# Patient Record
Sex: Male | Born: 1975 | Race: White | Hispanic: No | Marital: Single | Smoking: Former smoker
Health system: Southern US, Community
[De-identification: ages and names within clinical notes are randomized; demographics above are authoritative.]

## PROBLEM LIST (undated history)

## (undated) ENCOUNTER — Emergency Department (HOSPITAL_COMMUNITY): Admission: EM | Payer: No Typology Code available for payment source | Source: Home / Self Care

## (undated) DIAGNOSIS — M549 Dorsalgia, unspecified: Secondary | ICD-10-CM

## (undated) HISTORY — PX: OTHER SURGICAL HISTORY: SHX169

## (undated) HISTORY — PX: HAND SURGERY: SHX662

## (undated) HISTORY — PX: FRACTURE SURGERY: SHX138

## (undated) HISTORY — PX: ORTHOPEDIC SURGERY: SHX850

---

## 2006-04-30 ENCOUNTER — Emergency Department (HOSPITAL_COMMUNITY): Admission: EM | Admit: 2006-04-30 | Discharge: 2006-04-30 | Payer: Self-pay | Admitting: Emergency Medicine

## 2008-06-21 ENCOUNTER — Emergency Department (HOSPITAL_COMMUNITY): Admission: EM | Admit: 2008-06-21 | Discharge: 2008-06-21 | Payer: Self-pay | Admitting: Emergency Medicine

## 2009-07-22 ENCOUNTER — Emergency Department (HOSPITAL_COMMUNITY): Admission: EM | Admit: 2009-07-22 | Discharge: 2009-07-22 | Payer: Self-pay | Admitting: Emergency Medicine

## 2009-07-25 ENCOUNTER — Emergency Department (HOSPITAL_COMMUNITY): Admission: EM | Admit: 2009-07-25 | Discharge: 2009-07-26 | Payer: Self-pay | Admitting: Emergency Medicine

## 2009-12-11 ENCOUNTER — Emergency Department (HOSPITAL_COMMUNITY): Admission: EM | Admit: 2009-12-11 | Discharge: 2009-12-11 | Payer: Self-pay | Admitting: Emergency Medicine

## 2009-12-27 ENCOUNTER — Emergency Department (HOSPITAL_COMMUNITY): Admission: EM | Admit: 2009-12-27 | Discharge: 2009-12-27 | Payer: Self-pay | Admitting: Emergency Medicine

## 2010-01-27 ENCOUNTER — Emergency Department (HOSPITAL_COMMUNITY): Admission: EM | Admit: 2010-01-27 | Discharge: 2010-01-27 | Payer: Self-pay | Admitting: Emergency Medicine

## 2010-02-25 ENCOUNTER — Emergency Department (HOSPITAL_COMMUNITY): Admission: EM | Admit: 2010-02-25 | Discharge: 2010-02-25 | Payer: Self-pay | Admitting: Emergency Medicine

## 2010-03-25 ENCOUNTER — Emergency Department (HOSPITAL_COMMUNITY): Admission: EM | Admit: 2010-03-25 | Discharge: 2010-03-25 | Payer: Self-pay | Admitting: Emergency Medicine

## 2010-03-27 ENCOUNTER — Emergency Department (HOSPITAL_COMMUNITY): Admission: EM | Admit: 2010-03-27 | Discharge: 2010-03-27 | Payer: Self-pay | Admitting: Emergency Medicine

## 2010-04-03 ENCOUNTER — Emergency Department (HOSPITAL_COMMUNITY): Admission: EM | Admit: 2010-04-03 | Discharge: 2010-04-03 | Payer: Self-pay | Admitting: Emergency Medicine

## 2010-06-19 ENCOUNTER — Emergency Department (HOSPITAL_COMMUNITY): Admission: EM | Admit: 2010-06-19 | Discharge: 2010-06-19 | Payer: Self-pay | Admitting: Emergency Medicine

## 2010-07-10 ENCOUNTER — Emergency Department (HOSPITAL_COMMUNITY): Admission: EM | Admit: 2010-07-10 | Discharge: 2010-07-10 | Payer: Self-pay | Admitting: Emergency Medicine

## 2010-07-16 ENCOUNTER — Emergency Department (HOSPITAL_COMMUNITY): Admission: EM | Admit: 2010-07-16 | Discharge: 2010-07-16 | Payer: Self-pay | Admitting: Emergency Medicine

## 2010-08-12 ENCOUNTER — Emergency Department (HOSPITAL_COMMUNITY): Admission: EM | Admit: 2010-08-12 | Discharge: 2010-08-12 | Payer: Self-pay | Admitting: Emergency Medicine

## 2010-08-24 ENCOUNTER — Emergency Department (HOSPITAL_COMMUNITY): Admission: EM | Admit: 2010-08-24 | Discharge: 2010-08-24 | Payer: Self-pay | Admitting: Emergency Medicine

## 2010-09-21 ENCOUNTER — Emergency Department (HOSPITAL_COMMUNITY): Admission: EM | Admit: 2010-09-21 | Discharge: 2010-09-21 | Payer: Self-pay | Admitting: Emergency Medicine

## 2010-10-09 ENCOUNTER — Emergency Department (HOSPITAL_COMMUNITY)
Admission: EM | Admit: 2010-10-09 | Discharge: 2010-10-09 | Payer: Self-pay | Source: Home / Self Care | Admitting: Emergency Medicine

## 2010-10-14 ENCOUNTER — Emergency Department (HOSPITAL_COMMUNITY)
Admission: EM | Admit: 2010-10-14 | Discharge: 2010-10-14 | Payer: Self-pay | Source: Home / Self Care | Admitting: Emergency Medicine

## 2010-10-31 ENCOUNTER — Emergency Department (HOSPITAL_COMMUNITY)
Admission: EM | Admit: 2010-10-31 | Discharge: 2010-10-31 | Payer: Self-pay | Source: Home / Self Care | Admitting: Emergency Medicine

## 2010-11-30 ENCOUNTER — Emergency Department (HOSPITAL_COMMUNITY)
Admission: EM | Admit: 2010-11-30 | Discharge: 2010-11-30 | Payer: Self-pay | Source: Home / Self Care | Admitting: Emergency Medicine

## 2010-12-01 ENCOUNTER — Emergency Department (HOSPITAL_COMMUNITY)
Admission: EM | Admit: 2010-12-01 | Discharge: 2010-12-01 | Payer: Self-pay | Source: Home / Self Care | Admitting: Emergency Medicine

## 2010-12-08 ENCOUNTER — Emergency Department (HOSPITAL_COMMUNITY)
Admission: EM | Admit: 2010-12-08 | Discharge: 2010-12-08 | Payer: Self-pay | Source: Home / Self Care | Admitting: Emergency Medicine

## 2010-12-21 ENCOUNTER — Emergency Department (HOSPITAL_COMMUNITY)
Admission: EM | Admit: 2010-12-21 | Discharge: 2010-12-21 | Disposition: A | Discharge status: Home / Self Care | Payer: Self-pay | Attending: Emergency Medicine | Admitting: Emergency Medicine

## 2010-12-21 DIAGNOSIS — K029 Dental caries, unspecified: Secondary | ICD-10-CM | POA: Insufficient documentation

## 2010-12-21 DIAGNOSIS — K089 Disorder of teeth and supporting structures, unspecified: Secondary | ICD-10-CM | POA: Insufficient documentation

## 2011-02-09 LAB — POCT I-STAT, CHEM 8
BUN: 12 mg/dL (ref 6–23)
Calcium, Ion: 1.04 mmol/L — ABNORMAL LOW (ref 1.12–1.32)
Creatinine, Ser: 1.6 mg/dL — ABNORMAL HIGH (ref 0.4–1.5)
TCO2: 28 mmol/L (ref 0–100)

## 2011-02-09 LAB — URINALYSIS, ROUTINE W REFLEX MICROSCOPIC
Glucose, UA: NEGATIVE mg/dL
Hgb urine dipstick: NEGATIVE
Ketones, ur: NEGATIVE mg/dL
Protein, ur: NEGATIVE mg/dL

## 2011-03-01 ENCOUNTER — Emergency Department (HOSPITAL_COMMUNITY)
Admission: EM | Admit: 2011-03-01 | Discharge: 2011-03-01 | Disposition: A | Discharge status: Home / Self Care | Payer: Self-pay | Attending: Emergency Medicine | Admitting: Emergency Medicine

## 2011-03-01 DIAGNOSIS — K029 Dental caries, unspecified: Secondary | ICD-10-CM | POA: Insufficient documentation

## 2011-03-01 DIAGNOSIS — K089 Disorder of teeth and supporting structures, unspecified: Secondary | ICD-10-CM | POA: Insufficient documentation

## 2011-04-19 ENCOUNTER — Emergency Department (HOSPITAL_COMMUNITY)
Admission: EM | Admit: 2011-04-19 | Discharge: 2011-04-19 | Disposition: A | Discharge status: Home / Self Care | Payer: Self-pay | Attending: Emergency Medicine | Admitting: Emergency Medicine

## 2011-04-19 DIAGNOSIS — L0201 Cutaneous abscess of face: Secondary | ICD-10-CM | POA: Insufficient documentation

## 2011-04-19 DIAGNOSIS — L03211 Cellulitis of face: Secondary | ICD-10-CM | POA: Insufficient documentation

## 2011-04-21 ENCOUNTER — Emergency Department (HOSPITAL_COMMUNITY)
Admission: EM | Admit: 2011-04-21 | Discharge: 2011-04-21 | Disposition: A | Discharge status: Home / Self Care | Payer: Self-pay | Attending: Emergency Medicine | Admitting: Emergency Medicine

## 2011-04-21 DIAGNOSIS — L0201 Cutaneous abscess of face: Secondary | ICD-10-CM | POA: Insufficient documentation

## 2011-04-21 DIAGNOSIS — L03211 Cellulitis of face: Secondary | ICD-10-CM | POA: Insufficient documentation

## 2011-04-21 DIAGNOSIS — F17200 Nicotine dependence, unspecified, uncomplicated: Secondary | ICD-10-CM | POA: Insufficient documentation

## 2011-04-23 ENCOUNTER — Emergency Department (HOSPITAL_COMMUNITY)
Admission: EM | Admit: 2011-04-23 | Discharge: 2011-04-23 | Disposition: A | Discharge status: Home / Self Care | Payer: Self-pay | Attending: Emergency Medicine | Admitting: Emergency Medicine

## 2011-04-23 DIAGNOSIS — Z4800 Encounter for change or removal of nonsurgical wound dressing: Secondary | ICD-10-CM | POA: Insufficient documentation

## 2011-06-26 ENCOUNTER — Encounter: Payer: Self-pay | Admitting: *Deleted

## 2011-06-26 ENCOUNTER — Emergency Department (HOSPITAL_COMMUNITY)
Admission: EM | Admit: 2011-06-26 | Discharge: 2011-06-26 | Disposition: A | Discharge status: Home / Self Care | Payer: Worker's Compensation | Attending: Emergency Medicine | Admitting: Emergency Medicine

## 2011-06-26 ENCOUNTER — Emergency Department (HOSPITAL_COMMUNITY): Payer: Worker's Compensation

## 2011-06-26 DIAGNOSIS — W108XXA Fall (on) (from) other stairs and steps, initial encounter: Secondary | ICD-10-CM | POA: Insufficient documentation

## 2011-06-26 DIAGNOSIS — F17200 Nicotine dependence, unspecified, uncomplicated: Secondary | ICD-10-CM | POA: Insufficient documentation

## 2011-06-26 DIAGNOSIS — S3992XA Unspecified injury of lower back, initial encounter: Secondary | ICD-10-CM

## 2011-06-26 DIAGNOSIS — IMO0002 Reserved for concepts with insufficient information to code with codable children: Secondary | ICD-10-CM | POA: Insufficient documentation

## 2011-06-26 DIAGNOSIS — T148XXA Other injury of unspecified body region, initial encounter: Secondary | ICD-10-CM

## 2011-06-26 MED ORDER — OXYCODONE-ACETAMINOPHEN 5-325 MG PO TABS
1.0000 | ORAL_TABLET | Freq: Once | ORAL | Status: AC
  Administered 2011-06-26: 1 via ORAL
  Filled 2011-06-26: qty 1

## 2011-06-26 MED ORDER — KETOROLAC TROMETHAMINE 60 MG/2ML IM SOLN
60.0000 mg | Freq: Once | INTRAMUSCULAR | Status: AC
  Administered 2011-06-26: 60 mg via INTRAMUSCULAR
  Filled 2011-06-26: qty 2

## 2011-06-26 MED ORDER — IBUPROFEN 800 MG PO TABS: 800.0000 mg | ORAL_TABLET | Freq: Three times a day (TID) | ORAL | Status: AC

## 2011-06-26 MED ORDER — OXYCODONE-ACETAMINOPHEN 5-325 MG PO TABS: 1.0000 | ORAL_TABLET | ORAL | Status: AC | PRN

## 2011-06-26 NOTE — ED Notes (Signed)
Pt states slipped on steps falling backwards hitting lower back on edge of steps.  C/o pain to lower back.  C/o numbness to lower back, states is going away now.  Also c/o pain to right side of collarbone.

## 2011-06-26 NOTE — ED Provider Notes (Signed)
History     CSN: [id number] Arrival date & time: 06/26/2011  3:04 PM  Chief Complaint  Patient presents with  . Back Pain   Patient is a 35 y.o. male presenting with back pain. The history is provided by the patient.  Back Pain  This is a new problem. The current episode started 3 to 5 hours ago. The problem occurs constantly. The problem has not changed since onset.The pain is associated with falling (He slipped going down a step,  landing with his lower back against the step edge.  He also has pain in his right collarbone,  as right arm was hyperextended as he caught his fall.  He denies arm and hand pain.  He did not hit his head). The pain is present in the lumbar spine. The quality of the pain is described as aching. The pain does not radiate. The pain is at a severity of 10/10. The pain is severe. Exacerbated by: Lying flat back pain worse. Pertinent negatives include no chest pain, no fever, no numbness, no headaches, no abdominal pain, no dysuria, no leg pain, no paresthesias, no paresis, no tingling and no weakness. He has tried nothing for the symptoms.    History reviewed. No pertinent past medical history.  Past Surgical History  Procedure Date  . Collarbone     No family history on file.  History  Substance Use Topics  . Smoking status: Current Everyday Smoker  . Smokeless tobacco: Not on file  . Alcohol Use: No      Review of Systems  Constitutional: Negative for fever.  HENT: Negative for congestion, sore throat and neck pain.   Eyes: Negative.   Respiratory: Negative for chest tightness and shortness of breath.   Cardiovascular: Negative for chest pain.  Gastrointestinal: Negative for nausea and abdominal pain.  Genitourinary: Negative.  Negative for dysuria.  Musculoskeletal: Positive for back pain. Negative for joint swelling and arthralgias.  Skin: Negative.  Negative for rash and wound.  Neurological: Negative for dizziness, tingling, weakness,  light-headedness, numbness, headaches and paresthesias.  Hematological: Negative.   Psychiatric/Behavioral: Negative.     Physical Exam  BP 126/70  Pulse 72  Temp(Src) 98 F (36.7 C) (Oral)  Resp 20  Ht 6' (1.829 m)  Wt 245 lb (111.131 kg)  BMI 33.23 kg/m2  SpO2 100%  Physical Exam  Vitals reviewed. Constitutional: He is oriented to person, place, and time. He appears well-developed and well-nourished.  HENT:  Head: Normocephalic and atraumatic.  Eyes: Conjunctivae are normal.  Neck: Normal range of motion.  Cardiovascular: Normal rate, regular rhythm, normal heart sounds and intact distal pulses.   Pulmonary/Chest: Effort normal and breath sounds normal.  Abdominal: Soft. Bowel sounds are normal.  Musculoskeletal: Normal range of motion. He exhibits tenderness. He exhibits no edema.       Lumbar back: He exhibits tenderness, bony tenderness and pain. He exhibits no swelling, no edema, no deformity and no spasm.  Neurological: He is alert and oriented to person, place, and time. He has normal strength and normal reflexes. No sensory deficit. Gait normal.  Skin: Skin is warm and dry.  Psychiatric: He has a normal mood and affect.    ED Course  Procedures  MDM Negative acute injury  Upon review of films.  No visible trauma,  Suspect contusion injury.      Candis Musa, PA 06/26/11 1758

## 2011-06-27 NOTE — ED Provider Notes (Signed)
Evaluation and management procedures were performed by the mid-level provider (PA/NP/CNM) under my supervision/collaboration. I was present and available during the ED course. Marvin Lizaola Y.    [name]. [name], MD 06/27/11 [phone]

## 2011-07-11 NOTE — ED Provider Notes (Signed)
Evaluation and management procedures were performed by the mid-level provider (PA/NP/CNM) under my supervision/collaboration. I was present and available during the ED course. Sandon Yoho Y.   Malachy Coleman Y. Chancellor Vanderloop, MD 07/11/11 2148 

## 2011-07-15 ENCOUNTER — Emergency Department (HOSPITAL_COMMUNITY)
Admission: EM | Admit: 2011-07-15 | Discharge: 2011-07-15 | Disposition: A | Discharge status: Home / Self Care | Payer: Worker's Compensation | Attending: Emergency Medicine | Admitting: Emergency Medicine

## 2011-07-15 ENCOUNTER — Encounter (HOSPITAL_COMMUNITY): Payer: Self-pay

## 2011-07-15 DIAGNOSIS — S39012A Strain of muscle, fascia and tendon of lower back, initial encounter: Secondary | ICD-10-CM

## 2011-07-15 DIAGNOSIS — X500XXA Overexertion from strenuous movement or load, initial encounter: Secondary | ICD-10-CM | POA: Insufficient documentation

## 2011-07-15 DIAGNOSIS — M545 Low back pain, unspecified: Secondary | ICD-10-CM | POA: Insufficient documentation

## 2011-07-15 DIAGNOSIS — F17200 Nicotine dependence, unspecified, uncomplicated: Secondary | ICD-10-CM | POA: Insufficient documentation

## 2011-07-15 DIAGNOSIS — Y9269 Other specified industrial and construction area as the place of occurrence of the external cause: Secondary | ICD-10-CM | POA: Insufficient documentation

## 2011-07-15 MED ORDER — METHOCARBAMOL 500 MG PO TABS: ORAL_TABLET | ORAL | Status: DC

## 2011-07-15 MED ORDER — HYDROCODONE-ACETAMINOPHEN 5-325 MG PO TABS
2.0000 | ORAL_TABLET | Freq: Once | ORAL | Status: AC
  Administered 2011-07-15: 2 via ORAL
  Filled 2011-07-15: qty 2

## 2011-07-15 MED ORDER — PREDNISONE 10 MG PO TABS: ORAL_TABLET | ORAL | Status: DC

## 2011-07-15 MED ORDER — HYDROCODONE-ACETAMINOPHEN 5-325 MG PO TABS
ORAL_TABLET | ORAL | Status: DC
Start: 2011-07-15 — End: 2011-09-15

## 2011-07-15 MED ORDER — DIAZEPAM 5 MG PO TABS
5.0000 mg | ORAL_TABLET | Freq: Once | ORAL | Status: AC
  Administered 2011-07-15: 5 mg via ORAL
  Filled 2011-07-15: qty 1

## 2011-07-15 MED ORDER — ONDANSETRON HCL 4 MG PO TABS
4.0000 mg | ORAL_TABLET | Freq: Once | ORAL | Status: AC
  Administered 2011-07-15: 4 mg via ORAL
  Filled 2011-07-15: qty 1

## 2011-07-15 NOTE — ED Provider Notes (Signed)
History     CSN: [id number] Arrival date & time: 07/15/2011  3:32 PM  Chief Complaint  Patient presents with  . Back Pain   HPI Comments: Pt states he sustained an injury to the lower back. He was treated. Today he was carrying a heavy bag at work and when bending felt a pulling in  The lower back. He presents to ED for evalution and assistance with pain. He took 2 ibuprofen 600mg  tabs without relief.  Patient is a 35 y.o. male presenting with back pain. The history is provided by the patient.  Back Pain  This is a recurrent problem. The current episode started 1 to 2 hours ago. The problem occurs constantly. The problem has not changed since onset.The pain is associated with lifting heavy objects. The pain is present in the lumbar spine. The quality of the pain is described as aching. The pain is severe. The symptoms are aggravated by bending. Pertinent negatives include no chest pain, no abdominal pain, no bowel incontinence, no bladder incontinence and no dysuria. He has tried NSAIDs for the symptoms.    History reviewed. No pertinent past medical history.  Past Surgical History  Procedure Date  . Collarbone     No family history on file.  History  Substance Use Topics  . Smoking status: Current Everyday Smoker  . Smokeless tobacco: Not on file  . Alcohol Use: No      Review of Systems  Constitutional: Negative for activity change.       All ROS Neg except as noted in HPI  HENT: Negative for nosebleeds and neck pain.   Eyes: Negative for photophobia and discharge.  Respiratory: Negative for cough, shortness of breath and wheezing.   Cardiovascular: Negative for chest pain and palpitations.  Gastrointestinal: Negative for abdominal pain, blood in stool and bowel incontinence.  Genitourinary: Negative for bladder incontinence, dysuria, frequency and hematuria.  Musculoskeletal: Positive for back pain. Negative for arthralgias.  Skin: Negative.   Neurological: Negative for  dizziness, seizures and speech difficulty.  Psychiatric/Behavioral: Negative for hallucinations and confusion.    Physical Exam  BP 129/87  Pulse 74  Temp(Src) 98 F (36.7 C) (Oral)  Resp 16  Ht 6' (1.829 m)  Wt 245 lb (111.131 kg)  BMI 33.23 kg/m2  SpO2 100%  Physical Exam  Nursing note and vitals reviewed. Constitutional: He is oriented to person, place, and time. He appears well-developed and well-nourished.  Non-toxic appearance.  HENT:  Head: Normocephalic.  Right Ear: Tympanic membrane and external ear normal.  Left Ear: Tympanic membrane and external ear normal.  Eyes: EOM and lids are normal. Pupils are equal, round, and reactive to light.  Neck: Normal range of motion. Neck supple. Carotid bruit is not present.  Cardiovascular: Normal rate, regular rhythm, normal heart sounds, intact distal pulses and normal pulses.   Pulmonary/Chest: Breath sounds normal. No respiratory distress.  Abdominal: Soft. Bowel sounds are normal. There is no tenderness. There is no guarding.  Musculoskeletal: Normal range of motion.       Lumbar area pain with palpation or attempted ROM.  Lymphadenopathy:       Head (right side): No submandibular adenopathy present.       Head (left side): No submandibular adenopathy present.    He has no cervical adenopathy.  Neurological: He is alert and oriented to person, place, and time. He has normal strength. No cranial nerve deficit or sensory deficit. He exhibits normal muscle tone. Coordination normal.  Skin: Skin  is warm and dry.  Psychiatric: He has a normal mood and affect. His speech is normal.    ED Course:   Procedures  MDM Re-injury of the lumbar area. Suspect muscle strain. No gross neuro deficit.  Plan: ice pack. Muscle relaxant, anti-inflammatory, pain med. Orthopedic evaluation  (2nd back injury)    Kathie Dike, [place] 07/15/11 1656

## 2011-07-15 NOTE — ED Provider Notes (Signed)
Medical screening examination/treatment/procedure(s) were performed by non-physician practitioner and as supervising physician I was immediately available for consultation/collaboration.  Shelda Jakes, MD 07/15/11 [phone]

## 2011-07-15 NOTE — ED Notes (Signed)
Pt recently seen 3 weeks ago for workers comp for back injury. Pt states today he was bending over with concrete bag and went too far down and "felt something pull in my back" ambulates independently with no difficulty. Denies any gi/gu difficulty.

## 2011-07-24 ENCOUNTER — Encounter (HOSPITAL_COMMUNITY): Payer: Self-pay | Admitting: *Deleted

## 2011-07-24 ENCOUNTER — Emergency Department (HOSPITAL_COMMUNITY)
Admission: EM | Admit: 2011-07-24 | Discharge: 2011-07-24 | Disposition: A | Discharge status: Home / Self Care | Payer: Worker's Compensation | Attending: Emergency Medicine | Admitting: Emergency Medicine

## 2011-07-24 DIAGNOSIS — M545 Low back pain, unspecified: Secondary | ICD-10-CM | POA: Insufficient documentation

## 2011-07-24 DIAGNOSIS — F17200 Nicotine dependence, unspecified, uncomplicated: Secondary | ICD-10-CM | POA: Insufficient documentation

## 2011-07-24 DIAGNOSIS — M549 Dorsalgia, unspecified: Secondary | ICD-10-CM

## 2011-07-24 MED ORDER — HYDROCODONE-ACETAMINOPHEN 5-500 MG PO TABS: 1.0000 | ORAL_TABLET | Freq: Four times a day (QID) | ORAL | Status: AC | PRN

## 2011-07-24 MED ORDER — DEXAMETHASONE SODIUM PHOSPHATE 4 MG/ML IJ SOLN
8.0000 mg | Freq: Once | INTRAMUSCULAR | Status: AC
  Administered 2011-07-24: 8 mg via INTRAMUSCULAR
  Filled 2011-07-24: qty 2

## 2011-07-24 MED ORDER — DIAZEPAM 5 MG PO TABS
5.0000 mg | ORAL_TABLET | Freq: Once | ORAL | Status: DC
  Filled 2011-07-24: qty 1

## 2011-07-24 MED ORDER — DIAZEPAM 5 MG PO TABS: 5.0000 mg | ORAL_TABLET | Freq: Two times a day (BID) | ORAL | Status: AC

## 2011-07-24 MED ORDER — DEXAMETHASONE 6 MG PO TABS: 6.0000 mg | ORAL_TABLET | Freq: Two times a day (BID) | ORAL | Status: AC

## 2011-07-24 MED ORDER — TETANUS-DIPHTH-ACELL PERTUSSIS 5-2.5-18.5 LF-MCG/0.5 IM SUSP: 0.5000 mL | Freq: Once | INTRAMUSCULAR | Status: DC

## 2011-07-24 MED ORDER — DEXAMETHASONE 6 MG PO TABS: 6.0000 mg | ORAL_TABLET | Freq: Two times a day (BID) | ORAL | Status: DC

## 2011-07-24 NOTE — ED Provider Notes (Addendum)
Medical screening examination/treatment/procedure(s) were conducted as a shared visit with non-physician practitioner(s) and myself.  I personally evaluated the patient during the encounter   Andrew King, MD 07/31/11 1218 

## 2011-07-24 NOTE — ED Provider Notes (Signed)
History     CSN: [id number] Arrival date & time: 07/24/2011  5:36 PM  Chief Complaint  Patient presents with  . Back Pain   HPI Comments: This is one of several ED visits for increasing problem  With low back pain and spasm. Pt states it is hard for him to work. He has mood swings with his family. States the pain med is working less and less. He presents to ED for pain assistance.  Patient is a 35 y.o. male presenting with back pain. The history is provided by the patient.  Back Pain  This is a recurrent problem. The current episode started more than 1 week ago. The problem occurs daily. The problem has been gradually worsening. The pain is associated with lifting heavy objects. The pain is present in the lumbar spine. The quality of the pain is described as aching. The pain is at a severity of 9/10. The pain is severe. The symptoms are aggravated by certain positions. The pain is the same all the time. Pertinent negatives include no chest pain, no abdominal pain, no bowel incontinence, no bladder incontinence and no dysuria. He has tried NSAIDs, analgesics and muscle relaxants for the symptoms. The treatment provided mild relief.    History reviewed. No pertinent past medical history.  Past Surgical History  Procedure Date  . Collarbone     No family history on file.  History  Substance Use Topics  . Smoking status: Current Everyday Smoker -- 0.5 packs/day    Types: Cigarettes  . Smokeless tobacco: Not on file  . Alcohol Use: No      Review of Systems  Constitutional: Negative for activity change.       All ROS Neg except as noted in HPI  HENT: Negative for nosebleeds and neck pain.   Eyes: Negative for photophobia and discharge.  Respiratory: Negative for cough, shortness of breath and wheezing.   Cardiovascular: Negative for chest pain and palpitations.  Gastrointestinal: Negative for abdominal pain, blood in stool and bowel incontinence.  Genitourinary: Negative for  bladder incontinence, dysuria, frequency and hematuria.  Musculoskeletal: Positive for back pain. Negative for arthralgias.  Skin: Negative.   Neurological: Negative for dizziness, seizures and speech difficulty.  Psychiatric/Behavioral: Negative for hallucinations and confusion.    Physical Exam  BP 129/70  Pulse 75  Temp(Src) 98 F (36.7 C) (Oral)  Resp 20  Ht 6' (1.829 m)  Wt 245 lb (111.131 kg)  BMI 33.23 kg/m2  SpO2 100%  Physical Exam  Nursing note and vitals reviewed. Constitutional: He is oriented to person, place, and time. He appears well-developed and well-nourished.  Non-toxic appearance.  HENT:  Head: Normocephalic.  Right Ear: Tympanic membrane and external ear normal.  Left Ear: Tympanic membrane and external ear normal.  Eyes: EOM and lids are normal. Pupils are equal, round, and reactive to light.  Neck: Normal range of motion. Neck supple. Carotid bruit is not present.  Cardiovascular: Normal rate, regular rhythm, normal heart sounds, intact distal pulses and normal pulses.   Pulmonary/Chest: Breath sounds normal. No respiratory distress.  Abdominal: Soft. Bowel sounds are normal. There is no tenderness. There is no guarding.  Musculoskeletal: Normal range of motion.       Pain to palpation of the lumbar area. Pain with movement. No neuro changes. No muscle atrophy. No rash.  Lymphadenopathy:       Head (right side): No submandibular adenopathy present.       Head (left side): No submandibular adenopathy  present.    He has no cervical adenopathy.  Neurological: He is alert and oriented to person, place, and time. He has normal strength. No cranial nerve deficit or sensory deficit. He exhibits normal muscle tone. Coordination normal.       No motor or sensory deficit.  Skin: Skin is warm and dry.  Psychiatric: He has a normal mood and affect. His speech is normal.    ED Course:Discussed need for speciality evaluation. Discussed function of the ED. Pt states  that since he does not have a PCP he should be able to get treated for his pain at the ED. [facility]  Procedures  MDM I have reviewed nursing notes, vital signs, and all appropriate lab and imaging results for this patient. Suspect deep muscle strain vs sciatica (Left).      Kathie Dike, [place] 07/24/11 [phone]

## 2011-07-31 ENCOUNTER — Emergency Department (HOSPITAL_COMMUNITY)
Admission: EM | Admit: 2011-07-31 | Discharge: 2011-07-31 | Discharge status: Against Medical Advice | Payer: Worker's Compensation | Attending: Emergency Medicine | Admitting: Emergency Medicine

## 2011-07-31 ENCOUNTER — Encounter (HOSPITAL_COMMUNITY): Payer: Self-pay | Admitting: *Deleted

## 2011-07-31 DIAGNOSIS — M549 Dorsalgia, unspecified: Secondary | ICD-10-CM

## 2011-07-31 DIAGNOSIS — Z5320 Procedure and treatment not carried out because of patient's decision for unspecified reasons: Secondary | ICD-10-CM | POA: Insufficient documentation

## 2011-07-31 NOTE — ED Notes (Signed)
Back pain, states he is unable to work

## 2011-07-31 NOTE — ED Notes (Signed)
Unable to locate pt in all waiting areas x 3 

## 2011-08-11 ENCOUNTER — Emergency Department (HOSPITAL_COMMUNITY)
Admission: EM | Admit: 2011-08-11 | Discharge: 2011-08-11 | Disposition: A | Discharge status: Home / Self Care | Payer: Self-pay | Attending: Emergency Medicine | Admitting: Emergency Medicine

## 2011-08-11 ENCOUNTER — Encounter (HOSPITAL_COMMUNITY): Payer: Self-pay | Admitting: Emergency Medicine

## 2011-08-11 DIAGNOSIS — F17200 Nicotine dependence, unspecified, uncomplicated: Secondary | ICD-10-CM | POA: Insufficient documentation

## 2011-08-11 DIAGNOSIS — R51 Headache: Secondary | ICD-10-CM | POA: Insufficient documentation

## 2011-08-11 DIAGNOSIS — Z91038 Other insect allergy status: Secondary | ICD-10-CM | POA: Insufficient documentation

## 2011-08-11 DIAGNOSIS — S025XXA Fracture of tooth (traumatic), initial encounter for closed fracture: Secondary | ICD-10-CM

## 2011-08-11 DIAGNOSIS — K089 Disorder of teeth and supporting structures, unspecified: Secondary | ICD-10-CM | POA: Insufficient documentation

## 2011-08-11 DIAGNOSIS — Z880 Allergy status to penicillin: Secondary | ICD-10-CM | POA: Insufficient documentation

## 2011-08-11 DIAGNOSIS — K0381 Cracked tooth: Secondary | ICD-10-CM | POA: Insufficient documentation

## 2011-08-11 DIAGNOSIS — K029 Dental caries, unspecified: Secondary | ICD-10-CM | POA: Insufficient documentation

## 2011-08-11 HISTORY — DX: Dorsalgia, unspecified: M54.9

## 2011-08-11 MED ORDER — OXYCODONE-ACETAMINOPHEN 5-325 MG PO TABS: 2.0000 | ORAL_TABLET | ORAL | Status: AC | PRN

## 2011-08-11 MED ORDER — ERYTHROMYCIN BASE 250 MG PO TABS: ORAL_TABLET | ORAL | Status: DC

## 2011-08-11 MED ORDER — OXYCODONE-ACETAMINOPHEN 5-325 MG PO TABS
1.0000 | ORAL_TABLET | Freq: Once | ORAL | Status: AC
  Administered 2011-08-11: 1 via ORAL
  Filled 2011-08-11: qty 1

## 2011-08-11 MED ORDER — ERYTHROMYCIN BASE 250 MG PO TABS
500.0000 mg | ORAL_TABLET | Freq: Once | ORAL | Status: AC
  Administered 2011-08-11: 500 mg via ORAL
  Filled 2011-08-11: qty 2

## 2011-08-11 NOTE — ED Notes (Signed)
Asleep when I walked in room

## 2011-08-11 NOTE — ED Notes (Signed)
Patient c/o front upper tooth pain and wisdom tooth pain.

## 2011-08-11 NOTE — ED Provider Notes (Signed)
History     CSN: [id number] Arrival date & time: 08/11/2011  4:01 AM  Chief Complaint  Patient presents with  . Dental Pain    HPI  (Consider location/radiation/quality/duration/timing/severity/associated sxs/prior treatment)  Patient is a 35 y.o. male presenting with tooth pain. The history is provided by the patient and medical records.  Dental PainThe primary symptoms include headaches. Primary symptoms do not include fever or shortness of breath.   The patient is a 35 year old male, who smokes cigarettes.  He complains of a broken tooth causing pain.  He denies fevers, chills, nausea or vomiting, or difficulty breathing.  He has no other symptoms. Past Medical History  Diagnosis Date  . Back pain     Past Surgical History  Procedure Date  . Collarbone     No family history on file.  History  Substance Use Topics  . Smoking status: Current Everyday Smoker -- 0.5 packs/day    Types: Cigarettes  . Smokeless tobacco: Not on file  . Alcohol Use: No      Review of Systems  Review of Systems  Constitutional: Negative for fever and chills.  HENT: Negative for neck pain.        Toothache  Eyes: Positive for redness.  Respiratory: Negative for shortness of breath.   Gastrointestinal: Negative for nausea and vomiting.  Skin: Negative for rash.  Neurological: Positive for headaches.       Headache    Allergies  Penicillins and Bee venom  Home Medications   Current Outpatient Rx  Name Route Sig Dispense Refill  . BC HEADACHE POWDER PO Oral Take 1 packet by mouth daily as needed. For pain     . HYDROCODONE-ACETAMINOPHEN 5-325 MG PO TABS  1 or 2 po q4h prn pain 20 tablet 0  . IBUPROFEN 600 MG PO TABS Oral Take 600 mg by mouth every 6 (six) hours as needed. Back Pain     . METHOCARBAMOL 500 MG PO TABS  2 po tid for spasm 30 tablet 0  . OVER THE COUNTER MEDICATION Oral Take 1 tablet by mouth daily. Hydroxycut     . PREDNISONE 10 MG PO TABS  6,5,4,3,2,1 - take with  food 21 tablet 0    Physical Exam    BP 143/77  Pulse 62  Temp(Src) 98.2 F (36.8 C) (Oral)  Resp 18  Ht 6' (1.829 m)  Wt 247 lb (112.038 kg)  BMI 33.50 kg/m2  SpO2 97%  Physical Exam  Constitutional: He is oriented to person, place, and time. He appears well-developed and well-nourished.  HENT:  Head: Normocephalic and atraumatic.       Broken #14 tooth with tenderness to palpation  Musculoskeletal: Normal range of motion.  Neurological: He is alert and oriented to person, place, and time.  Skin: Skin is warm and dry.  Psychiatric: He has a normal mood and affect.    ED Course  Procedures (including critical care time)  Labs Reviewed - No data to display No results found.   No diagnosis found.   MDM Broken, #14 tooth with dental caries.  No signs of abscesses or respiratory compromise        Nicholes Stairs, MD 08/11/11 [phone]

## 2011-08-20 ENCOUNTER — Emergency Department (HOSPITAL_COMMUNITY)
Admission: EM | Admit: 2011-08-20 | Discharge: 2011-08-20 | Disposition: A | Discharge status: Home / Self Care | Payer: Self-pay | Attending: Emergency Medicine | Admitting: Emergency Medicine

## 2011-08-20 ENCOUNTER — Encounter (HOSPITAL_COMMUNITY): Payer: Self-pay | Admitting: Emergency Medicine

## 2011-08-20 DIAGNOSIS — Z79899 Other long term (current) drug therapy: Secondary | ICD-10-CM | POA: Insufficient documentation

## 2011-08-20 DIAGNOSIS — K089 Disorder of teeth and supporting structures, unspecified: Secondary | ICD-10-CM | POA: Insufficient documentation

## 2011-08-20 DIAGNOSIS — F17200 Nicotine dependence, unspecified, uncomplicated: Secondary | ICD-10-CM | POA: Insufficient documentation

## 2011-08-20 DIAGNOSIS — K047 Periapical abscess without sinus: Secondary | ICD-10-CM | POA: Insufficient documentation

## 2011-08-20 MED ORDER — ERYTHROMYCIN BASE 250 MG PO TABS: 250.0000 mg | ORAL_TABLET | Freq: Four times a day (QID) | ORAL | Status: AC

## 2011-08-20 MED ORDER — HYDROCODONE-ACETAMINOPHEN 5-325 MG PO TABS: 1.0000 | ORAL_TABLET | Freq: Once | ORAL | Status: DC

## 2011-08-20 MED ORDER — KETOROLAC TROMETHAMINE 60 MG/2ML IM SOLN
60.0000 mg | Freq: Once | INTRAMUSCULAR | Status: AC
  Administered 2011-08-20: 60 mg via INTRAMUSCULAR
  Filled 2011-08-20: qty 2

## 2011-08-20 MED ORDER — HYDROCODONE-ACETAMINOPHEN 5-325 MG PO TABS
1.0000 | ORAL_TABLET | Freq: Once | ORAL | Status: AC
  Administered 2011-08-20: 1 via ORAL
  Filled 2011-08-20: qty 1

## 2011-08-20 MED ORDER — HYDROCODONE-ACETAMINOPHEN 5-325 MG PO TABS: 1.0000 | ORAL_TABLET | ORAL | Status: AC | PRN

## 2011-08-20 MED ORDER — IBUPROFEN 800 MG PO TABS: 800.0000 mg | ORAL_TABLET | Freq: Three times a day (TID) | ORAL | Status: AC

## 2011-08-20 NOTE — ED Provider Notes (Signed)
History     CSN: [id number] Arrival date & time: 08/20/2011 11:11 AM  Chief Complaint  Patient presents with  . Dental Pain    (Consider location/radiation/quality/duration/timing/severity/associated sxs/prior treatment) Patient is a 35 y.o. male presenting with tooth pain. The history is provided by the patient.  Dental PainThe primary symptoms include mouth pain. Primary symptoms do not include headaches, fever, shortness of breath or sore throat. Primary symptoms comment: He had a wisdom tooth that was decayed and causing pain,  and was partially fractured.  He pulled the tooth  3 days ago,  but root is still present.  He has increased pain and swelling.  States it drained blood and pus early this am.   The symptoms began 3 to 5 days ago. The symptoms are worsening. The symptoms occur constantly.  Additional symptoms include: gum swelling and gum tenderness. Additional symptoms do not include: facial swelling, trouble swallowing and drooling. Associated symptoms comments: He was seen for similar symptoms on 08/11/11,  But did not get his antibiotic filled..    Past Medical History  Diagnosis Date  . Back pain     Past Surgical History  Procedure Date  . Collarbone     History reviewed. No pertinent family history.  History  Substance Use Topics  . Smoking status: Current Everyday Smoker -- 0.5 packs/day    Types: Cigarettes  . Smokeless tobacco: Not on file  . Alcohol Use: No      Review of Systems  Constitutional: Negative for fever.  HENT: Positive for dental problem. Negative for congestion, sore throat, facial swelling, drooling, mouth sores, trouble swallowing, neck pain, voice change and sinus pressure.   Eyes: Negative.   Respiratory: Negative for chest tightness and shortness of breath.   Cardiovascular: Negative for chest pain.  Gastrointestinal: Negative for nausea and abdominal pain.  Genitourinary: Negative.   Musculoskeletal: Negative for joint swelling  and arthralgias.  Skin: Negative.  Negative for rash and wound.  Neurological: Negative for dizziness, weakness, light-headedness, numbness and headaches.  Hematological: Negative.   Psychiatric/Behavioral: Negative.     Allergies  Penicillins and Bee venom  Home Medications   Current Outpatient Rx  Name Route Sig Dispense Refill  . ACETAMINOPHEN-CODEINE #3 300-30 MG PO TABS Oral Take 1 tablet by mouth every 4 (four) hours as needed. Tooth ache     . BC HEADACHE POWDER PO Oral Take 1 packet by mouth daily as needed. For pain     . ERYTHROMYCIN BASE 250 MG PO TABS  Take 1 by mouth 4 times a day 20 tablet 0  . ERYTHROMYCIN BASE 250 MG PO TABS Oral Take 1 tablet (250 mg total) by mouth every 6 (six) hours. 28 tablet 0  . HYDROCODONE-ACETAMINOPHEN 5-325 MG PO TABS  1 or 2 po q4h prn pain 20 tablet 0  . HYDROCODONE-ACETAMINOPHEN 5-325 MG PO TABS Oral Take 1 tablet by mouth once. 15 tablet 0  . IBUPROFEN 600 MG PO TABS Oral Take 600 mg by mouth every 6 (six) hours as needed. Back Pain     . IBUPROFEN 800 MG PO TABS Oral Take 1 tablet (800 mg total) by mouth 3 (three) times daily. 21 tablet 0  . METHOCARBAMOL 500 MG PO TABS  2 po tid for spasm 30 tablet 0  . OVER THE COUNTER MEDICATION Oral Take 1 tablet by mouth daily. Hydroxycut     . OXYCODONE-ACETAMINOPHEN 5-325 MG PO TABS Oral Take 2 tablets by mouth every 4 (four) hours as  needed for pain. 15 tablet 0  . PREDNISONE 10 MG PO TABS  6,5,4,3,2,1 - take with food 21 tablet 0    BP 139/83  Pulse 78  Temp(Src) 98.5 F (36.9 C) (Oral)  Resp 18  SpO2 100%  Physical Exam  Constitutional: He is oriented to person, place, and time. He appears well-developed and well-nourished. No distress.  HENT:  Head: Normocephalic and atraumatic.  Right Ear: Tympanic membrane and external ear normal.  Left Ear: Tympanic membrane and external ear normal.  Mouth/Throat: Oropharynx is clear and moist and mucous membranes are normal. No oral lesions.  Dental abscesses present.    Eyes: Conjunctivae are normal.  Neck: Normal range of motion. Neck supple.  Cardiovascular: Normal rate and normal heart sounds.   Pulmonary/Chest: Effort normal.  Abdominal: He exhibits no distension.  Musculoskeletal: Normal range of motion.  Lymphadenopathy:    He has no cervical adenopathy.  Neurological: He is alert and oriented to person, place, and time.  Skin: Skin is warm and dry. No erythema.  Psychiatric: He has a normal mood and affect.    ED Course  Procedures (including critical care time)  Labs Reviewed - No data to display No results found.   1. Dental abscess       MDM  Dental abscess.  Noncompliance with previous tx plan.  Discussed need to dentist and referrals given.        Candis Musa, PA 08/20/11 1149

## 2011-08-20 NOTE — ED Provider Notes (Signed)
Medical screening examination/treatment/procedure(s) were performed by non-physician practitioner and as supervising physician I was immediately available for consultation/collaboration.   Joya Gaskins, MD 08/20/11 [phone]

## 2011-08-20 NOTE — ED Notes (Signed)
Pain to r lower dental pain. Nad. No swelling noted.

## 2011-08-29 NOTE — ED Provider Notes (Signed)
Pt left AMA prior to eval.  Worthy Rancher, PA 08/29/11 1402

## 2011-09-04 NOTE — ED Provider Notes (Signed)
Reviewed and agree  Dayton Bailiff, MD 09/04/11 [phone]

## 2011-09-15 ENCOUNTER — Encounter (HOSPITAL_COMMUNITY): Payer: Self-pay

## 2011-09-15 ENCOUNTER — Emergency Department (HOSPITAL_COMMUNITY)
Admission: EM | Admit: 2011-09-15 | Discharge: 2011-09-15 | Disposition: A | Discharge status: Home / Self Care | Payer: Self-pay | Attending: Emergency Medicine | Admitting: Emergency Medicine

## 2011-09-15 DIAGNOSIS — M79609 Pain in unspecified limb: Secondary | ICD-10-CM | POA: Insufficient documentation

## 2011-09-15 DIAGNOSIS — M7742 Metatarsalgia, left foot: Secondary | ICD-10-CM

## 2011-09-15 DIAGNOSIS — S39012A Strain of muscle, fascia and tendon of lower back, initial encounter: Secondary | ICD-10-CM

## 2011-09-15 DIAGNOSIS — M7750 Other enthesopathy of unspecified foot: Secondary | ICD-10-CM | POA: Insufficient documentation

## 2011-09-15 DIAGNOSIS — M545 Low back pain, unspecified: Secondary | ICD-10-CM | POA: Insufficient documentation

## 2011-09-15 MED ORDER — CYCLOBENZAPRINE HCL 10 MG PO TABS: 10.0000 mg | ORAL_TABLET | Freq: Two times a day (BID) | ORAL | Status: AC | PRN

## 2011-09-15 MED ORDER — OXYCODONE-ACETAMINOPHEN 5-325 MG PO TABS
1.0000 | ORAL_TABLET | Freq: Once | ORAL | Status: AC
  Administered 2011-09-15: 1 via ORAL
  Filled 2011-09-15: qty 1

## 2011-09-15 MED ORDER — ETODOLAC 500 MG PO TABS: 500.0000 mg | ORAL_TABLET | Freq: Two times a day (BID) | ORAL | Status: DC

## 2011-09-15 NOTE — ED Notes (Signed)
Pt reports severe pain to his lower back and left foot.  Pt reports having his foot xray'd here last year and we were unable to determine if it was broke or not.  Pt reports inability to work d/t all of his pain.

## 2011-09-15 NOTE — ED Notes (Signed)
Pt requesting to see Dr. Lynelle Doctor for percocet pain medication to go home with. Dr. Lynelle Doctor in room with pt at this time to discuss with pt.
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
## 2011-10-13 ENCOUNTER — Emergency Department (HOSPITAL_COMMUNITY)
Admission: EM | Admit: 2011-10-13 | Discharge: 2011-10-13 | Disposition: A | Discharge status: Home / Self Care | Payer: Self-pay | Attending: Emergency Medicine | Admitting: Emergency Medicine

## 2011-10-13 ENCOUNTER — Encounter (HOSPITAL_COMMUNITY): Payer: Self-pay | Admitting: Emergency Medicine

## 2011-10-13 DIAGNOSIS — K089 Disorder of teeth and supporting structures, unspecified: Secondary | ICD-10-CM | POA: Insufficient documentation

## 2011-10-13 DIAGNOSIS — F17200 Nicotine dependence, unspecified, uncomplicated: Secondary | ICD-10-CM | POA: Insufficient documentation

## 2011-10-13 DIAGNOSIS — K0889 Other specified disorders of teeth and supporting structures: Secondary | ICD-10-CM

## 2011-10-13 DIAGNOSIS — K029 Dental caries, unspecified: Secondary | ICD-10-CM | POA: Insufficient documentation

## 2011-10-13 DIAGNOSIS — K0381 Cracked tooth: Secondary | ICD-10-CM | POA: Insufficient documentation

## 2011-10-13 MED ORDER — CLINDAMYCIN HCL 150 MG PO CAPS: ORAL_CAPSULE | ORAL | Status: DC

## 2011-10-13 MED ORDER — HYDROCODONE-ACETAMINOPHEN 5-325 MG PO TABS
1.0000 | ORAL_TABLET | Freq: Once | ORAL | Status: AC
  Administered 2011-10-13: 1 via ORAL
  Filled 2011-10-13: qty 1

## 2011-10-13 MED ORDER — CLINDAMYCIN HCL 150 MG PO CAPS
300.0000 mg | ORAL_CAPSULE | Freq: Once | ORAL | Status: AC
  Administered 2011-10-13: 300 mg via ORAL
  Filled 2011-10-13: qty 2

## 2011-10-13 MED ORDER — HYDROCODONE-ACETAMINOPHEN 5-325 MG PO TABS: ORAL_TABLET | ORAL | Status: DC

## 2011-10-13 MED ORDER — IBUPROFEN 800 MG PO TABS
800.0000 mg | ORAL_TABLET | Freq: Once | ORAL | Status: AC
  Administered 2011-10-13: 800 mg via ORAL
  Filled 2011-10-13: qty 1

## 2011-10-13 NOTE — ED Notes (Signed)
Pt complaining of broke wisdom tooth and front tooth pain since this am.

## 2011-10-13 NOTE — ED Provider Notes (Signed)
History     CSN: [id number] Arrival date & time: 10/13/2011  8:12 AM   First MD Initiated Contact with Patient 10/13/11 [phone]      Chief Complaint  Patient presents with  . Dental Pain    (Consider location/radiation/quality/duration/timing/severity/associated sxs/prior treatment) HPI Comments: Multiple caries.  Broke L upper second molar several days ago eating a pork chop sandwich.  Has appt with a dentist in 8 days.  Patient is a 35 y.o. male presenting with tooth pain. The history is provided by the patient. No language interpreter was used.  Dental PainThe primary symptoms include mouth pain and dental injury. Primary symptoms do not include oral bleeding or fever. The symptoms began 5 to 7 days ago. The symptoms are worsening. The symptoms occur constantly.  Additional symptoms include: dental sensitivity to temperature and gum swelling. Additional symptoms do not include: facial swelling. Medical issues include: periodontal disease. Medical issues do not include: immunosuppression.    Past Medical History  Diagnosis Date  . Back pain     Past Surgical History  Procedure Date  . Collarbone     History reviewed. No pertinent family history.  History  Substance Use Topics  . Smoking status: Current Everyday Smoker -- 0.5 packs/day    Types: Cigarettes  . Smokeless tobacco: Not on file  . Alcohol Use: No      Review of Systems  Constitutional: Negative for fever.  HENT: Positive for dental problem. Negative for facial swelling.   All other systems reviewed and are negative.    Allergies  Penicillins and Bee venom  Home Medications   Current Outpatient Rx  Name Route Sig Dispense Refill  . ASPIRIN-ACETAMINOPHEN 500-325 MG PO PACK Oral Take 1 packet by mouth as needed. For pain     . DIPHENHYDRAMINE-APAP (SLEEP) 38-500 MG PO PACK Oral Take 1 packet by mouth as needed.      . ETODOLAC 500 MG PO TABS Oral Take 1 tablet (500 mg total) by mouth 2 (two) times  daily. 20 tablet 0    BP 130/107  Resp 18  Ht 6' (1.829 m)  Wt 258 lb (117.028 kg)  BMI 34.99 kg/m2  SpO2 98%  Physical Exam  Nursing note and vitals reviewed. Constitutional: He is oriented to person, place, and time. He appears well-developed and well-nourished. No distress.  HENT:  Head: Normocephalic and atraumatic.  Mouth/Throat: Oropharynx is clear and moist and mucous membranes are normal. Dental caries present. No uvula swelling.    Eyes: EOM are normal.  Neck: Normal range of motion.  Cardiovascular: Normal rate, regular rhythm, normal heart sounds and intact distal pulses.   Pulmonary/Chest: Effort normal and breath sounds normal. No respiratory distress.  Abdominal: Soft. He exhibits no distension. There is no tenderness.  Musculoskeletal: Normal range of motion.  Lymphadenopathy:    He has no cervical adenopathy.  Neurological: He is alert and oriented to person, place, and time.  Skin: Skin is warm and dry. He is not diaphoretic.  Psychiatric: He has a normal mood and affect. Judgment normal.    ED Course  Procedures (including critical care time)  Labs Reviewed - No data to display No results found.   No diagnosis found.    MDM          Worthy Rancher, PA 10/13/11 [phone]

## 2011-10-13 NOTE — ED Provider Notes (Signed)
Medical screening examination/treatment/procedure(s) were performed by non-physician practitioner and as supervising physician I was immediately available for consultation/collaboration.  [name], MD 10/13/11 1030

## 2011-10-13 NOTE — ED Notes (Signed)
Pt requesting something to drink. Water given.

## 2011-10-19 ENCOUNTER — Emergency Department (HOSPITAL_COMMUNITY)
Admission: EM | Admit: 2011-10-19 | Discharge: 2011-10-19 | Disposition: A | Discharge status: Home / Self Care | Payer: Self-pay | Attending: Emergency Medicine | Admitting: Emergency Medicine

## 2011-10-19 ENCOUNTER — Encounter (HOSPITAL_COMMUNITY): Payer: Self-pay | Admitting: *Deleted

## 2011-10-19 DIAGNOSIS — F17200 Nicotine dependence, unspecified, uncomplicated: Secondary | ICD-10-CM | POA: Insufficient documentation

## 2011-10-19 DIAGNOSIS — K029 Dental caries, unspecified: Secondary | ICD-10-CM | POA: Insufficient documentation

## 2011-10-19 MED ORDER — KETOROLAC TROMETHAMINE 10 MG PO TABS
10.0000 mg | ORAL_TABLET | Freq: Once | ORAL | Status: AC
  Administered 2011-10-19: 10 mg via ORAL
  Filled 2011-10-19: qty 1

## 2011-10-19 MED ORDER — PROMETHAZINE HCL 12.5 MG PO TABS
12.5000 mg | ORAL_TABLET | Freq: Once | ORAL | Status: AC
  Administered 2011-10-19: 12.5 mg via ORAL
  Filled 2011-10-19: qty 1

## 2011-10-19 MED ORDER — CIPROFLOXACIN HCL 500 MG PO TABS: ORAL_TABLET | ORAL | Status: DC

## 2011-10-19 MED ORDER — BUTALBITAL-APAP-CAFF-COD 50-325-40-30 MG PO CAPS: 1.0000 | ORAL_CAPSULE | ORAL | Status: AC | PRN

## 2011-10-19 MED ORDER — HYDROCODONE-ACETAMINOPHEN 5-325 MG PO TABS
2.0000 | ORAL_TABLET | Freq: Once | ORAL | Status: AC
  Administered 2011-10-19: 2 via ORAL
  Filled 2011-10-19: qty 2

## 2011-10-19 MED ORDER — KETOROLAC TROMETHAMINE 10 MG PO TABS: 10.0000 mg | ORAL_TABLET | Freq: Four times a day (QID) | ORAL | Status: AC | PRN

## 2011-10-19 MED ORDER — CIPROFLOXACIN HCL 250 MG PO TABS
500.0000 mg | ORAL_TABLET | Freq: Once | ORAL | Status: AC
  Administered 2011-10-19: 500 mg via ORAL
  Filled 2011-10-19: qty 2

## 2011-10-19 NOTE — ED Notes (Signed)
Patient upset that he did not get a prescription for Vicodin, explained to pt that he got two prescriptions for pain, still not happy and wanted to speak with supervisor, J. Verner Mould, RN went in pt's room and spoke with pt, pt then left

## 2011-10-19 NOTE — ED Notes (Signed)
Pt speaking with wife on phone at nurses desk

## 2011-10-19 NOTE — ED Provider Notes (Signed)
History     CSN: [id number] Arrival date & time: 10/19/2011 12:21 PM   First MD Initiated Contact with Patient 10/19/11 1403      Chief Complaint  Patient presents with  . Dental Pain    (Consider location/radiation/quality/duration/timing/severity/associated sxs/prior treatment) HPI Comments: Pt states he has had several visits to ED concerning his teeth. He returns today because he has swelling of the left jaw and pain that is not controlled by ibuprofen or tylenol or oragel.  He feels that the medications given him on 11/27 are not working because he is getting worse.  Patient is a 35 y.o. male presenting with tooth pain. The history is provided by the patient.  Dental PainThe primary symptoms include mouth pain and headaches. Primary symptoms do not include fever, shortness of breath or cough. The symptoms began more than 1 week ago. The symptoms are worsening. The symptoms are recurrent. The symptoms occur constantly.  The headache is not associated with photophobia.  Additional symptoms include: dental sensitivity to temperature, gum swelling, jaw pain and facial swelling. Additional symptoms do not include: trouble swallowing and nosebleeds. Medical issues include: smoking.    Past Medical History  Diagnosis Date  . Back pain     Past Surgical History  Procedure Date  . Collarbone     No family history on file.  History  Substance Use Topics  . Smoking status: Current Everyday Smoker -- 0.5 packs/day    Types: Cigarettes  . Smokeless tobacco: Not on file  . Alcohol Use: No      Review of Systems  Constitutional: Negative for fever and activity change.       All ROS Neg except as noted in HPI  HENT: Positive for facial swelling. Negative for nosebleeds, trouble swallowing and neck pain.   Eyes: Negative for photophobia and discharge.  Respiratory: Negative for cough, shortness of breath and wheezing.   Cardiovascular: Negative for chest pain and palpitations.    Gastrointestinal: Negative for abdominal pain and blood in stool.  Genitourinary: Negative for dysuria, frequency and hematuria.  Musculoskeletal: Negative for back pain and arthralgias.  Skin: Negative.   Neurological: Positive for headaches. Negative for dizziness, seizures and speech difficulty.  Psychiatric/Behavioral: Negative for hallucinations and confusion.    Allergies  Penicillins and Bee venom  Home Medications   Current Outpatient Rx  Name Route Sig Dispense Refill  . CLINDAMYCIN HCL 150 MG PO CAPS Oral Take 300 mg by mouth 3 (three) times daily. Stopped taking yesterday due to allergic reaction (he thinks)     . DIPHENHYDRAMINE-APAP (SLEEP) 38-500 MG PO PACK Oral Take 1 packet by mouth as needed.        BP 118/60  Pulse 76  Temp(Src) 98.1 F (36.7 C) (Oral)  Resp 20  Ht 6' (1.829 m)  Wt 248 lb (112.492 kg)  BMI 33.63 kg/m2  SpO2 100%  Physical Exam  Nursing note and vitals reviewed. Constitutional: He is oriented to person, place, and time. He appears well-developed and well-nourished.  Non-toxic appearance.  HENT:  Head: Normocephalic.  Right Ear: Tympanic membrane and external ear normal.  Left Ear: Tympanic membrane and external ear normal.       Multiple dental caries. Deep cavity of the left upper and lower jaw. Mild to mod swelling. No visible abscess.   Eyes: EOM and lids are normal. Pupils are equal, round, and reactive to light.  Neck: Normal range of motion. Neck supple. Carotid bruit is not present.  Cardiovascular:  Normal rate, regular rhythm, normal heart sounds, intact distal pulses and normal pulses.   No murmur heard. Pulmonary/Chest: Breath sounds normal. No respiratory distress.  Abdominal: Soft. Bowel sounds are normal. There is no tenderness. There is no guarding.  Musculoskeletal: Normal range of motion.  Lymphadenopathy:       Head (right side): No submandibular adenopathy present.       Head (left side): No submandibular adenopathy  present.    He has no cervical adenopathy.  Neurological: He is alert and oriented to person, place, and time. He has normal strength. No cranial nerve deficit or sensory deficit.  Skin: Skin is warm and dry.  Psychiatric: He has a normal mood and affect. His speech is normal.    ED Course: Discussed with pt the need to see a dentist as soon as possible as we do not have dental services in the ED.   Procedures (including critical care time)  Labs Reviewed - No data to display No results found.   No diagnosis found.    MDM  I have reviewed nursing notes, vital signs, and all appropriate lab and imaging results for this patient.        Kathie Dike, [place] 10/19/11 [phone]

## 2011-10-19 NOTE — ED Notes (Signed)
Pt states he was here 11/27 and given antibiotics and pain med for swelling and tooth pain. Pt states he stopped taking ax last night due to severe diarrhea and that the pain med is not strong enough. (given Norco for pain)

## 2011-10-19 NOTE — ED Notes (Signed)
Patient with left lower dental pain, tooth broken and discolored, pt seen for same recently and was given prescription for pain meds and antibiotic, pt stopped taking antibiotic due to GI upset and has ran out of pain meds, pt denies making an appt for dentist

## 2011-10-20 NOTE — ED Provider Notes (Signed)
Medical screening examination/treatment/procedure(s) were performed by non-physician practitioner and as supervising physician I was immediately available for consultation/collaboration.  Nicoletta Dress. Colon Branch, MD 10/20/11 [phone]

## 2011-11-23 ENCOUNTER — Emergency Department (HOSPITAL_COMMUNITY): Payer: Self-pay

## 2011-11-23 ENCOUNTER — Emergency Department (HOSPITAL_COMMUNITY)
Admission: EM | Admit: 2011-11-23 | Discharge: 2011-11-23 | Disposition: A | Discharge status: Home / Self Care | Payer: Self-pay | Attending: Emergency Medicine | Admitting: Emergency Medicine

## 2011-11-23 ENCOUNTER — Encounter (HOSPITAL_COMMUNITY): Payer: Self-pay | Admitting: *Deleted

## 2011-11-23 DIAGNOSIS — S46909A Unspecified injury of unspecified muscle, fascia and tendon at shoulder and upper arm level, unspecified arm, initial encounter: Secondary | ICD-10-CM | POA: Insufficient documentation

## 2011-11-23 DIAGNOSIS — M25519 Pain in unspecified shoulder: Secondary | ICD-10-CM | POA: Insufficient documentation

## 2011-11-23 DIAGNOSIS — IMO0002 Reserved for concepts with insufficient information to code with codable children: Secondary | ICD-10-CM | POA: Insufficient documentation

## 2011-11-23 DIAGNOSIS — R209 Unspecified disturbances of skin sensation: Secondary | ICD-10-CM | POA: Insufficient documentation

## 2011-11-23 DIAGNOSIS — S4980XA Other specified injuries of shoulder and upper arm, unspecified arm, initial encounter: Secondary | ICD-10-CM | POA: Insufficient documentation

## 2011-11-23 DIAGNOSIS — M549 Dorsalgia, unspecified: Secondary | ICD-10-CM | POA: Insufficient documentation

## 2011-11-23 DIAGNOSIS — F17200 Nicotine dependence, unspecified, uncomplicated: Secondary | ICD-10-CM | POA: Insufficient documentation

## 2011-11-23 DIAGNOSIS — M25419 Effusion, unspecified shoulder: Secondary | ICD-10-CM | POA: Insufficient documentation

## 2011-11-23 MED ORDER — NAPROXEN 500 MG PO TABS: 500.0000 mg | ORAL_TABLET | Freq: Two times a day (BID) | ORAL | Status: DC

## 2011-11-23 MED ORDER — OXYCODONE-ACETAMINOPHEN 5-325 MG PO TABS: 1.0000 | ORAL_TABLET | ORAL | Status: AC | PRN

## 2011-11-23 MED ORDER — OXYCODONE-ACETAMINOPHEN 5-325 MG PO TABS
1.0000 | ORAL_TABLET | Freq: Once | ORAL | Status: AC
  Administered 2011-11-23: 1 via ORAL
  Filled 2011-11-23: qty 1

## 2011-11-23 NOTE — ED Provider Notes (Signed)
History     CSN: [id number]  Arrival date & time 11/23/11  1520   First MD Initiated Contact with Patient 11/23/11 1707      Chief Complaint  Patient presents with  . Shoulder Injury    (Consider location/radiation/quality/duration/timing/severity/associated sxs/prior treatment) HPI Comments: patinet co pain , numbness and tingling sensation to his right shoulder and upper arm.  Sx's began after he was using a chainsaw and the chainsaw "kicked back" and struck him in the shoulder.  He states he cannot abduction the right arm due to pain.  He denies other injuries.  Has hx of previous surgical repair the the right clavicle   Patient is a 36 y.o. male presenting with shoulder injury. The history is provided by the patient.  Shoulder Injury This is a new problem. The current episode started today. The problem occurs constantly. The problem has been unchanged. Associated symptoms include arthralgias, joint swelling and numbness. Pertinent negatives include no chest pain, headaches, myalgias, neck pain or weakness. Associated symptoms comments: Tingling to the upper right arm. Exacerbated by: movement and palpation. He has tried nothing for the symptoms. The treatment provided no relief.    Past Medical History  Diagnosis Date  . Back pain     Past Surgical History  Procedure Date  . Collarbone   . Orthopedic surgery     No family history on file.  History  Substance Use Topics  . Smoking status: Current Everyday Smoker -- 0.5 packs/day    Types: Cigarettes  . Smokeless tobacco: Not on file  . Alcohol Use: No      Review of Systems  HENT: Negative for neck pain.   Respiratory: Negative for shortness of breath.   Cardiovascular: Negative for chest pain.  Musculoskeletal: Positive for back pain, joint swelling and arthralgias. Negative for myalgias.  Skin: Negative.  Negative for wound.  Neurological: Positive for numbness. Negative for dizziness, weakness and headaches.    All other systems reviewed and are negative.    Allergies  Penicillins and Bee venom  Home Medications   Current Outpatient Rx  Name Route Sig Dispense Refill  . CIPROFLOXACIN HCL 500 MG PO TABS  1 po bid with food 14 tablet 0  . CLINDAMYCIN HCL 150 MG PO CAPS Oral Take 300 mg by mouth 3 (three) times daily. Stopped taking yesterday due to allergic reaction (he thinks)     . DIPHENHYDRAMINE-APAP (SLEEP) 38-500 MG PO PACK Oral Take 1 packet by mouth as needed.        BP 122/89  Pulse 93  Temp(Src) 98 F (36.7 C) (Oral)  Resp 22  Ht 6' (1.829 m)  Wt 260 lb (117.935 kg)  BMI 35.26 kg/m2  SpO2 99%  Physical Exam  Nursing note and vitals reviewed. Constitutional: He is oriented to person, place, and time. He appears well-developed and well-nourished. No distress.  HENT:  Head: Normocephalic and atraumatic.  Mouth/Throat: Oropharynx is clear and moist.  Eyes: EOM are normal. Pupils are equal, round, and reactive to light.  Neck: Normal range of motion. Neck supple.  Cardiovascular: Normal rate, regular rhythm and normal heart sounds.   Pulmonary/Chest: Effort normal and breath sounds normal. No respiratory distress. He exhibits no tenderness.  Musculoskeletal: He exhibits edema and tenderness.       Right shoulder: He exhibits decreased range of motion, tenderness, swelling and pain. He exhibits no effusion, no crepitus, no deformity, no laceration, normal pulse and normal strength.  Arms:      ttp of the anterior right shoulder.  No obvious deformity .  Pain reproduced with < 20 degrees of abduction and with rotation of the shoulder  Lymphadenopathy:    He has no cervical adenopathy.  Neurological: He is alert and oriented to person, place, and time. No cranial nerve deficit. He exhibits normal muscle tone. Coordination normal.  Skin: Skin is warm and dry.    ED Course  SPLINT APPLICATION Performed by: Lizbet Cirrincione L. Authorized by: Maxwell Caul Consent:  Verbal consent obtained. Written consent not obtained. Consent given by: patient Patient understanding: patient states understanding of the procedure being performed Patient consent: the patient's understanding of the procedure matches consent given Imaging studies: imaging studies available Patient identity confirmed: verbally with patient Time out: Immediately prior to procedure a "time out" was called to verify the correct patient, procedure, equipment, support staff and site/side marked as required. Location: right shoulder. Supplies used: sling. Post-procedure: The splinted body part was neurovascularly unchanged following the procedure. Patient tolerance: Patient tolerated the procedure well with no immediate complications.   (including critical care time)  Dg Shoulder Right  11/23/2011  *RADIOLOGY REPORT*  Clinical Data: Shoulder injury  RIGHT SHOULDER - 2+ VIEW  Comparison: 06/26/2011  Findings: Plate and screws are noted within the clavicle.  No acute fracture or dislocation.  No breakage or loosening of the hardware.  IMPRESSION: No acute bony pathology.  Original Report Authenticated By: Donavan Burnet, M.D.        MDM     ttp of the anterior right shoulder.  Pain is reproduced with abduction of the arm and with rotation of the right shoulder.  Sensation intact, radial pulse is brisk.  CR<2 sec.  Will place him in a sling.  Pt prefers to f/u with Dr. Hilda Lias.       Abbiegail Landgren L. Jaxsen Bernhart, [place] 11/25/11 1310

## 2011-11-23 NOTE — ED Notes (Signed)
States he was using a chain saw and hit himself in the shoulder,now has numbness in right arm and cannot left his arm

## 2011-11-23 NOTE — ED Notes (Signed)
[name] PA in prior to RN, see PA assessment for further 

## 2011-11-26 NOTE — ED Provider Notes (Signed)
Medical screening examination/treatment/procedure(s) were performed by non-physician practitioner and as supervising physician I was immediately available for consultation/collaboration.  Ethelda Chick, MD 11/26/11 1318

## 2012-01-03 ENCOUNTER — Encounter (HOSPITAL_COMMUNITY): Payer: Self-pay

## 2012-01-03 ENCOUNTER — Emergency Department (HOSPITAL_COMMUNITY)
Admission: EM | Admit: 2012-01-03 | Discharge: 2012-01-03 | Disposition: A | Discharge status: Home / Self Care | Payer: Self-pay | Attending: Emergency Medicine | Admitting: Emergency Medicine

## 2012-01-03 DIAGNOSIS — R1110 Vomiting, unspecified: Secondary | ICD-10-CM | POA: Insufficient documentation

## 2012-01-03 DIAGNOSIS — K047 Periapical abscess without sinus: Secondary | ICD-10-CM | POA: Insufficient documentation

## 2012-01-03 DIAGNOSIS — R220 Localized swelling, mass and lump, head: Secondary | ICD-10-CM | POA: Insufficient documentation

## 2012-01-03 DIAGNOSIS — K029 Dental caries, unspecified: Secondary | ICD-10-CM | POA: Insufficient documentation

## 2012-01-03 DIAGNOSIS — F17200 Nicotine dependence, unspecified, uncomplicated: Secondary | ICD-10-CM | POA: Insufficient documentation

## 2012-01-03 DIAGNOSIS — K1370 Unspecified lesions of oral mucosa: Secondary | ICD-10-CM | POA: Insufficient documentation

## 2012-01-03 DIAGNOSIS — R221 Localized swelling, mass and lump, neck: Secondary | ICD-10-CM | POA: Insufficient documentation

## 2012-01-03 DIAGNOSIS — K089 Disorder of teeth and supporting structures, unspecified: Secondary | ICD-10-CM | POA: Insufficient documentation

## 2012-01-03 MED ORDER — OXYCODONE-ACETAMINOPHEN 5-325 MG PO TABS
1.0000 | ORAL_TABLET | Freq: Once | ORAL | Status: AC
  Administered 2012-01-03: 1 via ORAL
  Filled 2012-01-03: qty 1

## 2012-01-03 MED ORDER — CLINDAMYCIN HCL 300 MG PO CAPS: ORAL_CAPSULE | ORAL | Status: DC

## 2012-01-03 MED ORDER — CLINDAMYCIN HCL 150 MG PO CAPS
300.0000 mg | ORAL_CAPSULE | Freq: Once | ORAL | Status: AC
  Administered 2012-01-03: 300 mg via ORAL
  Filled 2012-01-03: qty 2

## 2012-01-03 MED ORDER — OXYCODONE-ACETAMINOPHEN 5-325 MG PO TABS: 1.0000 | ORAL_TABLET | ORAL | Status: AC | PRN

## 2012-01-03 NOTE — ED Notes (Signed)
Pt states that his wisdom tooth is broken all to the gums.

## 2012-01-03 NOTE — Discharge Instructions (Signed)
Dental Abscess °A dental abscess usually starts from an infected tooth. Antibiotic medicine and pain pills can be helpful, but dental infections require the attention of a dentist. Rinse around the infected area often with salt water (a pinch of salt in 8 oz of warm water). Do not apply heat to the outside of your face. See your dentist or oral surgeon as soon as possible.  °SEEK IMMEDIATE MEDICAL CARE IF: °· You have increasing, severe pain that is not relieved by medicine.  °· You or your child has an oral temperature above 102° F (38.9° C), not controlled by medicine.  °· Your baby is older than 3 months with a rectal temperature of 102° F (38.9° C) or higher.  °· Your baby is 3 months old or younger with a rectal temperature of 100.4° F (38° C) or higher.  °· You develop chills, severe headache, difficulty breathing, or trouble swallowing.  °· You have swelling in the neck or around the eye.  °Document Released: 11/02/2005 Document Revised: 07/15/2011 Document Reviewed: 04/13/2007 °[employer]® Patient Information ©2012 [employer]. °

## 2012-01-03 NOTE — ED Provider Notes (Signed)
History     CSN: [id number]  Arrival date & time 01/03/12  1300   First MD Initiated Contact with Patient 01/03/12 1315      Chief Complaint  Patient presents with  . Dental Pain    (Consider location/radiation/quality/duration/timing/severity/associated sxs/prior treatment) HPI Comments: Patient complains of pain to the left lower third molar. Pain is present for several days. He also noticed swelling of the left side of his face it began on the day prior to ED arrival.  Patient has history of dental decay.  He states he is waiting for an appointment with his dentist.  He denies difficulty swallowing, neck pain, fever, or bleeding of his gums.  Patient is a 36 y.o. male presenting with tooth pain. The history is provided by the patient. No language interpreter was used.  Dental PainThe primary symptoms include mouth pain. Primary symptoms do not include dental injury, oral bleeding, oral lesions, headaches, fever, shortness of breath, sore throat, angioedema or cough. The symptoms began 3 to 5 days ago. The symptoms are worsening. The symptoms occur constantly.  Mouth pain occurs constantly. Mouth pain is unchanged. Affected locations include: teeth and gum(s). The mouth pain is currently at 10/10.  Additional symptoms include: dental sensitivity to temperature, gum swelling, gum tenderness and facial swelling. Additional symptoms do not include: purulent gums, trismus, trouble swallowing, pain with swallowing, drooling and ear pain. Medical issues include: periodontal disease.    Past Medical History  Diagnosis Date  . Back pain     Past Surgical History  Procedure Date  . Collarbone   . Orthopedic surgery     No family history on file.  History  Substance Use Topics  . Smoking status: Current Everyday Smoker -- 0.5 packs/day    Types: Cigarettes  . Smokeless tobacco: Not on file  . Alcohol Use: No      Review of Systems  Constitutional: Negative for fever, activity  change and appetite change.  HENT: Positive for facial swelling and dental problem. Negative for ear pain, sore throat, drooling and trouble swallowing.   Eyes: Negative for pain.  Respiratory: Negative for cough and shortness of breath.   Gastrointestinal: Positive for vomiting. Negative for nausea and abdominal pain.  Musculoskeletal: Negative.   Skin: Negative.   Neurological: Negative for numbness and headaches.  Hematological: Negative for adenopathy.  All other systems reviewed and are negative.    Allergies  Penicillins and Bee venom  Home Medications   Current Outpatient Rx  Name Route Sig Dispense Refill  . DOXYCYCLINE MONOHYDRATE 100 MG PO TABS Oral Take 100 mg by mouth 2 (two) times daily.      BP 155/80  Pulse 89  Temp(Src) 97.9 F (36.6 C) (Oral)  Resp 20  Ht 6' (1.829 m)  Wt 250 lb (113.399 kg)  BMI 33.91 kg/m2  SpO2 100%  Physical Exam  Nursing note and vitals reviewed. Constitutional: He is oriented to person, place, and time. He appears well-developed and well-nourished. No distress.  HENT:  Head: Normocephalic and atraumatic. No trismus in the jaw.  Right Ear: Tympanic membrane and ear canal normal.  Left Ear: Tympanic membrane and ear canal normal.  Nose: Nose normal.  Mouth/Throat: Uvula is midline, oropharynx is clear and moist and mucous membranes are normal. He does not have dentures. No oral lesions. Abnormal dentition. Dental abscesses and dental caries present. No uvula swelling or lacerations.         ttp of the left lower third molar.  Tooth has decayed down to the gumline.  Moderate edema and erythema of the surrounding gums.    Neck: Trachea normal and normal range of motion. Neck supple.  Cardiovascular: Normal rate, regular rhythm and normal heart sounds.   No murmur heard. Pulmonary/Chest: Effort normal and breath sounds normal. No respiratory distress.  Musculoskeletal: Normal range of motion. He exhibits no tenderness.    Lymphadenopathy:    He has no cervical adenopathy.  Neurological: He is alert and oriented to person, place, and time. He exhibits normal muscle tone. Coordination normal.  Skin: Skin is warm and dry.  Psychiatric: He has a normal mood and affect.    ED Course  Procedures (including critical care time     MDM    Dental decay of the left lower third molar, moderate swelling and erythema of the surrounding gums. Mild facial swelling on the left.  Likely developing dental abscess.  No trismus,  he is nontoxic appearing.  He agrees to close followup with a dentist        Errin Whitelaw L. [place], [place] 01/05/12 [phone]

## 2012-01-03 NOTE — ED Notes (Signed)
Pt had left lower wisdom tooth break off and has been on antibiotics for it, states that it is not any better, denies seeing a dentist for it

## 2012-01-05 NOTE — ED Provider Notes (Signed)
Medical screening examination/treatment/procedure(s) were performed by non-physician practitioner and as supervising physician I was immediately available for consultation/collaboration.  Samari Bittinger S. Arfa Lamarca, MD 01/05/12 1501 

## 2012-02-09 ENCOUNTER — Emergency Department (HOSPITAL_COMMUNITY)
Admission: EM | Admit: 2012-02-09 | Discharge: 2012-02-09 | Disposition: A | Discharge status: Home Health | Payer: Self-pay | Attending: Emergency Medicine | Admitting: Emergency Medicine

## 2012-02-09 ENCOUNTER — Encounter (HOSPITAL_COMMUNITY): Payer: Self-pay | Admitting: *Deleted

## 2012-02-09 DIAGNOSIS — G8929 Other chronic pain: Secondary | ICD-10-CM | POA: Insufficient documentation

## 2012-02-09 DIAGNOSIS — M549 Dorsalgia, unspecified: Secondary | ICD-10-CM | POA: Insufficient documentation
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
## 2012-02-09 NOTE — ED Notes (Signed)
Pt lwbs after triage

## 2012-02-22 ENCOUNTER — Emergency Department (HOSPITAL_COMMUNITY)
Admission: EM | Admit: 2012-02-22 | Discharge: 2012-02-22 | Disposition: A | Discharge status: Home / Self Care | Payer: Self-pay | Attending: Emergency Medicine | Admitting: Emergency Medicine

## 2012-02-22 ENCOUNTER — Encounter (HOSPITAL_COMMUNITY): Payer: Self-pay | Admitting: Emergency Medicine

## 2012-02-22 DIAGNOSIS — M549 Dorsalgia, unspecified: Secondary | ICD-10-CM | POA: Insufficient documentation

## 2012-02-22 MED ORDER — CYCLOBENZAPRINE HCL 10 MG PO TABS: 10.0000 mg | ORAL_TABLET | Freq: Three times a day (TID) | ORAL | Status: AC | PRN

## 2012-02-22 MED ORDER — OXYCODONE-ACETAMINOPHEN 5-325 MG PO TABS
1.0000 | ORAL_TABLET | Freq: Once | ORAL | Status: AC
  Administered 2012-02-22: 1 via ORAL
  Filled 2012-02-22: qty 1

## 2012-02-22 MED ORDER — OXYCODONE-ACETAMINOPHEN 5-325 MG PO TABS: 1.0000 | ORAL_TABLET | ORAL | Status: AC | PRN

## 2012-02-22 MED ORDER — IBUPROFEN 400 MG PO TABS
400.0000 mg | ORAL_TABLET | Freq: Once | ORAL | Status: AC
  Administered 2012-02-22: 400 mg via ORAL
  Filled 2012-02-22: qty 1

## 2012-02-22 NOTE — ED Notes (Signed)
Pt c/o chronic back pain, pt seen in ed last week for same

## 2012-02-22 NOTE — Discharge Instructions (Signed)
Chronic Back Pain When back pain lasts longer than 3 months, it is called chronic back pain.This pain can be frustrating, but the cause of the pain is rarely dangerous.People with chronic back pain often go through certain periods that are more intense (flare-ups). CAUSES Chronic back pain can be caused by wear and tear (degeneration) on different structures in your back. These structures may include bones, ligaments, or discs. This degeneration may result in more pressure being placed on the nerves that travel to your legs and feet. This can lead to pain traveling from the low back down the back of the legs. When pain lasts longer than 3 months, it is not unusual for people to experience anxiety or depression. Anxiety and depression can also contribute to low back pain. TREATMENT  Establish a regular exercise plan. This is critical to improving your functional level.   Have a self-management plan for when you flare-up. Flare-ups rarely require a medical visit. Regular exercise will help reduce the intensity and frequency of your flare-ups.   Manage how you feel about your back pain and the rest of your life. Anxiety, depression, and feeling that you cannot alter your back pain have been shown to make back pain more intense and debilitating.   Medicines should never be your only treatment. They should be used along with other treatments to help you return to a more active lifestyle.   Procedures such as injections or surgery may be helpful but are rarely necessary. You may be able to get the same results with physical therapy or chiropractic care.  HOME CARE INSTRUCTIONS  Avoid bending, heavy lifting, prolonged sitting, and activities which make the problem worse.   Continue normal activity as much as possible.   Take brief periods of rest throughout the day to reduce your pain during flare-ups.   Follow your back exercise rehabilitation program. This can help reduce symptoms and prevent  more pain.   Only take over-the-counter or prescription medicines as directed by your caregiver. Muscle relaxants are sometimes prescribed. Narcotic pain medicine is discouraged for long-term pain, since addiction is a possible outcome.   If you smoke, quit.   Eat healthy foods and maintain a recommended body weight.  SEEK IMMEDIATE MEDICAL CARE IF:   You have weakness or numbness in one of your legs or feet.   You have trouble controlling your bladder or bowels.   You develop nausea, vomiting, abdominal pain, shortness of breath, or fainting.  Document Released: 12/10/2004 Document Revised: 10/22/2011 Document Reviewed: 10/17/2011 [facility] Patient Information 2012 [place], [place].

## 2012-02-22 NOTE — ED Provider Notes (Signed)
History     CSN: [id number]  Arrival date & time 02/22/12  1005   First MD Initiated Contact with Patient 02/22/12 1115      Chief Complaint  Patient presents with  . Back Pain    (Consider location/radiation/quality/duration/timing/severity/associated sxs/prior treatment) HPI Comments: Patient with a history of chronic low back pain complains of increasing pain for several days.  Patient is a 36 y.o. male presenting with back pain. The history is provided by the patient.  Back Pain  This is a chronic problem. The current episode started more than 2 days ago. The problem occurs constantly. The problem has been gradually worsening. The pain is associated with twisting (Plan basketball). The pain is present in the lumbar spine and sacro-iliac joint. The quality of the pain is described as shooting and aching. The pain radiates to the left thigh. The pain is moderate. The symptoms are aggravated by twisting and certain positions. The pain is the same all the time. Pertinent negatives include no chest pain, no fever, no numbness, no dysuria and no weakness. He has tried nothing for the symptoms. The treatment provided no relief.    Past Medical History  Diagnosis Date  . Back pain     Past Surgical History  Procedure Date  . Collarbone   . Orthopedic surgery     No family history on file.  History  Substance Use Topics  . Smoking status: Current Everyday Smoker -- 0.5 packs/day    Types: Cigarettes  . Smokeless tobacco: Not on file  . Alcohol Use: No      Review of Systems  Constitutional: Negative for fever.  Cardiovascular: Negative for chest pain.  Genitourinary: Negative for dysuria, decreased urine volume and testicular pain.  Musculoskeletal: Positive for back pain. Negative for gait problem.  Skin: Negative.   Neurological: Negative for weakness and numbness.  All other systems reviewed and are negative.    Allergies  Penicillins and Bee venom  Home  Medications   Current Outpatient Rx  Name Route Sig Dispense Refill  . CLINDAMYCIN HCL 300 MG PO CAPS  One capsule po QID x 10 days 40 capsule 0  . DOXYCYCLINE MONOHYDRATE 100 MG PO TABS Oral Take 100 mg by mouth 2 (two) times daily.      BP 128/80  Pulse 77  Temp 98.1 F (36.7 C)  Resp 18  Ht 6' (1.829 m)  Wt 243 lb (110.224 kg)  BMI 32.96 kg/m2  SpO2 98%  Physical Exam  Nursing note and vitals reviewed. Constitutional: He is oriented to person, place, and time. He appears well-developed and well-nourished. No distress.  HENT:  Head: Normocephalic and atraumatic.  Mouth/Throat: Oropharynx is clear and moist.  Neck: Neck supple.  Cardiovascular: Normal rate, regular rhythm, normal heart sounds and intact distal pulses.   No murmur heard. Pulmonary/Chest: Effort normal and breath sounds normal. No respiratory distress.  Musculoskeletal: Normal range of motion. He exhibits tenderness. He exhibits no edema.       Lumbar back: He exhibits tenderness and pain. He exhibits normal range of motion, no bony tenderness, no swelling, no laceration and normal pulse.       Back:       ttp of the lumbar paraspinal muscles  Lymphadenopathy:    He has cervical adenopathy.  Neurological: He is alert and oriented to person, place, and time. No sensory deficit. He exhibits normal muscle tone. Coordination and gait normal.  Reflex Scores:      Patellar  reflexes are 2+ on the right side and 2+ on the left side.      Achilles reflexes are 2+ on the right side and 2+ on the left side. Skin: Skin is warm and dry.    ED Course  Procedures (including critical care time)       MDM   previous ed charts and nursing notes were reviewed by me, po percocet and ibuprofen given   Vital signs are stable. Patient is alert. No focal neuro deficits. He is tablet tori with a steady gait. He has tenderness to palpation of the lumbar paraspinal muscles and left SI joint space. Patient was here last  week but left before being seen. He states he has a appointment in 3 days with Dr. Hilda Lias.  Patient has a history of chronic back pain. I've explained to the patient that the ER cannot manage his chronic pain  Patient / Family / Caregiver understand and agree with initial ED impression and plan with expectations set for ED visit. Pt stable in ED with no significant deterioration in condition. Pt feels improved after observation and/or treatment in ED.    Iwalani Templeton L. [place], [place] 02/24/12 2201

## 2012-02-26 ENCOUNTER — Emergency Department (HOSPITAL_COMMUNITY): Admission: EM | Admit: 2012-02-26 | Discharge: 2012-02-26 | Discharge status: Against Medical Advice | Payer: Self-pay

## 2012-02-26 ENCOUNTER — Encounter (HOSPITAL_COMMUNITY): Payer: Self-pay | Admitting: Emergency Medicine

## 2012-02-26 NOTE — ED Notes (Signed)
Pt left without signing the ama paperwork.

## 2012-02-26 NOTE — ED Provider Notes (Signed)
Medical screening examination/treatment/procedure(s) were performed by non-physician practitioner and as supervising physician I was immediately available for consultation/collaboration.   Benny Lennert, MD 02/26/12 [phone]

## 2012-02-26 NOTE — ED Notes (Signed)
Pt was seen for back pain last week and states he could not afford to follow up with the orthopedic.

## 2012-02-28 ENCOUNTER — Encounter (HOSPITAL_COMMUNITY): Payer: Self-pay

## 2012-02-28 ENCOUNTER — Emergency Department (HOSPITAL_COMMUNITY)
Admission: EM | Admit: 2012-02-28 | Discharge: 2012-02-28 | Disposition: A | Discharge status: Home / Self Care | Payer: Self-pay | Attending: Emergency Medicine | Admitting: Emergency Medicine

## 2012-02-28 DIAGNOSIS — F17200 Nicotine dependence, unspecified, uncomplicated: Secondary | ICD-10-CM | POA: Insufficient documentation

## 2012-02-28 DIAGNOSIS — M545 Low back pain, unspecified: Secondary | ICD-10-CM | POA: Insufficient documentation

## 2012-02-28 DIAGNOSIS — G8929 Other chronic pain: Secondary | ICD-10-CM

## 2012-02-28 MED ORDER — METHOCARBAMOL 500 MG PO TABS: ORAL_TABLET | ORAL | Status: DC

## 2012-02-28 MED ORDER — OXYCODONE-ACETAMINOPHEN 5-325 MG PO TABS
2.0000 | ORAL_TABLET | Freq: Once | ORAL | Status: AC
  Administered 2012-02-28: 2 via ORAL
  Filled 2012-02-28: qty 2

## 2012-02-28 NOTE — ED Provider Notes (Signed)
History     CSN: [id number]  Arrival date & time 02/28/12  1708   First MD Initiated Contact with Patient 02/28/12 1823      Chief Complaint  Patient presents with  . Back Pain    (Consider location/radiation/quality/duration/timing/severity/associated sxs/prior treatment) HPI Comments: Patient has hx of chronic low back pain and was recently treated here for same.  Returns c/o continued pain and states he does not have the finances to f/u with anyone.  He denies new or changing symptoms.    Patient is a 36 y.o. male presenting with back pain. The history is provided by the patient.  Back Pain  This is a chronic problem. The current episode started more than 1 week ago. The problem occurs constantly. The problem has not changed since onset.The pain is associated with no known injury. The pain is present in the lumbar spine. The quality of the pain is described as aching. The pain does not radiate. The pain is moderate. The symptoms are aggravated by bending, twisting and certain positions. The pain is the same all the time. Pertinent negatives include no chest pain, no fever, no numbness, no abdominal pain, no abdominal swelling, no bowel incontinence, no perianal numbness, no bladder incontinence, no dysuria, no pelvic pain, no leg pain, no paresthesias, no paresis, no tingling and no weakness. He has tried muscle relaxants and analgesics for the symptoms. The treatment provided mild relief.    Past Medical History  Diagnosis Date  . Back pain     Past Surgical History  Procedure Date  . Collarbone   . Orthopedic surgery     History reviewed. No pertinent family history.  History  Substance Use Topics  . Smoking status: Current Everyday Smoker -- 0.5 packs/day    Types: Cigarettes  . Smokeless tobacco: Not on file  . Alcohol Use: No      Review of Systems  Constitutional: Negative for fever.  HENT: Negative for neck pain and neck stiffness.   Cardiovascular: Negative  for chest pain.  Gastrointestinal: Negative for abdominal pain and bowel incontinence.  Genitourinary: Negative for bladder incontinence, dysuria, flank pain, decreased urine volume and pelvic pain.  Musculoskeletal: Positive for back pain.  Neurological: Negative for tingling, weakness, numbness and paresthesias.  All other systems reviewed and are negative.    Allergies  Penicillins and Bee venom  Home Medications   Current Outpatient Rx  Name Route Sig Dispense Refill  . CLINDAMYCIN HCL 300 MG PO CAPS  One capsule po QID x 10 days 40 capsule 0  . CYCLOBENZAPRINE HCL 10 MG PO TABS Oral Take 1 tablet (10 mg total) by mouth 3 (three) times daily as needed for muscle spasms. 21 tablet 0  . DOXYCYCLINE MONOHYDRATE 100 MG PO TABS Oral Take 100 mg by mouth 2 (two) times daily.    . OXYCODONE-ACETAMINOPHEN 5-325 MG PO TABS Oral Take 1 tablet by mouth every 4 (four) hours as needed for pain. 10 tablet 0    BP 111/64  Pulse 85  Temp(Src) 97.5 F (36.4 C) (Oral)  Resp 20  Ht 6' (1.829 m)  Wt 242 lb (109.77 kg)  BMI 32.82 kg/m2  SpO2 100%  Physical Exam  Nursing note and vitals reviewed. Constitutional: He is oriented to person, place, and time. He appears well-developed and well-nourished. No distress.  HENT:  Head: Normocephalic and atraumatic.  Cardiovascular: Normal rate, regular rhythm and normal heart sounds.   Pulmonary/Chest: Effort normal and breath sounds normal. No respiratory  distress.  Musculoskeletal: Normal range of motion. He exhibits no tenderness.       Lumbar back: He exhibits tenderness. He exhibits normal range of motion, no bony tenderness, no swelling and normal pulse.       Back:       ttp of the lumbar paraspinal muscles  Neurological: He is alert and oriented to person, place, and time. No cranial nerve deficit or sensory deficit. He exhibits normal muscle tone. Coordination and gait normal.  Reflex Scores:      Patellar reflexes are 2+ on the right side  and 2+ on the left side.      Achilles reflexes are 2+ on the right side and 2+ on the left side. Skin: Skin is warm and dry.    ED Course  Procedures (including critical care time)      MDM    Patient has tenderness to palpation of the lumbar paraspinal muscles. No focal neuro deficits on exam. He ambulates with a slow but steady gait. Patient has frequent ER visits for multiple complaints including chronic back pain. I saw this patient on 02/22/2012 for similar symptoms. He was advised to to followup with Dr. Hilda Lias patient states that he did follow up but was referred to pain management and he does not have the finances for the co-pay.  I will give the patient chronic pain instructions and advised him that he will need further management through the pain clinic for his chronic back pain he agrees to the care plan and verbalized understanding of the instructions  Pailynn Vahey L. [place], [place] 03/04/12 0002

## 2012-02-28 NOTE — ED Notes (Signed)
Pt presents with low back pain x 6 months. Pt states he is unable to follow up with doctor keeling due to 175.00 co pay.

## 2012-02-28 NOTE — Discharge Instructions (Signed)
Chronic Back Pain When back pain lasts longer than 3 months, it is called chronic back pain.This pain can be frustrating, but the cause of the pain is rarely dangerous.People with chronic back pain often go through certain periods that are more intense (flare-ups). CAUSES Chronic back pain can be caused by wear and tear (degeneration) on different structures in your back. These structures may include bones, ligaments, or discs. This degeneration may result in more pressure being placed on the nerves that travel to your legs and feet. This can lead to pain traveling from the low back down the back of the legs. When pain lasts longer than 3 months, it is not unusual for people to experience anxiety or depression. Anxiety and depression can also contribute to low back pain. TREATMENT  Establish a regular exercise plan. This is critical to improving your functional level.   Have a self-management plan for when you flare-up. Flare-ups rarely require a medical visit. Regular exercise will help reduce the intensity and frequency of your flare-ups.   Manage how you feel about your back pain and the rest of your life. Anxiety, depression, and feeling that you cannot alter your back pain have been shown to make back pain more intense and debilitating.   Medicines should never be your only treatment. They should be used along with other treatments to help you return to a more active lifestyle.   Procedures such as injections or surgery may be helpful but are rarely necessary. You may be able to get the same results with physical therapy or chiropractic care.  HOME CARE INSTRUCTIONS  Avoid bending, heavy lifting, prolonged sitting, and activities which make the problem worse.   Continue normal activity as much as possible.   Take brief periods of rest throughout the day to reduce your pain during flare-ups.   Follow your back exercise rehabilitation program. This can help reduce symptoms and prevent  more pain.   Only take over-the-counter or prescription medicines as directed by your caregiver. Muscle relaxants are sometimes prescribed. Narcotic pain medicine is discouraged for long-term pain, since addiction is a possible outcome.   If you smoke, quit.   Eat healthy foods and maintain a recommended body weight.  SEEK IMMEDIATE MEDICAL CARE IF:   You have weakness or numbness in one of your legs or feet.   You have trouble controlling your bladder or bowels.   You develop nausea, vomiting, abdominal pain, shortness of breath, or fainting.  Document Released: 12/10/2004 Document Revised: 10/22/2011 Document Reviewed: 10/17/2011 [employer] Patient Information 2012 [employer].Chronic Pain Management Managing chronic pain is not easy. The goal is to provide as much pain relief as possible. There are emotional as well as physical problems. Chronic pain may lead to symptoms of depression which magnify those of the pain. Problems may include:  Anxiety.   Sleep disturbances.   Confused thinking.   Feeling cranky.   Fatigue.   Weight gain or loss.  Identify the source of the pain first, if possible. The pain may be masking another problem. Try to find a pain management specialist or clinic. Work with a team to create a treatment plan for you. MEDICATIONS  May include narcotics or opioids. Larger than normal doses may be needed to control your pain.   Drugs for depression may help.   Over-the-counter medicines may help for some conditions. These drugs may be used along with others for better pain relief.   May be injected into sites such as the spine   and joints. Injections may have to be repeated if they wear off.  THERAPY MAY INCLUDE:  Working with a physical therapist to keep from getting stiff.   Regular, gentle exercise.   Cognitive or behavioral therapy.   Using complementary or integrative medicine such as:   Acupuncture.   Massage, Reiki, or Rolfing.    Aroma, color, light, or sound therapy.   Group support.  FOR MORE INFORMATION http://www.painfoundation.org. American Chronic Pain Association http://www.thealpa.org. Document Released: 12/10/2004 Document Revised: 10/22/2011 Document Reviewed: 01/19/2008 [employer] Patient Information 2012 [employer]. 

## 2012-03-07 NOTE — ED Provider Notes (Signed)
Medical screening examination/treatment/procedure(s) were performed by non-physician practitioner and as supervising physician I was immediately available for consultation/collaboration.   Arel Tippen W. Peja Allender, MD 03/07/12 1030 

## 2012-11-17 ENCOUNTER — Emergency Department (HOSPITAL_COMMUNITY)
Admission: EM | Admit: 2012-11-17 | Discharge: 2012-11-17 | Disposition: A | Discharge status: Home / Self Care | Payer: Self-pay | Attending: Emergency Medicine | Admitting: Emergency Medicine

## 2012-11-17 ENCOUNTER — Encounter (HOSPITAL_COMMUNITY): Payer: Self-pay | Admitting: *Deleted

## 2012-11-17 DIAGNOSIS — M25511 Pain in right shoulder: Secondary | ICD-10-CM

## 2012-11-17 DIAGNOSIS — G8928 Other chronic postprocedural pain: Secondary | ICD-10-CM | POA: Insufficient documentation

## 2012-11-17 DIAGNOSIS — S4980XA Other specified injuries of shoulder and upper arm, unspecified arm, initial encounter: Secondary | ICD-10-CM | POA: Insufficient documentation

## 2012-11-17 DIAGNOSIS — F17200 Nicotine dependence, unspecified, uncomplicated: Secondary | ICD-10-CM | POA: Insufficient documentation

## 2012-11-17 DIAGNOSIS — Z79899 Other long term (current) drug therapy: Secondary | ICD-10-CM | POA: Insufficient documentation

## 2012-11-17 DIAGNOSIS — W278XXA Contact with other nonpowered hand tool, initial encounter: Secondary | ICD-10-CM | POA: Insufficient documentation

## 2012-11-17 DIAGNOSIS — Y929 Unspecified place or not applicable: Secondary | ICD-10-CM | POA: Insufficient documentation

## 2012-11-17 DIAGNOSIS — M25519 Pain in unspecified shoulder: Secondary | ICD-10-CM | POA: Insufficient documentation

## 2012-11-17 DIAGNOSIS — Y9389 Activity, other specified: Secondary | ICD-10-CM | POA: Insufficient documentation

## 2012-11-17 DIAGNOSIS — S46909A Unspecified injury of unspecified muscle, fascia and tendon at shoulder and upper arm level, unspecified arm, initial encounter: Secondary | ICD-10-CM | POA: Insufficient documentation

## 2012-11-17 DIAGNOSIS — R209 Unspecified disturbances of skin sensation: Secondary | ICD-10-CM | POA: Insufficient documentation

## 2012-11-17 MED ORDER — OXYCODONE-ACETAMINOPHEN 5-325 MG PO TABS
2.0000 | ORAL_TABLET | Freq: Once | ORAL | Status: AC
  Administered 2012-11-17: 2 via ORAL
  Filled 2012-11-17: qty 2

## 2012-11-17 MED ORDER — OXYCODONE-ACETAMINOPHEN 5-325 MG PO TABS: 1.0000 | ORAL_TABLET | ORAL | Status: AC | PRN

## 2012-11-17 MED ORDER — CYCLOBENZAPRINE HCL 10 MG PO TABS: 10.0000 mg | ORAL_TABLET | Freq: Three times a day (TID) | ORAL | Status: DC | PRN

## 2012-11-17 NOTE — ED Provider Notes (Signed)
History     CSN: [id number]  Arrival date & time 11/17/12  1234   First MD Initiated Contact with Patient 11/17/12 1336      Chief Complaint  Patient presents with  . Shoulder Pain    (Consider location/radiation/quality/duration/timing/severity/associated sxs/prior treatment) HPI Comments: Patient with hx of chronic right shoulder pain and shoulder surgery several years ago, states his shoulder began hurting worse after he was chopping wood earlier today and felt a sharp pain to his shoulder.  Pain is worse with movement of the right arm.  States the pain is sharp and burning in quality and radiates to his right lower back.  He denies neck pain, swelling, headache, swelling or distal weakness.  States his right finger tips are numb but have been since his shoulder surgery.  Patient states he would like to have a referral to pain management, states that he has seen orthopedics before and been told he needed another surgery, but does not want additional surgery   Patient is a 37 y.o. male presenting with shoulder injury. The history is provided by the patient.  Shoulder Injury This is a recurrent problem. The current episode started today. The problem occurs constantly. The problem has been unchanged. Associated symptoms include arthralgias. Pertinent negatives include no abdominal pain, chest pain, chills, congestion, coughing, diaphoresis, fever, headaches, joint swelling, neck pain, numbness, urinary symptoms, vomiting or weakness. The symptoms are aggravated by exertion (movement of the right arm). He has tried nothing for the symptoms. The treatment provided no relief.    Past Medical History  Diagnosis Date  . Back pain     Past Surgical History  Procedure Date  . Collarbone   . Orthopedic surgery     History reviewed. No pertinent family history.  History  Substance Use Topics  . Smoking status: Current Every Day Smoker -- 0.5 packs/day    Types: Cigarettes  . Smokeless  tobacco: Not on file  . Alcohol Use: No      Review of Systems  Constitutional: Negative for fever, chills and diaphoresis.  HENT: Negative for congestion, neck pain and neck stiffness.   Respiratory: Negative for cough.   Cardiovascular: Negative for chest pain.  Gastrointestinal: Negative for vomiting and abdominal pain.  Genitourinary: Negative for dysuria and difficulty urinating.  Musculoskeletal: Positive for arthralgias. Negative for joint swelling.  Skin: Negative for color change and wound.  Neurological: Negative for weakness, numbness and headaches.  All other systems reviewed and are negative.    Allergies  Penicillins and Bee venom  Home Medications   Current Outpatient Rx  Name  Route  Sig  Dispense  Refill  . CLINDAMYCIN HCL 300 MG PO CAPS      One capsule po QID x 10 days   40 capsule   0   . DOXYCYCLINE MONOHYDRATE 100 MG PO TABS   Oral   Take 100 mg by mouth 2 (two) times daily.         Marland Kitchen METHOCARBAMOL 500 MG PO TABS      Take two tabs po TID prn muscle spasms   42 tablet   0     BP 134/65  Pulse 80  Temp 97.9 F (36.6 C) (Oral)  Resp 18  Ht 6' (1.829 m)  Wt 258 lb (117.028 kg)  BMI 34.99 kg/m2  SpO2 99%  Physical Exam  Nursing note and vitals reviewed. Constitutional: He is oriented to person, place, and time. He appears well-developed and well-nourished. No distress.  HENT:  Head: Normocephalic and atraumatic.  Mouth/Throat: Oropharynx is clear and moist.  Neck: Normal range of motion. Neck supple. No thyromegaly present.  Cardiovascular: Normal rate, regular rhythm, normal heart sounds and intact distal pulses.   No murmur heard. Pulmonary/Chest: Effort normal and breath sounds normal. No respiratory distress. He exhibits no tenderness.  Musculoskeletal: He exhibits tenderness. He exhibits no edema.       Right shoulder: He exhibits decreased range of motion, tenderness and pain. He exhibits no bony tenderness, no swelling, no  effusion, no crepitus, no deformity, no laceration, normal pulse and normal strength.       Arms:      ttp of the right shoulder.  Pain with abduction of the right arm and rotation of the shoulder.  Radial pulse is brisk, sensation intact, CR< 2 sec.  No abrasions, edema or deformity of the joint. Well healed surgical scar at the right shoulder.  ttp of the right lumbar paraspinal muscles.  No edema or skin discoloration  Lymphadenopathy:    He has no cervical adenopathy.  Neurological: He is alert and oriented to person, place, and time. No cranial nerve deficit. He exhibits normal muscle tone. Coordination and gait normal.  Reflex Scores:      Tricep reflexes are 2+ on the right side and 2+ on the left side.      Bicep reflexes are 2+ on the right side and 2+ on the left side. Skin: Skin is warm and dry.    ED Course  Procedures (including critical care time)  Labs Reviewed - No data to display No results found.     MDM   Previous Ed charts reviewed.  Pt seen here previously for same.  Has recently moved back to this area.      Right shoulder pain that's chronic, excerbated today after "chopping wood".  No focal neuro deficits, pain reproduced with abduction of the arm. Likely musculoskeletal injury.   Pt requesting referral to pain management.  Given referall info for Dr. Laurian Brim and Dr. Gerilyn Pilgrim        Enijah Furr L. [place], [place] 11/17/12 2044

## 2012-11-17 NOTE — ED Notes (Signed)
Pain rt shoulder and back after "busting wood" 30 min pta.

## 2012-11-17 NOTE — ED Notes (Addendum)
Pt c/o right shoulder pain, states that the pain started after he was cutting wood, pain is worse with movement of right arm, pt also c/o lower back pain that radiates down right leg that started while he was sitting in triage waiting room,

## 2012-11-18 NOTE — ED Provider Notes (Signed)
Medical screening examination/treatment/procedure(s) were performed by non-physician practitioner and as supervising physician I was immediately available for consultation/collaboration.  Raeford Razor, MD 11/18/12 [phone]

## 2012-12-04 ENCOUNTER — Emergency Department (HOSPITAL_COMMUNITY)
Admission: EM | Admit: 2012-12-04 | Discharge: 2012-12-04 | Disposition: A | Discharge status: Home / Self Care | Payer: Self-pay | Attending: Emergency Medicine | Admitting: Emergency Medicine

## 2012-12-04 ENCOUNTER — Encounter (HOSPITAL_COMMUNITY): Payer: Self-pay | Admitting: *Deleted

## 2012-12-04 DIAGNOSIS — G8929 Other chronic pain: Secondary | ICD-10-CM | POA: Insufficient documentation

## 2012-12-04 DIAGNOSIS — M545 Low back pain, unspecified: Secondary | ICD-10-CM | POA: Insufficient documentation

## 2012-12-04 DIAGNOSIS — M549 Dorsalgia, unspecified: Secondary | ICD-10-CM

## 2012-12-04 DIAGNOSIS — F17200 Nicotine dependence, unspecified, uncomplicated: Secondary | ICD-10-CM | POA: Insufficient documentation

## 2012-12-04 MED ORDER — OXYCODONE-ACETAMINOPHEN 5-325 MG PO TABS
2.0000 | ORAL_TABLET | Freq: Once | ORAL | Status: AC
  Administered 2012-12-04: 2 via ORAL

## 2012-12-04 MED ORDER — CYCLOBENZAPRINE HCL 5 MG PO TABS: 5.0000 mg | ORAL_TABLET | Freq: Three times a day (TID) | ORAL | Status: DC | PRN

## 2012-12-04 MED ORDER — OXYCODONE-ACETAMINOPHEN 5-325 MG PO TABS: ORAL_TABLET | ORAL | Status: DC

## 2012-12-04 MED ORDER — CYCLOBENZAPRINE HCL 10 MG PO TABS: 5.0000 mg | ORAL_TABLET | Freq: Three times a day (TID) | ORAL | Status: DC | PRN

## 2012-12-04 MED ORDER — NAPROXEN 250 MG PO TABS: 250.0000 mg | ORAL_TABLET | Freq: Two times a day (BID) | ORAL | Status: DC

## 2012-12-04 MED ORDER — OXYCODONE-ACETAMINOPHEN 5-325 MG PO TABS
ORAL_TABLET | ORAL | Status: AC
  Administered 2012-12-04: 2 via ORAL
  Filled 2012-12-04: qty 2

## 2012-12-04 NOTE — ED Provider Notes (Signed)
History     CSN: [id number]  Arrival date & time 12/04/12  1652   First MD Initiated Contact with Patient 12/04/12 1758      Chief Complaint  Patient presents with  . Back Pain    HPI Pt was seen at 1810.  Per pt, c/o gradual onset and persistence of constant acute flair of his chronic low back "pain" for the past several months, worse over the past several weeks. States he has an appt in Feb with Dr. Gerilyn Pilgrim for pain management. Requesting flexeril "but at a lower dose because it makes me tired" and oxycodone for pain "but more than 15 of them."  Denies any change in his usual chronic pain pattern.  Pain worsens with palpation of the area and body position changes. Denies incont/retention of bowel or bladder, no saddle anesthesia, no focal motor weakness, no tingling/numbness in extremities, no fevers, no injury, no abd pain.  The symptoms have been associated with no other complaints. The patient has a significant history of similar symptoms previously, recently being evaluated for this complaint and multiple prior evals for same.     Past Medical History  Diagnosis Date  . Back pain     Past Surgical History  Procedure Date  . Collarbone   . Orthopedic surgery     History  Substance Use Topics  . Smoking status: Current Every Day Smoker -- 0.5 packs/day    Types: Cigarettes  . Smokeless tobacco: Not on file  . Alcohol Use: No      Review of Systems ROS: Statement: All systems negative except as marked or noted in the HPI; Constitutional: Negative for fever and chills. ; ; Eyes: Negative for eye pain, redness and discharge. ; ; ENMT: Negative for ear pain, hoarseness, nasal congestion, sinus pressure and sore throat. ; ; Cardiovascular: Negative for chest pain, palpitations, diaphoresis, dyspnea and peripheral edema. ; ; Respiratory: Negative for cough, wheezing and stridor. ; ; Gastrointestinal: Negative for nausea, vomiting, diarrhea, abdominal pain, blood in stool,  hematemesis, jaundice and rectal bleeding. . ; ; Genitourinary: Negative for dysuria, flank pain and hematuria. ; ; Musculoskeletal: +LBP. Negative for neck pain. Negative for swelling and trauma.; ; Skin: Negative for pruritus, rash, abrasions, blisters, bruising and skin lesion.; ; Neuro: Negative for headache, lightheadedness and neck stiffness. Negative for weakness, altered level of consciousness , altered mental status, extremity weakness, paresthesias, involuntary movement, seizure and syncope.       Allergies  Penicillins and Bee venom  Home Medications   Current Outpatient Rx  Name  Route  Sig  Dispense  Refill  . CYCLOBENZAPRINE HCL 10 MG PO TABS   Oral   Take 1 tablet (10 mg total) by mouth 3 (three) times daily as needed for muscle spasms.   21 tablet   0     BP 120/64  Pulse 93  Temp 97.6 F (36.4 C) (Oral)  Resp 20  Ht 6' (1.829 m)  Wt 248 lb (112.492 kg)  BMI 33.63 kg/m2  SpO2 99%  Physical Exam 1815: Physical examination:  Nursing notes reviewed; Vital signs and O2 SAT reviewed;  Constitutional: Well developed, Well nourished, Well hydrated, In no acute distress; Head:  Normocephalic, atraumatic; Eyes: EOMI, PERRL, No scleral icterus; ENMT: Mouth and pharynx normal, Mucous membranes moist; Neck: Supple, Full range of motion, No lymphadenopathy; Cardiovascular: Regular rate and rhythm, No murmur, rub, or gallop; Respiratory: Breath sounds clear & equal bilaterally, No rales, rhonchi, wheezes.  Speaking full sentences  with ease, Normal respiratory effort/excursion; Chest: Nontender, Movement normal; Abdomen: Soft, Nontender, Nondistended, Normal bowel sounds; Genitourinary: No CVA tenderness; Spine:  No midline CS, TS, LS tenderness.  +TTP right lumbar paraspinal muscles; Extremities: Pulses normal, No tenderness, No edema, No calf edema or asymmetry.; Neuro: AA&Ox3, Major CN grossly intact.  Speech clear. Strength 5/5 equal bilat UE's and LE's, including great toe  dorsiflexion.  DTR 2/4 equal bilat UE's and LE's.  No gross sensory deficits.  Neg straight leg raises bilat. Gait steady.;;; Skin: Color normal, Warm, Dry.   ED Course  Procedures   MDM  MDM Reviewed: previous chart, nursing note and vitals     1820:  Long hx of chronic pain with multiple ED visits for same.  Pt endorses acute flair of his usual long standing chronic pain today, no change from his usual chronic pain pattern.  Pt encouraged to f/u with his PMD and Pain Management doctor for good continuity of care and control of his chronic pain. Pt also aware that I will not be rx him oxycodone. Verb understanding.   1830:  Pt requested ED RN to ask me to "void" his naprosyn rx because he "still had a bunch at home" and only wants to fill the narcotic and flexeril rx.  EPIC chart reviewed:  Pt has not been rx naprosyn in our system since 11/2011 (one year ago).  Very concerned regarding this statement.  [place] Controlled Substance Database accessed: one narcotic rx by one provider in the last 3 months.   [place] Cont[place] Substance [place] accessed: pt with 6 narcotic rx (vicodin and percocet) written by 6 different providers through 3 different pharmacies under 2 different names and 3 different addresses over a 4 month period.  Very concerned regarding this information, as well as request for "more than 15 oxycodone" today.  Will not void rx, pt made aware.       Laray Anger, DO 12/06/12 1404

## 2012-12-04 NOTE — ED Notes (Signed)
Patient with no complaints at this time. Respirations even and unlabored. Skin warm/dry. Discharge instructions reviewed with patient at this time. Patient given opportunity to voice concerns/ask questions. Patient discharged at this time and left Emergency Department with steady gait.   

## 2012-12-04 NOTE — ED Notes (Signed)
Chronic back pain, worsening over last 2 wks.

## 2012-12-04 NOTE — ED Notes (Addendum)
Was seen here on 11/17/2012 for increase of chronic back pain.  Stated he was given Flexeril which made him sleep all the time.  Pharmacy tech stated patient told her the flexeril made him sick, hydrocodone didn't work for him.  Wants to get oxycodone - more than 15.

## 2012-12-18 ENCOUNTER — Encounter (HOSPITAL_COMMUNITY): Payer: Self-pay | Admitting: Emergency Medicine

## 2012-12-18 ENCOUNTER — Emergency Department (HOSPITAL_COMMUNITY)
Admission: EM | Admit: 2012-12-18 | Discharge: 2012-12-18 | Disposition: A | Discharge status: Home / Self Care | Payer: Self-pay | Attending: Emergency Medicine | Admitting: Emergency Medicine

## 2012-12-18 DIAGNOSIS — M25519 Pain in unspecified shoulder: Secondary | ICD-10-CM | POA: Insufficient documentation

## 2012-12-18 DIAGNOSIS — Z87828 Personal history of other (healed) physical injury and trauma: Secondary | ICD-10-CM | POA: Insufficient documentation

## 2012-12-18 DIAGNOSIS — Z79899 Other long term (current) drug therapy: Secondary | ICD-10-CM | POA: Insufficient documentation

## 2012-12-18 DIAGNOSIS — M545 Low back pain, unspecified: Secondary | ICD-10-CM | POA: Insufficient documentation

## 2012-12-18 DIAGNOSIS — Z7982 Long term (current) use of aspirin: Secondary | ICD-10-CM | POA: Insufficient documentation

## 2012-12-18 DIAGNOSIS — G8929 Other chronic pain: Secondary | ICD-10-CM

## 2012-12-18 DIAGNOSIS — F17200 Nicotine dependence, unspecified, uncomplicated: Secondary | ICD-10-CM | POA: Insufficient documentation

## 2012-12-18 DIAGNOSIS — Z8739 Personal history of other diseases of the musculoskeletal system and connective tissue: Secondary | ICD-10-CM | POA: Insufficient documentation

## 2012-12-18 MED ORDER — KETOROLAC TROMETHAMINE 60 MG/2ML IM SOLN
60.0000 mg | Freq: Once | INTRAMUSCULAR | Status: AC
Start: 2012-12-18 — End: 2012-12-18
  Administered 2012-12-18: 60 mg via INTRAMUSCULAR
  Filled 2012-12-18: qty 2

## 2012-12-18 MED ORDER — OXYCODONE-ACETAMINOPHEN 5-325 MG PO TABS
1.0000 | ORAL_TABLET | Freq: Once | ORAL | Status: AC
  Administered 2012-12-18: 1 via ORAL
  Filled 2012-12-18: qty 1

## 2012-12-18 MED ORDER — OXYCODONE-ACETAMINOPHEN 5-325 MG PO TABS: 1.0000 | ORAL_TABLET | ORAL | Status: DC | PRN

## 2012-12-18 NOTE — ED Notes (Signed)
Pt ambulated to BR and back to room without difficulty 
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
## 2012-12-18 NOTE — ED Provider Notes (Signed)
Medical screening examination/treatment/procedure(s) were performed by non-physician practitioner and as supervising physician I was immediately available for consultation/collaboration.   Lyanne Co, MD 12/18/12 [phone]

## 2013-01-01 ENCOUNTER — Emergency Department (HOSPITAL_COMMUNITY)
Admission: EM | Admit: 2013-01-01 | Discharge: 2013-01-01 | Disposition: A | Discharge status: Home / Self Care | Payer: Self-pay | Attending: Emergency Medicine | Admitting: Emergency Medicine

## 2013-01-01 ENCOUNTER — Encounter (HOSPITAL_COMMUNITY): Payer: Self-pay | Admitting: *Deleted

## 2013-01-01 DIAGNOSIS — F17200 Nicotine dependence, unspecified, uncomplicated: Secondary | ICD-10-CM | POA: Insufficient documentation

## 2013-01-01 DIAGNOSIS — Z8739 Personal history of other diseases of the musculoskeletal system and connective tissue: Secondary | ICD-10-CM | POA: Insufficient documentation

## 2013-01-01 DIAGNOSIS — G8929 Other chronic pain: Secondary | ICD-10-CM | POA: Insufficient documentation

## 2013-01-01 DIAGNOSIS — Z760 Encounter for issue of repeat prescription: Secondary | ICD-10-CM | POA: Insufficient documentation

## 2013-01-01 DIAGNOSIS — M25519 Pain in unspecified shoulder: Secondary | ICD-10-CM | POA: Insufficient documentation

## 2013-01-01 MED ORDER — NAPROXEN 500 MG PO TABS: 500.0000 mg | ORAL_TABLET | Freq: Two times a day (BID) | ORAL | Status: DC

## 2013-01-01 MED ORDER — OXYCODONE-ACETAMINOPHEN 5-325 MG PO TABS
1.0000 | ORAL_TABLET | Freq: Once | ORAL | Status: AC
  Administered 2013-01-01: 1 via ORAL
  Filled 2013-01-01: qty 1
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
## 2013-01-01 NOTE — ED Notes (Signed)
Waiting to get in clinic. Pain to right shoulder, chronic. Lower back pain also, pt states from arthritis.
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
## 2013-01-01 NOTE — ED Notes (Signed)
Pt stated that he would not get naproxen filled because "they don't work"

## 2013-01-01 NOTE — ED Provider Notes (Signed)
Medical screening examination/treatment/procedure(s) were performed by non-physician practitioner and as supervising physician I was immediately available for consultation/collaboration. Devoria Albe, MD, [facility]   Ward Givens, MD 01/01/13 [phone]

## 2013-01-04 ENCOUNTER — Emergency Department (HOSPITAL_COMMUNITY)
Admission: EM | Admit: 2013-01-04 | Discharge: 2013-01-04 | Disposition: A | Discharge status: Home / Self Care | Payer: Self-pay | Attending: Emergency Medicine | Admitting: Emergency Medicine

## 2013-01-04 ENCOUNTER — Encounter (HOSPITAL_COMMUNITY): Payer: Self-pay | Admitting: *Deleted

## 2013-01-04 DIAGNOSIS — M549 Dorsalgia, unspecified: Secondary | ICD-10-CM | POA: Insufficient documentation

## 2013-01-04 DIAGNOSIS — Z9889 Other specified postprocedural states: Secondary | ICD-10-CM | POA: Insufficient documentation

## 2013-01-04 DIAGNOSIS — M25519 Pain in unspecified shoulder: Secondary | ICD-10-CM | POA: Insufficient documentation

## 2013-01-04 DIAGNOSIS — Z8781 Personal history of (healed) traumatic fracture: Secondary | ICD-10-CM | POA: Insufficient documentation

## 2013-01-04 DIAGNOSIS — G8921 Chronic pain due to trauma: Secondary | ICD-10-CM | POA: Insufficient documentation

## 2013-01-04 DIAGNOSIS — F17200 Nicotine dependence, unspecified, uncomplicated: Secondary | ICD-10-CM | POA: Insufficient documentation

## 2013-01-04 MED ORDER — TRAMADOL HCL 50 MG PO TABS: 50.0000 mg | ORAL_TABLET | Freq: Four times a day (QID) | ORAL | Status: DC | PRN

## 2013-01-04 MED ORDER — TRAMADOL HCL 50 MG PO TABS
50.0000 mg | ORAL_TABLET | Freq: Once | ORAL | Status: AC
  Administered 2013-01-04: 50 mg via ORAL
  Filled 2013-01-04: qty 1

## 2013-01-04 NOTE — ED Notes (Signed)
Ongoing pain to right shoulder since last visit.

## 2013-01-04 NOTE — ED Notes (Signed)
Patient called out for pain medicine.

## 2013-01-04 NOTE — ED Provider Notes (Signed)
Medical screening examination/treatment/procedure(s) were performed by non-physician practitioner and as supervising physician I was immediately available for consultation/collaboration. Devoria Albe, MD, FACEP   Ward Givens, MD 01/04/13 [phone]
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
## 2013-01-04 NOTE — ED Notes (Signed)
Presents with chronic shoulder pain that is worsening over past week. Pt has an appoint at pain management clinic 01/27/13. NAD noted at this time.

## 2013-02-21 ENCOUNTER — Emergency Department (HOSPITAL_COMMUNITY)
Admission: EM | Admit: 2013-02-21 | Discharge: 2013-02-21 | Disposition: A | Discharge status: Home / Self Care | Payer: Self-pay | Attending: Emergency Medicine | Admitting: Emergency Medicine

## 2013-02-21 ENCOUNTER — Encounter (HOSPITAL_COMMUNITY): Payer: Self-pay | Admitting: *Deleted

## 2013-02-21 DIAGNOSIS — K029 Dental caries, unspecified: Secondary | ICD-10-CM | POA: Insufficient documentation

## 2013-02-21 DIAGNOSIS — K047 Periapical abscess without sinus: Secondary | ICD-10-CM | POA: Insufficient documentation

## 2013-02-21 DIAGNOSIS — Z8739 Personal history of other diseases of the musculoskeletal system and connective tissue: Secondary | ICD-10-CM | POA: Insufficient documentation

## 2013-02-21 DIAGNOSIS — Z87891 Personal history of nicotine dependence: Secondary | ICD-10-CM | POA: Insufficient documentation

## 2013-02-21 MED ORDER — CLINDAMYCIN HCL 150 MG PO CAPS
150.0000 mg | ORAL_CAPSULE | Freq: Once | ORAL | Status: AC
  Administered 2013-02-21: 150 mg via ORAL
  Filled 2013-02-21: qty 1

## 2013-02-21 MED ORDER — HYDROCODONE-ACETAMINOPHEN 5-325 MG PO TABS
1.0000 | ORAL_TABLET | Freq: Once | ORAL | Status: AC
  Administered 2013-02-21: 1 via ORAL
  Filled 2013-02-21: qty 1

## 2013-02-21 MED ORDER — HYDROCODONE-ACETAMINOPHEN 5-325 MG PO TABS: 1.0000 | ORAL_TABLET | ORAL | Status: DC | PRN

## 2013-02-21 MED ORDER — CLINDAMYCIN HCL 150 MG PO CAPS: 150.0000 mg | ORAL_CAPSULE | Freq: Four times a day (QID) | ORAL | Status: DC

## 2013-02-21 NOTE — ED Provider Notes (Signed)
History     CSN: [id number]  Arrival date & time 02/21/13  2026   First MD Initiated Contact with Patient 02/21/13 2058      Chief Complaint  Patient presents with  . Dental Pain    (Consider location/radiation/quality/duration/timing/severity/associated sxs/prior treatment) HPI Comments: Marvin Schroeder presents with a 1 day history of dental pain and gingival swelling.   The patient has a history of decay in the tooth involved which has recently started to cause increased pain,  Swelling and now drainage of pus this afternoon.  There has been no fevers,  Chills, nausea or vomiting, also no complaint of difficulty swallowing,  Although chewing makes pain worse.  The patient has tried tramadol and naproxen without relief of symptoms.      The history is provided by the patient.    Past Medical History  Diagnosis Date  . Back pain     Past Surgical History  Procedure Laterality Date  . Collarbone    . Orthopedic surgery      History reviewed. No pertinent family history.  History  Substance Use Topics  . Smoking status: Former Smoker -- 0.50 packs/day    Types: Cigarettes    Quit date: 01/21/2013  . Smokeless tobacco: Not on file  . Alcohol Use: No      Review of Systems  Constitutional: Negative for fever.  HENT: Positive for dental problem. Negative for sore throat, facial swelling, neck pain and neck stiffness.   Respiratory: Negative for shortness of breath.     Allergies  Penicillins and Bee venom  Home Medications   Current Outpatient Rx  Name  Route  Sig  Dispense  Refill  . naproxen (NAPROSYN) 500 MG tablet   Oral   Take 500 mg by mouth 2 (two) times daily as needed (for pain).         . traMADol (ULTRAM) 50 MG tablet   Oral   Take 1 tablet (50 mg total) by mouth every 6 (six) hours as needed for pain.   30 tablet   0   . acetaminophen (TYLENOL) 500 MG tablet   Oral   Take 1,000 mg by mouth every 6 (six) hours as needed for pain.         . clindamycin (CLEOCIN) 150 MG capsule   Oral   Take 1 capsule (150 mg total) by mouth 4 (four) times daily.   30 capsule   0   . HYDROcodone-acetaminophen (NORCO/VICODIN) 5-325 MG per tablet   Oral   Take 1 tablet by mouth every 4 (four) hours as needed for pain.   15 tablet   0     BP 158/69  Pulse 98  Temp(Src) 98.1 F (36.7 C) (Oral)  Resp 20  Ht 6' (1.829 m)  Wt 256 lb (116.121 kg)  BMI 34.71 kg/m2  SpO2 100%  Physical Exam  Constitutional: He is oriented to person, place, and time. He appears well-developed and well-nourished. No distress.  HENT:  Head: Normocephalic and atraumatic. No trismus in the jaw.  Right Ear: Tympanic membrane and external ear normal.  Left Ear: Tympanic membrane and external ear normal.  Mouth/Throat: Oropharynx is clear and moist and mucous membranes are normal. No oral lesions. Dental abscesses present.  Severe decay with deep cavity in the occlusive surface of his left lower third molar.  There is surrounding gingival edema with erythema and drainage of small amount of pus with palpation.  There is no induration of the buccal  mucosa, no fluctuant pus pockets appreciated.  Left sub-mandibular node is tender and swollen.  Eyes: Conjunctivae are normal.  Neck: Normal range of motion. Neck supple.  Cardiovascular: Normal rate and normal heart sounds.   Pulmonary/Chest: Effort normal.  Abdominal: He exhibits no distension.  Musculoskeletal: Normal range of motion.  Lymphadenopathy:    He has no cervical adenopathy.  Neurological: He is alert and oriented to person, place, and time.  Skin: Skin is warm and dry. No erythema.  Psychiatric: He has a normal mood and affect.    ED Course  Procedures (including critical care time)  Labs Reviewed - No data to display No results found.   1. Dental abscess       MDM  Patient with obvious dental infection and abscess with active draining, no fluctuant pocket or need for I&D today.  He  was prescribed clindamycin and hydrocodone.  Dental referrals were given.  The patient appears reasonably screened and/or stabilized for discharge and I doubt any other medical condition or other [facility] requiring further screening, evaluation, or treatment in the ED at this time prior to discharge.         Burgess Amor, PA-C 02/22/13 0001

## 2013-02-21 NOTE — ED Notes (Signed)
Pain rt mandibular molar, onset today 

## 2013-02-25 NOTE — ED Provider Notes (Signed)
Medical screening examination/treatment/procedure(s) were performed by non-physician practitioner and as supervising physician I was immediately available for consultation/collaboration.   Joya Gaskins, MD 02/25/13 [phone]

## 2013-05-17 ENCOUNTER — Emergency Department (HOSPITAL_COMMUNITY): Admission: EM | Admit: 2013-05-17 | Discharge: 2013-05-17 | Discharge status: Against Medical Advice | Payer: Self-pay

## 2013-05-18 ENCOUNTER — Emergency Department (HOSPITAL_COMMUNITY)
Admission: EM | Admit: 2013-05-18 | Discharge: 2013-05-18 | Disposition: A | Discharge status: Home / Self Care | Payer: Self-pay | Attending: Emergency Medicine | Admitting: Emergency Medicine

## 2013-05-18 ENCOUNTER — Encounter (HOSPITAL_COMMUNITY): Payer: Self-pay | Admitting: Emergency Medicine

## 2013-05-18 DIAGNOSIS — Y9389 Activity, other specified: Secondary | ICD-10-CM | POA: Insufficient documentation

## 2013-05-18 DIAGNOSIS — Y929 Unspecified place or not applicable: Secondary | ICD-10-CM | POA: Insufficient documentation

## 2013-05-18 DIAGNOSIS — W28XXXA Contact with powered lawn mower, initial encounter: Secondary | ICD-10-CM | POA: Insufficient documentation

## 2013-05-18 DIAGNOSIS — Z87891 Personal history of nicotine dependence: Secondary | ICD-10-CM | POA: Insufficient documentation

## 2013-05-18 DIAGNOSIS — S025XXA Fracture of tooth (traumatic), initial encounter for closed fracture: Secondary | ICD-10-CM | POA: Insufficient documentation

## 2013-05-18 MED ORDER — METRONIDAZOLE 500 MG PO TABS: 500.0000 mg | ORAL_TABLET | Freq: Two times a day (BID) | ORAL | Status: DC

## 2013-05-18 MED ORDER — HYDROCODONE-ACETAMINOPHEN 5-325 MG PO TABS: 1.0000 | ORAL_TABLET | ORAL | Status: DC | PRN

## 2013-05-18 NOTE — ED Provider Notes (Signed)
History    CSN: [id number] Arrival date & time 05/18/13  1144  First MD Initiated Contact with Marvin Schroeder 05/18/13 1155     Chief Complaint  Marvin Schroeder presents with  . Dental Injury   (Consider location/radiation/quality/duration/timing/severity/associated sxs/prior Treatment) Marvin Schroeder is a 37 y.o. male presenting with dental injury. The history is provided by the Marvin Schroeder.  Dental Injury This is a new problem. The current episode started yesterday. The problem occurs constantly. The problem has been gradually worsening. Pertinent negatives include no chest pain, chills, coughing, fever, headaches, nausea, neck pain or vomiting.   Marvin Schroeder is a 37 y.o. male who presents to the ED with dental pain after an injury. He states that yesterday he was working on a lawnmower and hit his mouth with a [employer] scientist. He cut his upper lip and broke his right upper tooth.  He complains of pain to the area today. He brought in the chip of tooth.  Marvin Schroeder states he is allergic to Penicillin and has had Cleocin in the past for his teeth and it is to expensive. Wants medication that is cheap.  Past Medical History  Diagnosis Date  . Back pain    Past Surgical History  Procedure Laterality Date  . Collarbone    . Orthopedic surgery     No family history on file. History  Substance Use Topics  . Smoking status: Former Smoker -- 0.50 packs/day    Types: Cigarettes    Quit date: 01/21/2013  . Smokeless tobacco: Not on file  . Alcohol Use: No    Review of Systems  Constitutional: Negative for fever and chills.  HENT: Positive for dental problem. Negative for neck pain. Facial swelling: pain.   Respiratory: Negative for cough.   Cardiovascular: Negative for chest pain.  Gastrointestinal: Negative for nausea and vomiting.  Skin: Positive for wound.  Neurological: Negative for headaches.  Psychiatric/Behavioral: The Marvin Schroeder is not nervous/anxious.     Allergies  Penicillins and Bee  venom  Home Medications   Current Outpatient Rx  Name  Route  Sig  Dispense  Refill  . acetaminophen (TYLENOL) 500 MG tablet   Oral   Take 1,000 mg by mouth every 6 (six) hours as needed for pain.         . clindamycin (CLEOCIN) 150 MG capsule   Oral   Take 1 capsule (150 mg total) by mouth 4 (four) times daily.   30 capsule   0   . HYDROcodone-acetaminophen (NORCO/VICODIN) 5-325 MG per tablet   Oral   Take 1 tablet by mouth every 4 (four) hours as needed for pain.   15 tablet   0   . naproxen (NAPROSYN) 500 MG tablet   Oral   Take 500 mg by mouth 2 (two) times daily as needed (for pain).         . traMADol (ULTRAM) 50 MG tablet   Oral   Take 1 tablet (50 mg total) by mouth every 6 (six) hours as needed for pain.   30 tablet   0    BP 138/74  Pulse 71  Temp(Src) 98.1 F (36.7 C) (Oral)  Resp 20  Ht 6' (1.829 m)  Wt 255 lb 3 oz (115.752 kg)  BMI 34.6 kg/m2  SpO2 100% Physical Exam  Nursing note and vitals reviewed. Constitutional: He is oriented to person, place, and time. He appears well-developed and well-nourished.  HENT:  Mouth/Throat: Oropharynx is clear and moist and mucous membranes are normal. Lacerations and  dental caries present.    There is a small laceration noted on the right upper lip. No bleeding no erythema or signs of infection. Broken tooth upper right noted.   Eyes: EOM are normal.  Neck: Neck supple.  Pulmonary/Chest: Effort normal.  Musculoskeletal: Normal range of motion.  Lymphadenopathy:    He has no cervical adenopathy.  Neurological: He is alert and oriented to person, place, and time. No cranial nerve deficit.  Skin: Skin is warm and dry.  Psychiatric: He has a normal mood and affect. His behavior is normal.    ED Course  Procedures   MDM  37 y.o. male with dental injury and pain. Tiny superficial laceration to the upper right lip. Will start antibiotics and pain management. Discussed with the Marvin Schroeder clinical findings  and plan of care and all questioned fully answered. He will follow up with the dental clinic.    Medication List    STOP taking these medications       acetaminophen 500 MG tablet  Commonly known as:  TYLENOL     clindamycin 150 MG capsule  Commonly known as:  CLEOCIN     traMADol 50 MG tablet  Commonly known as:  ULTRAM      TAKE these medications       HYDROcodone-acetaminophen 5-325 MG per tablet  Commonly known as:  NORCO/VICODIN  Take 1 tablet by mouth every 4 (four) hours as needed.     metroNIDAZOLE 500 MG tablet  Commonly known as:  FLAGYL  Take 1 tablet (500 mg total) by mouth 2 (two) times daily.      ASK your doctor about these medications       naproxen 500 MG tablet  Commonly known as:  NAPROSYN  Take 500 mg by mouth 2 (two) times daily as needed (for pain).         [place], [place] 05/18/13 [phone]

## 2013-05-18 NOTE — ED Notes (Signed)
States that he hit himself in the right lip with a lawnmower blade yesterday and knocked out part of his upper right tooth.

## 2013-05-19 NOTE — ED Provider Notes (Signed)
Medical screening examination/treatment/procedure(s) were performed by non-physician practitioner and as supervising physician I was immediately available for consultation/collaboration.   Charles B. Sheldon, MD 05/19/13 1633 

## 2013-07-31 ENCOUNTER — Encounter (HOSPITAL_COMMUNITY): Payer: Self-pay | Admitting: Emergency Medicine

## 2013-07-31 DIAGNOSIS — Y929 Unspecified place or not applicable: Secondary | ICD-10-CM | POA: Insufficient documentation

## 2013-07-31 DIAGNOSIS — IMO0002 Reserved for concepts with insufficient information to code with codable children: Secondary | ICD-10-CM | POA: Insufficient documentation

## 2013-07-31 DIAGNOSIS — F17200 Nicotine dependence, unspecified, uncomplicated: Secondary | ICD-10-CM | POA: Insufficient documentation

## 2013-07-31 DIAGNOSIS — Y939 Activity, unspecified: Secondary | ICD-10-CM | POA: Insufficient documentation

## 2013-07-31 DIAGNOSIS — S90569A Insect bite (nonvenomous), unspecified ankle, initial encounter: Secondary | ICD-10-CM | POA: Insufficient documentation

## 2013-07-31 NOTE — ED Notes (Signed)
No answer

## 2013-07-31 NOTE — ED Notes (Signed)
Pt c/o insect bites to the left lower leg and rt pinky toe.

## 2013-08-01 ENCOUNTER — Encounter (HOSPITAL_COMMUNITY): Payer: Self-pay | Admitting: Emergency Medicine

## 2013-08-01 ENCOUNTER — Emergency Department (HOSPITAL_COMMUNITY)
Admission: EM | Admit: 2013-08-01 | Discharge: 2013-08-01 | Discharge status: Against Medical Advice | Payer: PRIVATE HEALTH INSURANCE | Attending: Emergency Medicine | Admitting: Emergency Medicine

## 2013-08-01 DIAGNOSIS — Y939 Activity, unspecified: Secondary | ICD-10-CM | POA: Insufficient documentation

## 2013-08-01 DIAGNOSIS — S90569A Insect bite (nonvenomous), unspecified ankle, initial encounter: Secondary | ICD-10-CM | POA: Insufficient documentation

## 2013-08-01 DIAGNOSIS — F17200 Nicotine dependence, unspecified, uncomplicated: Secondary | ICD-10-CM | POA: Insufficient documentation

## 2013-08-01 DIAGNOSIS — Y929 Unspecified place or not applicable: Secondary | ICD-10-CM | POA: Insufficient documentation

## 2013-08-01 NOTE — ED Notes (Signed)
Pt c/o insect bite to left lower leg.

## 2013-08-02 ENCOUNTER — Encounter (HOSPITAL_COMMUNITY): Payer: Self-pay | Admitting: Emergency Medicine

## 2013-08-02 ENCOUNTER — Emergency Department (HOSPITAL_COMMUNITY)
Admission: EM | Admit: 2013-08-02 | Discharge: 2013-08-03 | Disposition: A | Discharge status: Home / Self Care | Payer: PRIVATE HEALTH INSURANCE | Attending: Emergency Medicine | Admitting: Emergency Medicine

## 2013-08-02 DIAGNOSIS — L0390 Cellulitis, unspecified: Secondary | ICD-10-CM

## 2013-08-02 DIAGNOSIS — Z880 Allergy status to penicillin: Secondary | ICD-10-CM | POA: Insufficient documentation

## 2013-08-02 DIAGNOSIS — Z792 Long term (current) use of antibiotics: Secondary | ICD-10-CM | POA: Insufficient documentation

## 2013-08-02 DIAGNOSIS — F17200 Nicotine dependence, unspecified, uncomplicated: Secondary | ICD-10-CM | POA: Insufficient documentation

## 2013-08-02 DIAGNOSIS — L02419 Cutaneous abscess of limb, unspecified: Secondary | ICD-10-CM | POA: Insufficient documentation

## 2013-08-02 NOTE — ED Notes (Signed)
States came here and left due to long wait- had to leave because he has a job at [employer] and he could not afford to miss work.  Got off tonight due to pain in leg.  Also c/o nausea, vomiting and sweating for last couple of days, cannot eat, thinks it is due to problem with his lower leg

## 2013-08-02 NOTE — ED Notes (Signed)
Insect bite ? Unknown insect to left ankle x 3 or more days ago.  Redness and drainage with swelling of left foot.

## 2013-08-03 ENCOUNTER — Emergency Department (HOSPITAL_COMMUNITY)
Admission: EM | Admit: 2013-08-03 | Discharge: 2013-08-03 | Disposition: A | Discharge status: Home / Self Care | Payer: PRIVATE HEALTH INSURANCE | Attending: Emergency Medicine | Admitting: Emergency Medicine

## 2013-08-03 ENCOUNTER — Encounter (HOSPITAL_COMMUNITY): Payer: Self-pay | Admitting: *Deleted

## 2013-08-03 DIAGNOSIS — L0390 Cellulitis, unspecified: Secondary | ICD-10-CM

## 2013-08-03 DIAGNOSIS — F17200 Nicotine dependence, unspecified, uncomplicated: Secondary | ICD-10-CM | POA: Insufficient documentation

## 2013-08-03 DIAGNOSIS — Z880 Allergy status to penicillin: Secondary | ICD-10-CM | POA: Insufficient documentation

## 2013-08-03 DIAGNOSIS — L02419 Cutaneous abscess of limb, unspecified: Secondary | ICD-10-CM | POA: Insufficient documentation

## 2013-08-03 LAB — CBC WITH DIFFERENTIAL/PLATELET
Lymphocytes Relative: 25 % (ref 12–46)
Lymphs Abs: 2.5 10*3/uL (ref 0.7–4.0)
Neutro Abs: 6.4 10*3/uL (ref 1.7–7.7)
Neutrophils Relative %: 62 % (ref 43–77)
Platelets: 250 10*3/uL (ref 150–400)
RBC: 5.04 MIL/uL (ref 4.22–5.81)
WBC: 10.2 10*3/uL (ref 4.0–10.5)

## 2013-08-03 LAB — BASIC METABOLIC PANEL
CO2: 28 mEq/L (ref 19–32)
GFR calc non Af Amer: 83 mL/min — ABNORMAL LOW (ref >90)
Glucose, Bld: 98 mg/dL (ref 70–99)
Potassium: 4.2 mEq/L (ref 3.5–5.1)
Sodium: 135 mEq/L (ref 135–145)

## 2013-08-03 MED ORDER — SULFAMETHOXAZOLE-TRIMETHOPRIM 800-160 MG PO TABS: 2.0000 | ORAL_TABLET | Freq: Two times a day (BID) | ORAL | Status: DC

## 2013-08-03 MED ORDER — SODIUM CHLORIDE 0.9 % IV SOLN
Freq: Once | INTRAVENOUS | Status: AC
  Administered 2013-08-03: 20 mL/h via INTRAVENOUS

## 2013-08-03 MED ORDER — OXYCODONE-ACETAMINOPHEN 5-325 MG PO TABS: 1.0000 | ORAL_TABLET | ORAL | Status: DC | PRN

## 2013-08-03 MED ORDER — BACITRACIN-NEOMYCIN-POLYMYXIN 400-5-5000 EX OINT
TOPICAL_OINTMENT | Freq: Once | CUTANEOUS | Status: AC
  Administered 2013-08-03: 1 via TOPICAL
  Filled 2013-08-03: qty 1

## 2013-08-03 MED ORDER — ONDANSETRON 4 MG PO TBDP
4.0000 mg | ORAL_TABLET | Freq: Once | ORAL | Status: AC
  Administered 2013-08-03: 4 mg via ORAL
  Filled 2013-08-03: qty 1

## 2013-08-03 MED ORDER — LEVOFLOXACIN 500 MG PO TABS: 500.0000 mg | ORAL_TABLET | Freq: Every day | ORAL | Status: DC

## 2013-08-03 MED ORDER — HYDROMORPHONE HCL PF 1 MG/ML IJ SOLN
INTRAMUSCULAR | Status: AC
  Administered 2013-08-03: 1 mg
  Filled 2013-08-03: qty 1

## 2013-08-03 MED ORDER — VANCOMYCIN HCL IN DEXTROSE 1-5 GM/200ML-% IV SOLN
1000.0000 mg | Freq: Once | INTRAVENOUS | Status: AC
  Administered 2013-08-03: 1000 mg via INTRAVENOUS
  Filled 2013-08-03: qty 200

## 2013-08-03 MED ORDER — MORPHINE SULFATE 4 MG/ML IJ SOLN
4.0000 mg | Freq: Once | INTRAMUSCULAR | Status: AC
  Administered 2013-08-03: 4 mg via INTRAVENOUS
  Filled 2013-08-03: qty 1

## 2013-08-03 MED ORDER — CLINDAMYCIN HCL 150 MG PO CAPS: 450.0000 mg | ORAL_CAPSULE | Freq: Three times a day (TID) | ORAL | Status: DC

## 2013-08-03 MED ORDER — OXYCODONE-ACETAMINOPHEN 5-325 MG PO TABS
1.0000 | ORAL_TABLET | Freq: Once | ORAL | Status: AC
  Administered 2013-08-03: 1 via ORAL
  Filled 2013-08-03: qty 1

## 2013-08-03 NOTE — ED Provider Notes (Signed)
CSN: [id number]     Arrival date & time 08/02/13  2319 History   First MD Initiated Contact with Patient 08/03/13 0003     Chief Complaint  Patient presents with  . Wound Infection   (Consider location/radiation/quality/duration/timing/severity/associated sxs/prior Treatment) HPI Comments: Marvin Schroeder is a 37 y.o. Male presenting with a wound to his left lower leg which has become increasingly pain, swollen and now with redness and pain spreading into his foot.  He suspects possibly being bit by an insect, stating he was working in a crawl space last week and sustained insect bites while there.  He squeezed a small amount of pus from the wound site but there has been no further drainage.  He has taken naproxen without relief of symptoms and also borrowed a percocet from his mother which did help some.  He denies fevers or chills, vomiting but has had mild nausea.  He denies anorexia and has no radiation of pain into his proximal leg.      The history is provided by the patient.    Past Medical History  Diagnosis Date  . Back pain    Past Surgical History  Procedure Laterality Date  . Collarbone    . Orthopedic surgery     No family history on file. History  Substance Use Topics  . Smoking status: Current Every Day Smoker -- 0.50 packs/day    Types: Cigarettes    Last Attempt to Quit: 01/21/2013  . Smokeless tobacco: Not on file  . Alcohol Use: No    Review of Systems  Constitutional: Negative for fever and chills.  HENT: Negative for congestion, sore throat, facial swelling and neck pain.   Eyes: Negative.   Respiratory: Negative for chest tightness, shortness of breath and wheezing.   Cardiovascular: Negative for chest pain.  Gastrointestinal: Negative for nausea and abdominal pain.  Genitourinary: Negative.   Musculoskeletal: Negative for joint swelling and arthralgias.  Skin: Positive for color change and wound. Negative for rash.  Neurological: Negative for  dizziness, weakness, light-headedness, numbness and headaches.  Psychiatric/Behavioral: Negative.     Allergies  Penicillins and Bee venom  Home Medications   Current Outpatient Rx  Name  Route  Sig  Dispense  Refill  . HYDROcodone-acetaminophen (NORCO/VICODIN) 5-325 MG per tablet   Oral   Take 1 tablet by mouth every 4 (four) hours as needed.   15 tablet   0   . levofloxacin (LEVAQUIN) 500 MG tablet   Oral   Take 1 tablet (500 mg total) by mouth daily.   10 tablet   0   . metroNIDAZOLE (FLAGYL) 500 MG tablet   Oral   Take 1 tablet (500 mg total) by mouth 2 (two) times daily.   14 tablet   0   . naproxen (NAPROSYN) 500 MG tablet   Oral   Take 500 mg by mouth 2 (two) times daily as needed (for pain).         Marland Kitchen oxyCODONE-acetaminophen (PERCOCET/ROXICET) 5-325 MG per tablet   Oral   Take 1 tablet by mouth every 4 (four) hours as needed for pain.   20 tablet   0   . sulfamethoxazole-trimethoprim (SEPTRA DS) 800-160 MG per tablet   Oral   Take 2 tablets by mouth 2 (two) times daily.   40 tablet   0    BP 121/76  Pulse 78  Temp(Src) 97.9 F (36.6 C) (Oral)  Resp 20  Ht 6' (1.829 m)  Wt 254  lb (115.214 kg)  BMI 34.44 kg/m2  SpO2 98% Physical Exam  Constitutional: He appears well-developed and well-nourished. No distress.  HENT:  Head: Normocephalic.  Neck: Neck supple.  Cardiovascular: Normal rate.   Pulmonary/Chest: Effort normal. He has no wheezes.  Musculoskeletal: Normal range of motion. He exhibits no edema.  Skin: There is erythema.  Erythema left lower leg anterior distal tibia radiating across dorsal foot, not involving toes.  There is a scabbed, ulcerated lesion distal tibia at superior edge of cellulitic patch.  Tender.  Pedal pulses intact,  Warm to touch,  No fluctuance,  approx quarter sized induration surrounding lesion.  No red streaking.      ED Course  Procedures (including critical care time)  Informal bedside US without  abscess.  Pt was given IV dose of vancomycin.  Also given morphine without pain relief.  Given dilaudid 1 mg IV.  Wound edges marked.   Labs Review Labs Reviewed  BASIC METABOLIC PANEL - Abnormal; Notable for the following:    GFR calc non Af Amer 83 (*)    All other components within normal limits  CBC WITH DIFFERENTIAL   Imaging Review No results found.  MDM   1. Cellulitis     Pt also seen by Dr Read Drivers during this visit.  Pt was given IV vanc 1 gram.  Prescribed bactrim,  Levaquin.  Encouraged elevation,  Heat tx, recheck in 24 hours here,  Sooner for significantly worsened sx.    Burgess Amor, PA-C 08/03/13 [phone]

## 2013-08-03 NOTE — ED Provider Notes (Signed)
Medical screening examination/treatment/procedure(s) were conducted as a shared visit with non-physician practitioner(s) and myself.  I personally evaluated the patient during the encounter  Her family of left ankle and foot consistent with cellulitis. The erythematous center may represent a draining abscess, no pus pocket was seen on bedside ultrasound.  Hanley Seamen, MD 08/03/13 [phone]

## 2013-08-03 NOTE — ED Provider Notes (Signed)
CSN: [id number]     Arrival date & time 08/03/13  1019 History  This chart was scribed for Candyce Churn, MD by Quintella Reichert, ED scribe.  This patient was seen in room [facility]06 and the patient's care was started at 11:31 AM.   Chief Complaint  Patient presents with  . Leg Pain    Patient is a 37 y.o. male presenting with leg pain. The history is provided by the patient. No language interpreter was used.  Leg Pain Location:  Leg Time since incident:  4 days Lower extremity injury: possible insect bite.   Leg location:  L lower leg Pain details:    Severity:  Moderate   Onset quality:  Gradual   Timing:  Constant   Progression:  Worsening Chronicity:  New Associated symptoms: swelling   Associated symptoms: no muscle weakness, no numbness and no tingling   Associated symptoms comment:  Redness, "hot flashes"   HPI Comments: Marvin Schroeder is a 37 y.o. male who presents to the Emergency Department complaining of a wound to his left lower leg with associated pain, swelling and redness.  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  Since then his symptoms have been gradually worsening and his redness and pain has recently begun to spread into his foot.  He was also able to produce some purulent discharge from the wound site.  Pt was seen in the [facility] yesterday for the same and diagnosed with cellulitis and given IV vancomycin and prescribed Bactrim and Levaquin.  He states that he could not afford to fill these prescriptions and his symptoms have continued to worsen since then.  Pt also notes he has been having "hot flashes" a few times daily since symptoms began.    Past Medical History  Diagnosis Date  . Back pain     Past Surgical History  Procedure Laterality Date  . Collarbone    . Orthopedic surgery      No family history on file.   History  Substance Use Topics  . Smoking status: Current Every Day Smoker --  0.50 packs/day    Types: Cigarettes    Last Attempt to Quit: 01/21/2013  . Smokeless tobacco: Not on file  . Alcohol Use: No     Review of Systems  Constitutional:       "hot flashes"  Skin:       Redness, swelling  All other systems reviewed and are negative.     Allergies  Penicillins and Bee venom  Home Medications   Current Outpatient Rx  Name  Route  Sig  Dispense  Refill  . acetaminophen (TYLENOL) 500 MG tablet   Oral   Take 1,000 mg by mouth every 6 (six) hours as needed for pain.         Marland Kitchen ibuprofen (ADVIL,MOTRIN) 200 MG tablet   Oral   Take 400 mg by mouth every 6 (six) hours as needed for pain.          BP 138/75  Pulse 80  Temp(Src) 98.5 F (36.9 C) (Oral)  SpO2 98%  Physical Exam  Nursing note and vitals reviewed. Constitutional: He is oriented to person, place, and time. He appears well-developed and well-nourished. No distress.  HENT:  Head: Normocephalic and atraumatic.  Eyes: Conjunctivae are normal. No scleral icterus.  Neck: Neck supple.  Cardiovascular: Normal rate and intact distal pulses.   Normal pulses.  Pulmonary/Chest: Effort normal. No stridor. No respiratory distress.  Abdominal: Normal appearance. He exhibits no distension.  Neurological: He is alert and oriented to person, place, and time.  Skin: Skin is warm and dry. No rash noted.  Redness and swelling to anterior left shin.   Ulceration to left anterior shin with no drainage, no palpable underlying masses or fluctuance.  Psychiatric: He has a normal mood and affect. His behavior is normal.    ED Course  Procedures (including critical care time)  DIAGNOSTIC STUDIES: Oxygen Saturation is 98% on room air, normal by my interpretation.    COORDINATION OF CARE: 11:39 AM-Discussed treatment plan which includes pain medication and antibiotics with pt at bedside and pt agreed to plan.    Labs Review Labs Reviewed - No data to display  Imaging Review No results  found.  MDM  No diagnosis found. Cellulitis to left leg. Has not been able to take antibiotics because he cannot afford them. Redness does not extend beyond borders drawn on prior visit. No fevers, nontoxic, well-appearing. Will change Bactrim and Levaquin to clindamycin.    I personally performed the services described in this documentation, which was scribed in my presence. The recorded information has been reviewed and is accurate.     Candyce Churn, MD 08/03/13 1256

## 2013-08-03 NOTE — ED Notes (Signed)
Pt dx with cellulitis yesterday. Pt states swelling has gotten worse. Pt had to receive IV antibiotics yesterday. States the Rx for yesterday was $115 and he could not afford them.

## 2013-08-06 ENCOUNTER — Encounter (HOSPITAL_COMMUNITY): Payer: Self-pay | Admitting: *Deleted

## 2013-08-06 ENCOUNTER — Emergency Department (HOSPITAL_COMMUNITY)
Admission: EM | Admit: 2013-08-06 | Discharge: 2013-08-06 | Disposition: A | Discharge status: Home / Self Care | Payer: PRIVATE HEALTH INSURANCE | Attending: Emergency Medicine | Admitting: Emergency Medicine

## 2013-08-06 DIAGNOSIS — Y929 Unspecified place or not applicable: Secondary | ICD-10-CM | POA: Insufficient documentation

## 2013-08-06 DIAGNOSIS — L02419 Cutaneous abscess of limb, unspecified: Secondary | ICD-10-CM | POA: Insufficient documentation

## 2013-08-06 DIAGNOSIS — Z880 Allergy status to penicillin: Secondary | ICD-10-CM | POA: Insufficient documentation

## 2013-08-06 DIAGNOSIS — Y9389 Activity, other specified: Secondary | ICD-10-CM | POA: Insufficient documentation

## 2013-08-06 DIAGNOSIS — Z792 Long term (current) use of antibiotics: Secondary | ICD-10-CM | POA: Insufficient documentation

## 2013-08-06 DIAGNOSIS — L089 Local infection of the skin and subcutaneous tissue, unspecified: Secondary | ICD-10-CM | POA: Insufficient documentation

## 2013-08-06 DIAGNOSIS — L03116 Cellulitis of left lower limb: Secondary | ICD-10-CM

## 2013-08-06 DIAGNOSIS — R1110 Vomiting, unspecified: Secondary | ICD-10-CM | POA: Insufficient documentation

## 2013-08-06 DIAGNOSIS — F17200 Nicotine dependence, unspecified, uncomplicated: Secondary | ICD-10-CM | POA: Insufficient documentation

## 2013-08-06 MED ORDER — OXYCODONE-ACETAMINOPHEN 5-325 MG PO TABS: 2.0000 | ORAL_TABLET | Freq: Four times a day (QID) | ORAL | Status: DC | PRN

## 2013-08-06 NOTE — ED Provider Notes (Signed)
CSN: [id number]     Arrival date & time 08/06/13  1503 History  This chart was scribed for Marvin Horn, MD by Caryn Bee, ED Scribe. This patient was seen in room [facility]03 and the patient's care was started 4:15 PM.    Chief Complaint  Patient presents with  . Insect Bite   The history is provided by the patient. No language interpreter was used.   HPI Comments: Marvin Schroeder is a 37 y.o. male who presents to the Emergency Department complaining of severe left lower leg pain due to localized cellulitis infection from an insect bite (or sting) that occurred a few days ago. Pt was prescribed Clindamycin in the ED a few days ago for the infection, but has had no relief. Left foot is swollen without discoloration. Since last ED visit, redness has improved and is now localized to the area of the bite or sting. Pt denies fever, confusion, or SOB. Pt has had 2 episodes of emesis in the past week. Pt denies true numbness or weakness to the left foot. Pt has taken pain medications with relief that were prescribed during his last ED visit, and has ran out.   Past Medical History  Diagnosis Date  . Back pain    Past Surgical History  Procedure Laterality Date  . Collarbone    . Orthopedic surgery     No family history on file. History  Substance Use Topics  . Smoking status: Current Every Day Smoker -- 0.50 packs/day    Types: Cigarettes    Last Attempt to Quit: 01/21/2013  . Smokeless tobacco: Not on file  . Alcohol Use: No    Review of Systems 10 Systems reviewed and are negative for acute change except as noted in the HPI.  Allergies  Penicillins and Bee venom  Home Medications   Current Outpatient Rx  Name  Route  Sig  Dispense  Refill  . clindamycin (CLEOCIN) 150 MG capsule   Oral   Take 3 capsules (450 mg total) by mouth 3 (three) times daily.   90 capsule   0   . oxyCODONE-acetaminophen (PERCOCET) 5-325 MG per tablet   Oral   Take 2 tablets by mouth every 6  (six) hours as needed for pain.   20 tablet   0    Triage Vitals: BP 136/79  Pulse 84  Temp(Src) 98.6 F (37 C) (Oral)  Resp 18  Ht 6' (1.829 m)  Wt 254 lb (115.214 kg)  BMI 34.44 kg/m2  SpO2 98%  Physical Exam  Nursing note and vitals reviewed. Constitutional:  Awake, alert, nontoxic appearance.  HENT:  Head: Atraumatic.  Eyes: Right eye exhibits no discharge. Left eye exhibits no discharge.  Neck: Neck supple.  Cardiovascular: Normal rate, regular rhythm and normal heart sounds.   No murmur heard. DP pulse intact. CR less than 2 seconds in all toes.   Pulmonary/Chest: Effort normal and breath sounds normal. He has no wheezes. He has no rales. He exhibits no tenderness.  Abdominal: Soft. There is no tenderness. There is no rebound.  Musculoskeletal: He exhibits no tenderness.  Baseline ROM, no obvious new focal weakness. Bilateral arms and right leg non tender. Right leg non tender. Left thigh and calf non tender. Left foot has normal light touch. Good passive ROM to toes on left foot. Moderate edema with mild tenderness dorsum of left foot without erythema. Left anterior ankle has 5cm diameter region of localized cellulitis with erythema, induration and tenderness.  Neurological:  Mental status and motor strength appears baseline for patient and situation.  Skin: No rash noted.  No obvious focal fluid collection with limited bedside ultrasound.   Psychiatric: He has a normal mood and affect.    ED Course  Procedures (including critical care time) DIAGNOSTIC STUDIES: Oxygen Saturation is 98% on room air, normal by my interpretation.    COORDINATION OF CARE: 4:22 PM- Pt advised of plan to continue taking prescribed Clindamycin, be discharged with pain medication, and return to the ED in a few days. Pt agrees.  Labs Review Labs Reviewed - No data to display Imaging Review No results found.  MDM   1. Cellulitis of left ankle    I doubt any other EMC precluding  discharge at this time including, but not necessarily limited to the following:abscess, sepsis, compartment syndrome. I personally performed the services described in this documentation, which was scribed in my presence. The recorded information has been reviewed and is accurate.   Marvin Horn, MD 08/07/13 2222

## 2013-08-06 NOTE — ED Notes (Signed)
Pt returns to er for swelling, redness to left ankle area, was seen in er 08/02/2013 for spider bite to same area, states that the area is not getting any better, now is starting to have n/v,

## 2013-08-07 ENCOUNTER — Encounter (HOSPITAL_COMMUNITY): Payer: Self-pay

## 2013-08-07 ENCOUNTER — Emergency Department (HOSPITAL_COMMUNITY)
Admission: EM | Admit: 2013-08-07 | Discharge: 2013-08-07 | Disposition: A | Discharge status: Home / Self Care | Payer: PRIVATE HEALTH INSURANCE | Attending: Emergency Medicine | Admitting: Emergency Medicine

## 2013-08-07 ENCOUNTER — Encounter (HOSPITAL_COMMUNITY): Payer: Self-pay | Admitting: *Deleted

## 2013-08-07 ENCOUNTER — Emergency Department (HOSPITAL_COMMUNITY)
Admission: EM | Admit: 2013-08-07 | Discharge: 2013-08-07 | Discharge status: Against Medical Advice | Payer: PRIVATE HEALTH INSURANCE | Attending: Emergency Medicine | Admitting: Emergency Medicine

## 2013-08-07 DIAGNOSIS — F17200 Nicotine dependence, unspecified, uncomplicated: Secondary | ICD-10-CM | POA: Insufficient documentation

## 2013-08-07 DIAGNOSIS — R220 Localized swelling, mass and lump, head: Secondary | ICD-10-CM | POA: Insufficient documentation

## 2013-08-07 DIAGNOSIS — L509 Urticaria, unspecified: Secondary | ICD-10-CM | POA: Insufficient documentation

## 2013-08-07 DIAGNOSIS — L02416 Cutaneous abscess of left lower limb: Secondary | ICD-10-CM

## 2013-08-07 DIAGNOSIS — L02419 Cutaneous abscess of limb, unspecified: Secondary | ICD-10-CM | POA: Insufficient documentation

## 2013-08-07 DIAGNOSIS — Z880 Allergy status to penicillin: Secondary | ICD-10-CM | POA: Insufficient documentation

## 2013-08-07 DIAGNOSIS — Z889 Allergy status to unspecified drugs, medicaments and biological substances status: Secondary | ICD-10-CM

## 2013-08-07 DIAGNOSIS — T368X5A Adverse effect of other systemic antibiotics, initial encounter: Secondary | ICD-10-CM | POA: Insufficient documentation

## 2013-08-07 DIAGNOSIS — Z792 Long term (current) use of antibiotics: Secondary | ICD-10-CM | POA: Insufficient documentation

## 2013-08-07 MED ORDER — LIDOCAINE-EPINEPHRINE-TETRACAINE (LET) SOLUTION
3.0000 mL | Freq: Once | NASAL | Status: AC
  Administered 2013-08-07: 3 mL via TOPICAL
  Filled 2013-08-07: qty 3

## 2013-08-07 MED ORDER — OXYCODONE-ACETAMINOPHEN 5-325 MG PO TABS
1.0000 | ORAL_TABLET | Freq: Once | ORAL | Status: AC
  Administered 2013-08-07: 1 via ORAL
  Filled 2013-08-07: qty 1

## 2013-08-07 MED ORDER — METHYLPREDNISOLONE SODIUM SUCC 125 MG IJ SOLR
125.0000 mg | Freq: Once | INTRAMUSCULAR | Status: AC
  Administered 2013-08-07: 125 mg via INTRAVENOUS
  Filled 2013-08-07: qty 2

## 2013-08-07 MED ORDER — LIDOCAINE-EPINEPHRINE (PF) 1 %-1:200000 IJ SOLN
10.0000 mL | Freq: Once | INTRAMUSCULAR | Status: AC
  Administered 2013-08-07: 10 mL via INTRADERMAL
  Filled 2013-08-07: qty 10

## 2013-08-07 MED ORDER — DIPHENHYDRAMINE HCL 25 MG PO CAPS
25.0000 mg | ORAL_CAPSULE | Freq: Once | ORAL | Status: AC
  Administered 2013-08-07: 25 mg via ORAL
  Filled 2013-08-07: qty 1

## 2013-08-07 MED ORDER — FAMOTIDINE 20 MG PO TABS
20.0000 mg | ORAL_TABLET | Freq: Once | ORAL | Status: AC
  Administered 2013-08-07: 20 mg via ORAL
  Filled 2013-08-07: qty 1

## 2013-08-07 NOTE — ED Notes (Signed)
Pt mother at bedside upon leaving and is driving pt home.

## 2013-08-07 NOTE — ED Notes (Signed)
Urticarial rash, feels throat is closing . Alert, Seen here last pm and had I and D of  Lt  Lower leg.   Taking clindamycin and percoct.

## 2013-08-07 NOTE — ED Notes (Signed)
Was here earlier, my left ankle is swelling and having pus and blood drain from it. I have taken the antibiotic and the percocet and they are not helping. They said to come back if it was not getting better and they would cut it open.

## 2013-08-07 NOTE — ED Provider Notes (Signed)
CSN: [id number]     Arrival date & time 08/07/13  0325 History   First MD Initiated Contact with Patient 08/07/13 0408     Chief Complaint  Patient presents with  . Abscess   Patient gave verbal permission to utilize photo for medical documentation only The image was not stored on any personal device  Patient is a 37 y.o. male presenting with abscess. The history is provided by the patient.  Abscess Location:  Leg Leg abscess location:  L ankle Abscess quality: draining, induration, painful, redness and warmth   Duration:  1 week Progression:  Worsening Pain details:    Timing:  Constant   Progression:  Worsening Chronicity:  Recurrent Relieved by:  Nothing Exacerbated by: palpation and movement. Associated symptoms: no fever and no vomiting   pt presents for repeat ER visits for cellulitis/abscess to his left ankle He has been to the ED multiple times, including on 9/21  He reports since that ED visit the swelling has worsened and now the wound is draining No known injury but reports he think he "got bit" while working but can not recall specific incident  Past Medical History  Diagnosis Date  . Back pain    Past Surgical History  Procedure Laterality Date  . Collarbone    . Orthopedic surgery     History reviewed. No pertinent family history. History  Substance Use Topics  . Smoking status: Current Every Day Smoker -- 0.50 packs/day    Types: Cigarettes    Last Attempt to Quit: 01/21/2013  . Smokeless tobacco: Not on file  . Alcohol Use: No    Review of Systems  Constitutional: Negative for fever.  Gastrointestinal: Negative for vomiting.  Skin: Positive for wound.    Allergies  Penicillins and Bee venom  Home Medications   Current Outpatient Rx  Name  Route  Sig  Dispense  Refill  . acetaminophen (TYLENOL) 500 MG tablet   Oral   Take 1,000 mg by mouth every 6 (six) hours as needed for pain.         . clindamycin (CLEOCIN) 150 MG capsule   Oral  Take 3 capsules (450 mg total) by mouth 3 (three) times daily.   90 capsule   0   . ibuprofen (ADVIL,MOTRIN) 200 MG tablet   Oral   Take 400 mg by mouth every 6 (six) hours as needed for pain.         Marland Kitchen oxyCODONE-acetaminophen (PERCOCET) 5-325 MG per tablet   Oral   Take 2 tablets by mouth every 6 (six) hours as needed for pain.   20 tablet   0    BP 132/90  Pulse 92  Temp(Src) 98.8 F (37.1 C) (Oral)  Resp 18  Ht 6' (1.829 m)  Wt 254 lb (115.214 kg)  BMI 34.44 kg/m2  SpO2 100% Physical Exam CONSTITUTIONAL: Well developed/well nourished, anxious HEAD: Normocephalic/atraumatic EYES: EOMI/PERRL ENMT: Mucous membranes moist NECK: supple no meningeal signs CV: S1/S2 noted, no murmurs/rubs/gallops noted LUNGS: Lungs are clear to auscultation bilaterally, no apparent distress ABDOMEN: soft, nontender, no rebound or guarding GU:no cva tenderness NEURO: Pt is awake/alert, moves all extremitiesx4 EXTREMITIES: pulses normal, full ROM SKIN: warm, color normal PSYCH:pt is very anxious     ED Course  Procedures  INCISION AND DRAINAGE Performed by: Joya Gaskins Consent: Verbal consent obtained. Risks and benefits: risks, benefits and alternatives were discussed Time out was called prior to starting procedure Type: abscess  Body area: left Lower extremity  Anesthesia: local infiltration  Incision was made with a scalpel.  Local anesthetic: lidocaine 1% with epinephrine  Anesthetic total: 5 ml  Complexity: complex Blunt dissection to break up loculations  Drainage: purulent  Drainage amount: moderate   Patient tolerance: Patient tolerated the procedure well with no immediate complications. Distal pulses intact and able to flex/extend left ankle and he is able to move all toes on left foot    Pt had just taken percocet prior to arrival to the ED He felt improved after abscess drainage Stable for d/c home.  If continues to improve no further wound  checks are necessary  MDM  No diagnosis found. Nursing notes including past medical history and social history reviewed and considered in documentation Previous records reviewed and considered - recent ED visit reviewed     Joya Gaskins, MD 08/07/13 [phone]

## 2013-08-07 NOTE — ED Provider Notes (Signed)
CSN: [id number]     Arrival date & time 08/07/13  1300 History  This chart was scribed for Flint Melter, MD by Bennett Scrape, ED Scribe. This patient was seen in room [facility]14/[facility]14 and the patient's care was started at 2:02 PM.   CC: Rash  The history is provided by the patient. No language interpreter was used.    HPI Comments: JUERGEN HARDENBROOK is a 37 y.o. male who presents to the Emergency Department complaining of a persistent rash on his back and bilateral thighs described as red and itching with associated mild throat swelling that he woke up with this morning. He has been seen in the ED twice in the past 2 days for an abscess with surrounding cellulitis on his left ankle last night. During the first visit he was prescribed percocet and clindamycin which he has been taking with no difficulties. He was seen again last night and had the area incised and drained. He denies any worsening of pain to the left ankle. He denies any trouble swallowing, SOB and CP as associated symptoms. He denies coming in contact with any known allergens.   Past Medical History  Diagnosis Date  . Back pain    Past Surgical History  Procedure Laterality Date  . Collarbone    . Orthopedic surgery     History reviewed. No pertinent family history. History  Substance Use Topics  . Smoking status: Current Every Day Smoker -- 0.50 packs/day    Types: Cigarettes    Last Attempt to Quit: 01/21/2013  . Smokeless tobacco: Not on file  . Alcohol Use: No    Review of Systems  HENT: Negative for trouble swallowing.   Respiratory: Negative for shortness of breath.   Cardiovascular: Negative for chest pain.  Skin: Positive for rash.  All other systems reviewed and are negative.    Allergies  Penicillins and Bee venom  Home Medications   Current Outpatient Rx  Name  Route  Sig  Dispense  Refill  . clindamycin (CLEOCIN) 150 MG capsule   Oral   Take 3 capsules (450 mg total) by mouth 3 (three) times  daily.   90 capsule   0   . oxyCODONE-acetaminophen (PERCOCET) 5-325 MG per tablet   Oral   Take 2 tablets by mouth every 6 (six) hours as needed for pain.   20 tablet   0    Triage Vitals: BP 113/65  Pulse 80  Temp(Src) 98.6 F (37 C) (Oral)  Resp 20  Ht 6' (1.829 m)  Wt 254 lb (115.214 kg)  BMI 34.44 kg/m2  SpO2 100%  Physical Exam  Nursing note and vitals reviewed. Constitutional: He is oriented to person, place, and time. He appears well-developed and well-nourished.  HENT:  Head: Normocephalic and atraumatic.  Right Ear: External ear normal.  Left Ear: External ear normal.  No oropharynx swelling, handling secretions  Eyes: Conjunctivae and EOM are normal. Pupils are equal, round, and reactive to light.  Neck: Normal range of motion and phonation normal. Neck supple.  Cardiovascular: Normal rate, regular rhythm, normal heart sounds and intact distal pulses.   Pulmonary/Chest: Effort normal and breath sounds normal. He exhibits no bony tenderness.  Abdominal: Soft. Normal appearance. There is no tenderness.  Musculoskeletal: Normal range of motion.  Left ankle has a bandage over a wound   Lymphadenopathy:    He has no cervical adenopathy.  Neurological: He is alert and oriented to person, place, and time. He has normal strength.  No cranial nerve deficit or sensory deficit. He exhibits normal muscle tone. Coordination normal.  Skin: Skin is warm, dry and intact.  scattered urticarial rash   Psychiatric: He has a normal mood and affect. His behavior is normal. Judgment and thought content normal.    ED Course  Procedures (including critical care time)  Medications  methylPREDNISolone sodium succinate (SOLU-MEDROL) 125 mg/2 mL injection 125 mg (125 mg Intravenous Given 08/07/13 1424)  diphenhydrAMINE (BENADRYL) capsule 25 mg (25 mg Oral Given 08/07/13 1424)  famotidine (PEPCID) tablet 20 mg (20 mg Oral Given 08/07/13 1424)  oxyCODONE-acetaminophen (PERCOCET/ROXICET)  5-325 MG per tablet 1 tablet (1 tablet Oral Given 08/07/13 1424)   Patient Vitals for the past 24 hrs:  BP Temp Temp src Pulse Resp SpO2 Height Weight  08/07/13 1522 115/72 mmHg - - 65 18 100 % - -  08/07/13 1310 113/65 mmHg 98.6 F (37 C) Oral 80 20 100 % 6' (1.829 m) 254 lb (115.214 kg)   16:17- he eloped from the ED. He was treated with a narcotic pain pill at 1406. At the time he eloped, he is not considered safe to drive. Apparently, his nurse, was told that his mother would be driving, when he left the emergency department. I was informed of these developments, after he left.  DIAGNOSTIC STUDIES: Oxygen Saturation is 100% on room air, normal by my interpretation.    COORDINATION OF CARE: 2:08 PM-Discussed treatment plan which includes medications with pt at bedside and pt agreed to plan.    MDM   1. Urticaria   2. Drug allergy      Urticaria, cause unclear, possibly related to antibiotic use, clindamycin. The patient was treated in ED, and left AGAINST MEDICAL ADVICE.    I personally performed the services described in this documentation, which was scribed in my presence. The recorded information has been reviewed and is accurate.     Flint Melter, MD 08/07/13 1623

## 2013-08-08 ENCOUNTER — Emergency Department (HOSPITAL_COMMUNITY)
Admission: EM | Admit: 2013-08-08 | Discharge: 2013-08-08 | Disposition: A | Discharge status: Home / Self Care | Payer: PRIVATE HEALTH INSURANCE | Attending: Emergency Medicine | Admitting: Emergency Medicine

## 2013-08-08 ENCOUNTER — Encounter (HOSPITAL_COMMUNITY): Payer: Self-pay

## 2013-08-08 DIAGNOSIS — Z880 Allergy status to penicillin: Secondary | ICD-10-CM | POA: Insufficient documentation

## 2013-08-08 DIAGNOSIS — Z792 Long term (current) use of antibiotics: Secondary | ICD-10-CM | POA: Insufficient documentation

## 2013-08-08 DIAGNOSIS — L0291 Cutaneous abscess, unspecified: Secondary | ICD-10-CM

## 2013-08-08 DIAGNOSIS — F17200 Nicotine dependence, unspecified, uncomplicated: Secondary | ICD-10-CM | POA: Insufficient documentation

## 2013-08-08 DIAGNOSIS — L02419 Cutaneous abscess of limb, unspecified: Secondary | ICD-10-CM | POA: Insufficient documentation

## 2013-08-08 MED ORDER — OXYCODONE-ACETAMINOPHEN 5-325 MG PO TABS
2.0000 | ORAL_TABLET | Freq: Once | ORAL | Status: AC
  Administered 2013-08-08: 2 via ORAL
  Filled 2013-08-08: qty 2

## 2013-08-08 MED ORDER — OXYCODONE-ACETAMINOPHEN 5-325 MG PO TABS
ORAL_TABLET | ORAL | Status: AC
  Filled 2013-08-08: qty 1

## 2013-08-08 MED ORDER — LIDOCAINE HCL (PF) 1 % IJ SOLN
5.0000 mL | Freq: Once | INTRAMUSCULAR | Status: DC
  Filled 2013-08-08: qty 5

## 2013-08-08 MED ORDER — DICLOFENAC SODIUM 75 MG PO TBEC: 75.0000 mg | DELAYED_RELEASE_TABLET | Freq: Two times a day (BID) | ORAL | Status: DC

## 2013-08-08 NOTE — ED Notes (Signed)
Pt refused I&D. Lidocaine returned to Pyxis.

## 2013-08-08 NOTE — ED Provider Notes (Signed)
CSN: [id number]     Arrival date & time 08/08/13  1953 History   First MD Initiated Contact with Patient 08/08/13 2010     Chief Complaint  Patient presents with  . Wound Check   (Consider location/radiation/quality/duration/timing/severity/associated sxs/prior Treatment) HPI Comments: Marvin Schroeder is a 37 y.o. male who presents to the Emergency Department complaining of continued pain, redness swelling secondary to cellulitis/abscess to his left ankle .  Patient has been seen here multiple times recently for same and currently taking clindamycin three times a day. Was given Percocet which he states he has ran out.   States the symptoms are improving, but continues to have swelling, redness and drainage.  Had I&D performed on 08/07/13 with significant improvement, but states he developed a rash and itching shortly after and believes he may have been allergic to the anesthesia that was used.  He returns on this visit requesting additional pain medication.  He denies vomiting, red streaks, chills, fever or calf pain   The history is provided by the patient.    Past Medical History  Diagnosis Date  . Back pain    Past Surgical History  Procedure Laterality Date  . Collarbone    . Orthopedic surgery     No family history on file. History  Substance Use Topics  . Smoking status: Current Every Day Smoker -- 0.50 packs/day    Types: Cigarettes    Last Attempt to Quit: 01/21/2013  . Smokeless tobacco: Not on file  . Alcohol Use: No    Review of Systems  Constitutional: Negative for fever and chills.  Gastrointestinal: Negative for nausea and vomiting.  Musculoskeletal: Negative for joint swelling and arthralgias.  Skin: Positive for color change and wound.       Abscess   Neurological: Negative for weakness and numbness.  Hematological: Negative for adenopathy.  All other systems reviewed and are negative.    Allergies  Penicillins and Bee venom  Home Medications    Current Outpatient Rx  Name  Route  Sig  Dispense  Refill  . clindamycin (CLEOCIN) 150 MG capsule   Oral   Take 3 capsules (450 mg total) by mouth 3 (three) times daily.   90 capsule   0   . oxyCODONE-acetaminophen (PERCOCET) 5-325 MG per tablet   Oral   Take 2 tablets by mouth every 6 (six) hours as needed for pain.   20 tablet   0    BP 138/62  Pulse 80  Temp(Src) 98 F (36.7 C) (Oral)  Resp 20  Ht 6' (1.829 m)  Wt 254 lb (115.214 kg)  BMI 34.44 kg/m2  SpO2 98% Physical Exam  Nursing note and vitals reviewed. Constitutional: He is oriented to person, place, and time. He appears well-developed and well-nourished. No distress.  HENT:  Head: Normocephalic and atraumatic.  Cardiovascular: Normal rate, regular rhythm and normal heart sounds.   No murmur heard. Pulmonary/Chest: Effort normal and breath sounds normal. No respiratory distress.  Musculoskeletal: Normal range of motion.  Neurological: He is alert and oriented to person, place, and time. He exhibits normal muscle tone. Coordination normal.  Skin: Skin is warm and dry. There is erythema.  Abscess to the lateral left ankle with previous incision and drainage performed.  Moderate purulent drainage remains as well as surrounding edema and erythema that extends distally to the proximal toes.  DP pulse is brisk, sensation intact, cap refill < 2 sec.  No calf pain or edema.  Patient has full  ROM of the ankle and toes.      ED Course  Procedures (including critical care time) Labs Review Labs Reviewed - No data to display Imaging Review No results found.  MDM   Previous ED charts reviewed by me.    2015  Patient also seen by Dr. Juleen China and care plan discussed with the patient.  Patient continues to have erythema and distal edema of the left ankle and foot which he states is improving from previous visits.   Advised patient that area needs to be reopened and irrigated but patient refusing to have the wound  re-opened at this time because he is stating that he has to be able to go to work tomorrow and he is afraid that he will be unable to walk if the area is incised again.  He agrees to elevate, warm water soaks and will return tomorrow evening for I&D if no improvement.  He is requesting additional pain medication, I will prescribe diclofenac, but understands that additional narcotics will not be prescribed.  Patient is ambulatory, VSS.  He appears stable for discharge at this time.    Marvin Moree L. Trisha Mangle, PA-C 08/09/13 1343

## 2013-08-08 NOTE — ED Notes (Signed)
Left lower leg still having pain and some draining per pt. The swelling is still the same per pt.

## 2013-08-15 NOTE — ED Provider Notes (Signed)
Medical screening examination/treatment/procedure(s) were conducted as a shared visit with non-physician practitioner(s) and myself.  I personally evaluated the patient during the encounter.  37yM with cellulitis to L foot/ ankle. Recommending trying to re-incise and drain area as suspect may remain with a collection. Refusing. Continued wound care and abx at this point. Return precautions.   Raeford Razor, MD 08/15/13 1719

## 2013-08-16 ENCOUNTER — Emergency Department (HOSPITAL_COMMUNITY)
Admission: EM | Admit: 2013-08-16 | Discharge: 2013-08-16 | Disposition: A | Discharge status: Home / Self Care | Payer: PRIVATE HEALTH INSURANCE | Attending: Emergency Medicine | Admitting: Emergency Medicine

## 2013-08-16 ENCOUNTER — Encounter (HOSPITAL_COMMUNITY): Payer: Self-pay

## 2013-08-16 DIAGNOSIS — Z792 Long term (current) use of antibiotics: Secondary | ICD-10-CM | POA: Insufficient documentation

## 2013-08-16 DIAGNOSIS — L02416 Cutaneous abscess of left lower limb: Secondary | ICD-10-CM

## 2013-08-16 DIAGNOSIS — Z880 Allergy status to penicillin: Secondary | ICD-10-CM | POA: Insufficient documentation

## 2013-08-16 DIAGNOSIS — F17200 Nicotine dependence, unspecified, uncomplicated: Secondary | ICD-10-CM | POA: Insufficient documentation

## 2013-08-16 DIAGNOSIS — L02419 Cutaneous abscess of limb, unspecified: Secondary | ICD-10-CM | POA: Insufficient documentation

## 2013-08-16 MED ORDER — HYDROCODONE-ACETAMINOPHEN 5-325 MG PO TABS: 1.0000 | ORAL_TABLET | ORAL | Status: DC | PRN

## 2013-08-16 NOTE — ED Notes (Signed)
Pt alert & oriented x4, stable gait. Patient given discharge instructions, paperwork & prescription(s). Patient  instructed to stop at the registration desk to finish any additional paperwork. Patient verbalized understanding. Pt left department w/ no further questions. 

## 2013-08-16 NOTE — ED Notes (Signed)
Pt reports spider bite to left foot 2-3 weeks ago. Wants foot rechecked. Is on antibiotics, is out of pain meds. ?fever at times.  Pt vomited x2 last night at work.

## 2013-08-18 NOTE — ED Provider Notes (Signed)
CSN: [id number]     Arrival date & time 08/16/13  1719 History   First MD Initiated Contact with Patient 08/16/13 1933     Chief Complaint  Patient presents with  . Wound Check   (Consider location/radiation/quality/duration/timing/severity/associated sxs/prior Treatment) HPI Comments: Marvin Schroeder is a 37 y.o. Male presenting for re-evaluation of the infected insect bite at his left ankle for which he has had multiple previous visits for,  The last occuring 1 week ago during which time he was advised having a repeat I & D due to suspected remaining abscess.  Patient refused this repeat procedure,  Desiring continued antibiotics and warm soaks as he was concerned about being able to work if it was reopened,  And he cannot miss any more work days or he will be fired.  He is currently still taking clindamycin,  Having 3 more days left.  He worked a full shift last night,  Which involves standing and has developed worsened localized pain and swelling at the infection site,  Stating the area was "flat" when he went to work and had been for the several days prior when he was keeping it elevated and applying warm compresses.  He denies radiation of pain which is constant and throbbing.  The redness extending to his foot has resolved and there has been no spontaneous bleeding or drainage from the wound site.  He denies fevers, chills, nausea.     The history is provided by the patient.    Past Medical History  Diagnosis Date  . Back pain    Past Surgical History  Procedure Laterality Date  . Collarbone    . Orthopedic surgery     No family history on file. History  Substance Use Topics  . Smoking status: Current Every Day Smoker -- 0.50 packs/day    Types: Cigarettes    Last Attempt to Quit: 01/21/2013  . Smokeless tobacco: Not on file  . Alcohol Use: No    Review of Systems  Constitutional: Negative for fever and chills.  HENT: Negative for facial swelling.   Respiratory:  Negative for shortness of breath and wheezing.   Skin: Positive for wound.  Neurological: Negative for numbness.    Allergies  Penicillins; Bee venom; and Xylocaine dental  Home Medications   Current Outpatient Rx  Name  Route  Sig  Dispense  Refill  . clindamycin (CLEOCIN) 150 MG capsule   Oral   Take 3 capsules (450 mg total) by mouth 3 (three) times daily.   90 capsule   0   . HYDROcodone-acetaminophen (NORCO/VICODIN) 5-325 MG per tablet   Oral   Take 1 tablet by mouth every 4 (four) hours as needed for pain.   15 tablet   0   . oxyCODONE-acetaminophen (PERCOCET) 5-325 MG per tablet   Oral   Take 2 tablets by mouth every 6 (six) hours as needed for pain.   20 tablet   0    BP 121/79  Pulse 96  Temp(Src) 98.6 F (37 C) (Oral)  Resp 20  Ht 6' (1.829 m)  Wt 252 lb (114.306 kg)  BMI 34.17 kg/m2  SpO2 98% Physical Exam  Constitutional: He appears well-developed and well-nourished. No distress.  HENT:  Head: Normocephalic.  Neck: Neck supple.  Cardiovascular: Normal rate.   Pulmonary/Chest: Effort normal. He has no wheezes.  Musculoskeletal: Normal range of motion. He exhibits no edema.  Skin:  2 cm raised, tender fluctuant lesion left lateral distal leg.  There  is no red streaking,  No surrounding erythema.  TTP.  No drainage.    ED Course  Procedures (including critical care time) Labs Review Labs Reviewed - No data to display Imaging Review No results found.  MDM   1. Abscess of left leg    Informal bedside US performed of site with small pus pocket identified.  Pt was advised he needs I and D.  He states he has to be at work in 1 hour and cannot stay for this procedure.  Advised that this will not get better until it is reopened,  Which he understands.  He is still taking his clindamycin.  Will continue warm soaks,  But states he will return in the am for I & D.  He does not have a pcp.  He was prescribed small quantity of hydrocodone.     Burgess Amor, PA-C 08/18/13 1352

## 2013-08-22 ENCOUNTER — Encounter (HOSPITAL_COMMUNITY): Payer: Self-pay | Admitting: Emergency Medicine

## 2013-08-22 ENCOUNTER — Emergency Department (HOSPITAL_COMMUNITY)
Admission: EM | Admit: 2013-08-22 | Discharge: 2013-08-22 | Disposition: A | Discharge status: Home / Self Care | Payer: PRIVATE HEALTH INSURANCE | Attending: Emergency Medicine | Admitting: Emergency Medicine

## 2013-08-22 DIAGNOSIS — F17200 Nicotine dependence, unspecified, uncomplicated: Secondary | ICD-10-CM | POA: Insufficient documentation

## 2013-08-22 DIAGNOSIS — Z880 Allergy status to penicillin: Secondary | ICD-10-CM | POA: Insufficient documentation

## 2013-08-22 DIAGNOSIS — K0889 Other specified disorders of teeth and supporting structures: Secondary | ICD-10-CM

## 2013-08-22 DIAGNOSIS — Z8739 Personal history of other diseases of the musculoskeletal system and connective tissue: Secondary | ICD-10-CM | POA: Insufficient documentation

## 2013-08-22 DIAGNOSIS — Z792 Long term (current) use of antibiotics: Secondary | ICD-10-CM | POA: Insufficient documentation

## 2013-08-22 DIAGNOSIS — K089 Disorder of teeth and supporting structures, unspecified: Secondary | ICD-10-CM | POA: Insufficient documentation

## 2013-08-22 MED ORDER — IBUPROFEN 800 MG PO TABS
800.0000 mg | ORAL_TABLET | Freq: Once | ORAL | Status: AC
  Administered 2013-08-22: 800 mg via ORAL
  Filled 2013-08-22: qty 1

## 2013-08-22 NOTE — ED Notes (Signed)
Pt states he was eating last night and a previously chipped tooth came out. Pt c/o facial pain and swelling.

## 2013-08-22 NOTE — ED Provider Notes (Signed)
CSN: [id number]     Arrival date & time 08/22/13  0305 History   First MD Initiated Contact with Patient 08/22/13 [phone]     Chief Complaint  Patient presents with  . Dental Pain   Patient is a 37 y.o. male presenting with tooth pain. The history is provided by the patient.  Dental Pain Location:  Upper Severity:  Moderate Onset quality:  Sudden Duration: earlier tonight. Timing:  Constant Progression:  Worsening Associated symptoms: no fever     Past Medical History  Diagnosis Date  . Back pain    Past Surgical History  Procedure Laterality Date  . Collarbone    . Orthopedic surgery     No family history on file. History  Substance Use Topics  . Smoking status: Current Every Day Smoker -- 0.50 packs/day    Types: Cigarettes    Last Attempt to Quit: 01/21/2013  . Smokeless tobacco: Not on file  . Alcohol Use: No    Review of Systems  Constitutional: Negative for fever.  Gastrointestinal: Negative for vomiting.    Allergies  Penicillins; Bee venom; and Xylocaine dental  Home Medications   Current Outpatient Rx  Name  Route  Sig  Dispense  Refill  . clindamycin (CLEOCIN) 150 MG capsule   Oral   Take 3 capsules (450 mg total) by mouth 3 (three) times daily.   90 capsule   0   . HYDROcodone-acetaminophen (NORCO/VICODIN) 5-325 MG per tablet   Oral   Take 1 tablet by mouth every 4 (four) hours as needed for pain.   15 tablet   0   . oxyCODONE-acetaminophen (PERCOCET) 5-325 MG per tablet   Oral   Take 2 tablets by mouth every 6 (six) hours as needed for pain.   20 tablet   0    BP 127/89  Pulse 62  Temp(Src) 97.7 F (36.5 C) (Oral)  Resp 17  Ht 6' (1.829 m)  Wt 250 lb (113.399 kg)  BMI 33.9 kg/m2  SpO2 96% Physical Exam CONSTITUTIONAL: Well developed/well nourished HEAD AND FACE: Normocephalic/atraumatic EYES: EOMI/PERRL ENMT: Mucous membranes moist.  Poor dentition.  No trismus.  No focal abscess noted. Evidence of decayed tooth noted.   NECK:  supple no meningeal signs CV: S1/S2 noted, no murmurs/rubs/gallops noted LUNGS: Lungs are clear to auscultation bilaterally, no apparent distress ABDOMEN: soft, nontender, no rebound or guarding NEURO: Pt is awake/alert, moves all extremitiesx4 EXTREMITIES:full ROM SKIN: warm, color normal  ED Course  Procedures  MDM   Pt reports while eating last night his previously chipped tooth came out.  There are no signs of fluctuant abscess.  He just finished a round of clindamycin yesterday so will defer further antibiotics for now.  He has been seen in ED recently for multiple issues and has been given several rounds of narcotic pain meds.  I asked him to call his dentist this morning  1. Pain, dental    Nursing notes including past medical history and social history reviewed and considered in documentation Previous records reviewed and considered Narcotic database reviewed    Joya Gaskins, MD 08/22/13 [phone]

## 2013-08-22 NOTE — ED Provider Notes (Signed)
Medical screening examination/treatment/procedure(s) were performed by non-physician practitioner and as supervising physician I was immediately available for consultation/collaboration. Sue Mcalexander, MD, FACEP   Zyion Doxtater L Nailani Full, MD 08/22/13 1103 

## 2013-10-06 ENCOUNTER — Encounter (HOSPITAL_COMMUNITY): Payer: Self-pay | Admitting: Emergency Medicine

## 2013-10-06 ENCOUNTER — Emergency Department (HOSPITAL_COMMUNITY): Payer: PRIVATE HEALTH INSURANCE

## 2013-10-06 ENCOUNTER — Emergency Department (HOSPITAL_COMMUNITY)
Admission: EM | Admit: 2013-10-06 | Discharge: 2013-10-06 | Disposition: A | Discharge status: Home / Self Care | Payer: PRIVATE HEALTH INSURANCE | Attending: Emergency Medicine | Admitting: Emergency Medicine

## 2013-10-06 DIAGNOSIS — M5416 Radiculopathy, lumbar region: Secondary | ICD-10-CM

## 2013-10-06 DIAGNOSIS — Y9389 Activity, other specified: Secondary | ICD-10-CM | POA: Insufficient documentation

## 2013-10-06 DIAGNOSIS — X500XXA Overexertion from strenuous movement or load, initial encounter: Secondary | ICD-10-CM | POA: Insufficient documentation

## 2013-10-06 DIAGNOSIS — IMO0002 Reserved for concepts with insufficient information to code with codable children: Secondary | ICD-10-CM | POA: Insufficient documentation

## 2013-10-06 DIAGNOSIS — Z880 Allergy status to penicillin: Secondary | ICD-10-CM | POA: Insufficient documentation

## 2013-10-06 DIAGNOSIS — Z87891 Personal history of nicotine dependence: Secondary | ICD-10-CM | POA: Insufficient documentation

## 2013-10-06 DIAGNOSIS — Y9239 Other specified sports and athletic area as the place of occurrence of the external cause: Secondary | ICD-10-CM | POA: Insufficient documentation

## 2013-10-06 MED ORDER — CYCLOBENZAPRINE HCL 10 MG PO TABS
10.0000 mg | ORAL_TABLET | Freq: Once | ORAL | Status: AC
  Administered 2013-10-06: 10 mg via ORAL
  Filled 2013-10-06: qty 1

## 2013-10-06 MED ORDER — IBUPROFEN 600 MG PO TABS: 600.0000 mg | ORAL_TABLET | Freq: Four times a day (QID) | ORAL | Status: DC | PRN

## 2013-10-06 MED ORDER — HYDROCODONE-ACETAMINOPHEN 5-325 MG PO TABS
1.0000 | ORAL_TABLET | Freq: Once | ORAL | Status: AC
  Administered 2013-10-06: 1 via ORAL
  Filled 2013-10-06: qty 1

## 2013-10-06 MED ORDER — HYDROCODONE-ACETAMINOPHEN 5-325 MG PO TABS: 1.0000 | ORAL_TABLET | ORAL | Status: DC | PRN

## 2013-10-06 MED ORDER — CYCLOBENZAPRINE HCL 5 MG PO TABS: 5.0000 mg | ORAL_TABLET | Freq: Three times a day (TID) | ORAL | Status: DC | PRN

## 2013-10-06 NOTE — ED Notes (Signed)
Pt was lifting weights and felt a pop in his lower back. Later on today he sneezed and felt another pop.

## 2013-10-09 NOTE — ED Provider Notes (Signed)
CSN: [id number]     Arrival date & time 10/06/13  1910 History   First MD Initiated Contact with Patient 10/06/13 1959     Chief Complaint  Patient presents with  . Back Pain   (Consider location/radiation/quality/duration/timing/severity/associated sxs/prior Treatment) HPI Comments: Marvin Schroeder is a 37 y.o. Male presenting with acute on chronic low back pain which worsened today since he felt a "popping" sensation in his lower back since weight lifting at the gym this morning,  Then another "pop" when sneezing just prior to arrival here.   There is radiation of pain into his right lower extremity.  There has been no weakness or numbness in the lower extremities and no urinary or bowel retention or incontinence.  Patient does not have a history of cancer or IVDU. He does have a history of intermittent low back pain.      The history is provided by the patient.    Past Medical History  Diagnosis Date  . Back pain    Past Surgical History  Procedure Laterality Date  . Collarbone    . Orthopedic surgery     History reviewed. No pertinent family history. History  Substance Use Topics  . Smoking status: Former Smoker -- 0.50 packs/day    Types: Cigarettes    Quit date: 01/21/2013  . Smokeless tobacco: Not on file  . Alcohol Use: No    Review of Systems  Constitutional: Negative for fever.  Respiratory: Negative for shortness of breath.   Cardiovascular: Negative for chest pain and leg swelling.  Gastrointestinal: Negative for abdominal pain, constipation and abdominal distention.  Genitourinary: Negative for dysuria, urgency, frequency, flank pain and difficulty urinating.  Musculoskeletal: Positive for back pain. Negative for gait problem and joint swelling.  Skin: Negative for rash.  Neurological: Negative for weakness and numbness.    Allergies  Penicillins; Bee venom; and Xylocaine dental  Home Medications   Current Outpatient Rx  Name  Route  Sig  Dispense   Refill  . HYDROcodone-acetaminophen (NORCO/VICODIN) 5-325 MG per tablet   Oral   Take 1 tablet by mouth every 4 (four) hours as needed for moderate pain.   15 tablet   0   . ibuprofen (ADVIL,MOTRIN) 600 MG tablet   Oral   Take 1 tablet (600 mg total) by mouth every 6 (six) hours as needed.   30 tablet   0    BP 131/84  Pulse 77  Temp(Src) 98.1 F (36.7 C) (Oral)  Resp 20  Ht 6' (1.829 m)  Wt 245 lb (111.131 kg)  BMI 33.22 kg/m2  SpO2 100% Physical Exam  Nursing note and vitals reviewed. Constitutional: He appears well-developed and well-nourished.  HENT:  Head: Normocephalic.  Eyes: Conjunctivae are normal.  Neck: Normal range of motion. Neck supple.  Cardiovascular: Normal rate and intact distal pulses.   Pedal pulses normal.  Pulmonary/Chest: Effort normal.  Abdominal: Soft. Bowel sounds are normal. He exhibits no distension and no mass.  Musculoskeletal: Normal range of motion. He exhibits no edema.       Lumbar back: He exhibits tenderness. He exhibits no swelling, no edema and no spasm.       Back:  Neurological: He is alert. He has normal strength. He displays no atrophy and no tremor. No sensory deficit. Gait normal.  Reflex Scores:      Patellar reflexes are 2+ on the right side and 2+ on the left side.      Achilles reflexes are 2+ on  the right side and 2+ on the left side. No strength deficit noted in hip and knee flexor and extensor muscle groups.  Ankle flexion and extension intact.  Skin: Skin is warm and dry.  Psychiatric: He has a normal mood and affect.    ED Course  Procedures (including critical care time) Labs Review Labs Reviewed - No data to display Imaging Review No results found.  EKG Interpretation   None       MDM   1. Lumbar radiculopathy    No neuro deficit on exam or by history to suggest emergent or surgical presentation.  Also discussed worsened sx that should prompt immediate re-evaluation including distal weakness,  bowel/bladder retention/incontinence.  xrays reviewed with patient.  Suspect pt may possibly have new disk injury vs muscle spasm given nature of injury and now radiculopathy.  He was prescribed ibuprofen, flexeril, few hydrocodone.  Encouraged f/u with pcp (referral given).          Burgess Amor, PA-C 10/09/13 1246

## 2013-10-11 NOTE — ED Provider Notes (Signed)
Medical screening examination/treatment/procedure(s) were performed by non-physician practitioner and as supervising physician I was immediately available for consultation/collaboration.  EKG Interpretation   None       Yurem Viner, MD, FACEP   Lovada Barwick L Ferrah Panagopoulos, MD 10/11/13 1505 

## 2014-03-19 ENCOUNTER — Encounter (HOSPITAL_COMMUNITY): Payer: Self-pay | Admitting: Emergency Medicine

## 2014-03-19 ENCOUNTER — Emergency Department (HOSPITAL_COMMUNITY)
Admission: EM | Admit: 2014-03-19 | Discharge: 2014-03-19 | Disposition: A | Discharge status: Home / Self Care | Payer: PRIVATE HEALTH INSURANCE | Attending: Emergency Medicine | Admitting: Emergency Medicine

## 2014-03-19 ENCOUNTER — Emergency Department (HOSPITAL_COMMUNITY): Payer: PRIVATE HEALTH INSURANCE

## 2014-03-19 DIAGNOSIS — Z87891 Personal history of nicotine dependence: Secondary | ICD-10-CM | POA: Insufficient documentation

## 2014-03-19 DIAGNOSIS — S59909A Unspecified injury of unspecified elbow, initial encounter: Secondary | ICD-10-CM | POA: Insufficient documentation

## 2014-03-19 DIAGNOSIS — Y9269 Other specified industrial and construction area as the place of occurrence of the external cause: Secondary | ICD-10-CM | POA: Insufficient documentation

## 2014-03-19 DIAGNOSIS — X500XXA Overexertion from strenuous movement or load, initial encounter: Secondary | ICD-10-CM | POA: Insufficient documentation

## 2014-03-19 DIAGNOSIS — Y990 Civilian activity done for income or pay: Secondary | ICD-10-CM | POA: Insufficient documentation

## 2014-03-19 DIAGNOSIS — IMO0002 Reserved for concepts with insufficient information to code with codable children: Secondary | ICD-10-CM | POA: Insufficient documentation

## 2014-03-19 DIAGNOSIS — M25521 Pain in right elbow: Secondary | ICD-10-CM

## 2014-03-19 DIAGNOSIS — R209 Unspecified disturbances of skin sensation: Secondary | ICD-10-CM | POA: Insufficient documentation

## 2014-03-19 DIAGNOSIS — Y9389 Activity, other specified: Secondary | ICD-10-CM | POA: Insufficient documentation

## 2014-03-19 DIAGNOSIS — M545 Low back pain, unspecified: Secondary | ICD-10-CM

## 2014-03-19 DIAGNOSIS — S6990XA Unspecified injury of unspecified wrist, hand and finger(s), initial encounter: Secondary | ICD-10-CM

## 2014-03-19 DIAGNOSIS — S59919A Unspecified injury of unspecified forearm, initial encounter: Secondary | ICD-10-CM

## 2014-03-19 DIAGNOSIS — Z880 Allergy status to penicillin: Secondary | ICD-10-CM | POA: Insufficient documentation

## 2014-03-19 MED ORDER — MELOXICAM 7.5 MG PO TABS: 7.5000 mg | ORAL_TABLET | Freq: Every day | ORAL | Status: DC

## 2014-03-19 MED ORDER — CYCLOBENZAPRINE HCL 10 MG PO TABS
10.0000 mg | ORAL_TABLET | Freq: Once | ORAL | Status: AC
  Administered 2014-03-19: 10 mg via ORAL
  Filled 2014-03-19: qty 1

## 2014-03-19 MED ORDER — OXYCODONE-ACETAMINOPHEN 5-325 MG PO TABS: 1.0000 | ORAL_TABLET | ORAL | Status: DC | PRN

## 2014-03-19 MED ORDER — OXYCODONE-ACETAMINOPHEN 5-325 MG PO TABS
1.0000 | ORAL_TABLET | Freq: Once | ORAL | Status: AC
  Administered 2014-03-19: 1 via ORAL
  Filled 2014-03-19: qty 1

## 2014-03-19 NOTE — ED Notes (Signed)
Pt verbalized understanding to use caution and no driving within 4 hours of taking percocet due to med causes drowsiness, also made aware med causes constipation and stool softener/laxative may be needed
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
## 2014-03-19 NOTE — Discharge Instructions (Signed)
Follow up with Dr. Margo AyeHall for your back problems. Apply ice to the lower back and the right elbow. Return as needed for worsening symptoms.

## 2014-03-19 NOTE — ED Notes (Signed)
Low back pain, onset today when lifting a door--80 lbs

## 2014-03-20 NOTE — ED Provider Notes (Signed)
Medical screening examination/treatment/procedure(s) were performed by non-physician practitioner and as supervising physician I was immediately available for consultation/collaboration.   EKG Interpretation None        Kyan Yurkovich L Thelda Gagan, MD 03/20/14 2236 

## 2014-04-02 ENCOUNTER — Emergency Department (HOSPITAL_COMMUNITY)
Admission: EM | Admit: 2014-04-02 | Discharge: 2014-04-02 | Disposition: A | Discharge status: Home / Self Care | Payer: PRIVATE HEALTH INSURANCE | Attending: Emergency Medicine | Admitting: Emergency Medicine

## 2014-04-02 ENCOUNTER — Encounter (HOSPITAL_COMMUNITY): Payer: Self-pay | Admitting: Emergency Medicine

## 2014-04-02 DIAGNOSIS — M545 Low back pain, unspecified: Secondary | ICD-10-CM | POA: Insufficient documentation

## 2014-04-02 DIAGNOSIS — M549 Dorsalgia, unspecified: Secondary | ICD-10-CM

## 2014-04-02 DIAGNOSIS — Z791 Long term (current) use of non-steroidal anti-inflammatories (NSAID): Secondary | ICD-10-CM | POA: Insufficient documentation

## 2014-04-02 DIAGNOSIS — Z880 Allergy status to penicillin: Secondary | ICD-10-CM | POA: Insufficient documentation

## 2014-04-02 DIAGNOSIS — Z87891 Personal history of nicotine dependence: Secondary | ICD-10-CM | POA: Insufficient documentation

## 2014-04-02 MED ORDER — OXYCODONE-ACETAMINOPHEN 5-325 MG PO TABS: 1.0000 | ORAL_TABLET | ORAL | Status: DC | PRN

## 2014-04-02 MED ORDER — MELOXICAM 7.5 MG PO TABS: 7.5000 mg | ORAL_TABLET | Freq: Every day | ORAL | Status: DC

## 2014-04-02 NOTE — Discharge Instructions (Signed)
Back Pain, Adult Low back pain is very common. About 1 in 5 people have back pain.The cause of low back pain is rarely dangerous. The pain often gets better over time.About half of people with a sudden onset of back pain feel better in just 2 weeks. About 8 in 10 people feel better by 6 weeks.  CAUSES Some common causes of back pain include:  Strain of the muscles or ligaments supporting the spine.  Wear and tear (degeneration) of the spinal discs.  Arthritis.  Direct injury to the back. DIAGNOSIS Most of the time, the direct cause of low back pain is not known.However, back pain can be treated effectively even when the exact cause of the pain is unknown.Answering your caregiver's questions about your overall health and symptoms is one of the most accurate ways to make sure the cause of your pain is not dangerous. If your caregiver needs more information, he or she may order lab work or imaging tests (X-rays or MRIs).However, even if imaging tests show changes in your back, this usually does not require surgery. HOME CARE INSTRUCTIONS For many people, back pain returns.Since low back pain is rarely dangerous, it is often a condition that people can learn to manageon their own.   Remain active. It is stressful on the back to sit or stand in one place. Do not sit, drive, or stand in one place for more than 30 minutes at a time. Take short walks on level surfaces as soon as pain allows.Try to increase the length of time you walk each day.  Do not stay in bed.Resting more than 1 or 2 days can delay your recovery.  Do not avoid exercise or work.Your body is made to move.It is not dangerous to be active, even though your back may hurt.Your back will likely heal faster if you return to being active before your pain is gone.  Pay attention to your body when you bend and lift. Many people have less discomfortwhen lifting if they bend their knees, keep the load close to their bodies,and  avoid twisting. Often, the most comfortable positions are those that put less stress on your recovering back.  Find a comfortable position to sleep. Use a firm mattress and lie on your side with your knees slightly bent. If you lie on your back, put a pillow under your knees.  Only take over-the-counter or prescription medicines as directed by your caregiver. Over-the-counter medicines to reduce pain and inflammation are often the most helpful.Your caregiver may prescribe muscle relaxant drugs.These medicines help dull your pain so you can more quickly return to your normal activities and healthy exercise.  Put ice on the injured area.  Put ice in a plastic bag.  Place a towel between your skin and the bag.  Leave the ice on for 15-20 minutes, 03-04 times a day for the first 2 to 3 days. After that, ice and heat may be alternated to reduce pain and spasms.  Ask your caregiver about trying back exercises and gentle massage. This may be of some benefit.  Avoid feeling anxious or stressed.Stress increases muscle tension and can worsen back pain.It is important to recognize when you are anxious or stressed and learn ways to manage it.Exercise is a great option. SEEK MEDICAL CARE IF:  You have pain that is not relieved with rest or medicine.  You have pain that does not improve in 1 week.  You have new symptoms.  You are generally not feeling well. SEEK   IMMEDIATE MEDICAL CARE IF:   You have pain that radiates from your back into your legs.  You develop new bowel or bladder control problems.  You have unusual weakness or numbness in your arms or legs.  You develop nausea or vomiting.  You develop abdominal pain.  You feel faint. Document Released: 11/02/2005 Document Revised: 05/03/2012 Document Reviewed: 03/23/2011 [employer] Patient Information 2014 [employer].  

## 2014-04-02 NOTE — ED Notes (Signed)
Back pain, seen here 5/4 for same.

## 2014-04-02 NOTE — ED Provider Notes (Signed)
Medical screening examination/treatment/procedure(s) were performed by non-physician practitioner and as supervising physician I was immediately available for consultation/collaboration.  Trudi Morgenthaler T Candelaria Pies, MD 04/02/14 2315 

## 2014-04-02 NOTE — ED Provider Notes (Signed)
CSN: [id number]     Arrival date & time 04/02/14  1506 History  This chart was scribed for Trisha MangleKaren Sophia PA-Aworking with Toy BakerAnthony T Allen, MD by Ashley JacobsBrittany Andrews, ED scribe. This patient was seen in room APFT23/APFT23 and the patient's care was started at 4:42 PM.  None    Chief Complaint  Patient presents with  . Back Pain     (Consider location/radiation/quality/duration/timing/severity/associated sxs/prior Treatment) The history is provided by the patient and medical records. No language interpreter was used.   HPI Comments: Marvin Schroeder is a 38 y.o. male who presents to the Emergency Department complaining of persistent, unchanged, back pain. Pt was seen 5/4 for the same complaint. He denies having pain as severe as today. Pt was bitten by an insect in the past. Pt request a pain medication refill. He is not able to schedule an appointment as soon as he would like and requests a pain medication refill.     Past Medical History  Diagnosis Date  . Back pain    Past Surgical History  Procedure Laterality Date  . Collarbone    . Orthopedic surgery     History reviewed. No pertinent family history. History  Substance Use Topics  . Smoking status: Former Smoker -- 0.50 packs/day    Types: Cigarettes    Quit date: 01/21/2013  . Smokeless tobacco: Not on file  . Alcohol Use: No    Review of Systems  Musculoskeletal: Positive for back pain.      Allergies  Penicillins; Bee venom; and Xylocaine dental  Home Medications   Prior to Admission medications   Medication Sig Start Date End Date Taking? Authorizing Provider  meloxicam (MOBIC) 7.5 MG tablet Take 1 tablet (7.5 mg total) by mouth daily. 03/19/14   Hope Orlene OchM Neese, NP  oxyCODONE-acetaminophen (ROXICET) 5-325 MG per tablet Take 1 tablet by mouth every 4 (four) hours as needed for severe pain. 03/19/14   Hope Orlene OchM Neese, NP   BP 133/90  Pulse 83  Temp(Src) 98.1 F (36.7 C) (Oral)  Resp 20  Ht 6' (1.829 m)  Wt 243 lb  (110.224 kg)  BMI 32.95 kg/m2  SpO2 100% Physical Exam  Nursing note and vitals reviewed. Constitutional: He is oriented to person, place, and time. He appears well-developed and well-nourished.  HENT:  Head: Normocephalic and atraumatic.  Eyes: EOM are normal.  Neck: Normal range of motion.  Cardiovascular: Normal rate, regular rhythm, normal heart sounds and intact distal pulses.   Pulmonary/Chest: Effort normal and breath sounds normal. No respiratory distress.  Abdominal: Soft. He exhibits no distension. There is no tenderness.  Musculoskeletal: Normal range of motion.  Diffuse lumbar tenderness.   Neurological: He is alert and oriented to person, place, and time.  Skin: Skin is warm and dry.  Psychiatric: He has a normal mood and affect. Judgment normal.    ED Course  Procedures (including critical care time) DIAGNOSTIC STUDIES: Oxygen Saturation is 100% on room air, normal by my interpretation.    COORDINATION OF CARE:  4:26 PM Discussed course of care with pt . Pt understands and agrees.   Labs Review Labs Reviewed - No data to display  Imaging Review No results found.   EKG Interpretation None      MDM   Final diagnoses:  Back pain   meloxicam Percocet Follow up with Orthopaedist    Elson AreasLeslie K Hara Milholland, PA-C 04/02/14 1709

## 2014-04-21 ENCOUNTER — Emergency Department (HOSPITAL_COMMUNITY): Payer: Self-pay

## 2014-04-21 ENCOUNTER — Emergency Department (HOSPITAL_COMMUNITY)
Admission: EM | Admit: 2014-04-21 | Discharge: 2014-04-21 | Disposition: A | Discharge status: Home / Self Care | Payer: Self-pay | Attending: Emergency Medicine | Admitting: Emergency Medicine

## 2014-04-21 ENCOUNTER — Encounter (HOSPITAL_COMMUNITY): Payer: Self-pay | Admitting: Emergency Medicine

## 2014-04-21 DIAGNOSIS — IMO0002 Reserved for concepts with insufficient information to code with codable children: Secondary | ICD-10-CM | POA: Insufficient documentation

## 2014-04-21 DIAGNOSIS — S46912A Strain of unspecified muscle, fascia and tendon at shoulder and upper arm level, left arm, initial encounter: Secondary | ICD-10-CM

## 2014-04-21 DIAGNOSIS — Z880 Allergy status to penicillin: Secondary | ICD-10-CM | POA: Insufficient documentation

## 2014-04-21 DIAGNOSIS — Z87891 Personal history of nicotine dependence: Secondary | ICD-10-CM | POA: Insufficient documentation

## 2014-04-21 DIAGNOSIS — Y9389 Activity, other specified: Secondary | ICD-10-CM | POA: Insufficient documentation

## 2014-04-21 DIAGNOSIS — Y929 Unspecified place or not applicable: Secondary | ICD-10-CM | POA: Insufficient documentation

## 2014-04-21 DIAGNOSIS — X500XXA Overexertion from strenuous movement or load, initial encounter: Secondary | ICD-10-CM | POA: Insufficient documentation

## 2014-04-21 MED ORDER — OXYCODONE-ACETAMINOPHEN 5-325 MG PO TABS: 1.0000 | ORAL_TABLET | ORAL | Status: DC | PRN

## 2014-04-21 NOTE — ED Provider Notes (Signed)
CSN: [id number]     Arrival date & time 04/21/14  1543 History  This chart was scribed for Flint Melter, MD by Chestine Spore, ED Scribe. The patient was seen in room APA08/APA08 at 5:06 PM.   Chief Complaint  Patient presents with  . Joint Swelling   The history is provided by the patient. No language interpreter was used.   HPI Comments: SHI BAYERL is a 38 y.o. male who presents to the Emergency Department complaining of constant moderate, left elbow pain, with associated swelling, onset about 3 hours ago while pulling up a dolly holding furniture. Pt states that he is having trouble straightening the elbow. Pt states that he has iced the elbow with no relief. Pt denies any associated symptoms. Pt denies any medical history.    Past Medical History  Diagnosis Date  . Back pain    Past Surgical History  Procedure Laterality Date  . Collarbone    . Orthopedic surgery     No family history on file. History  Substance Use Topics  . Smoking status: Former Smoker -- 0.50 packs/day    Types: Cigarettes    Quit date: 01/21/2013  . Smokeless tobacco: Not on file  . Alcohol Use: No    Review of Systems  Cardiovascular: Negative for chest pain.  Gastrointestinal: Negative for abdominal pain.  Musculoskeletal: Positive for arthralgias (left elbow) and joint swelling.       Right elbow joint swelling.  Skin: Negative for wound.  Neurological: Negative for numbness.  All other systems reviewed and are negative.  Allergies  Penicillins; Bee venom; and Xylocaine dental  Home Medications   Prior to Admission medications   Medication Sig Start Date End Date Taking? Authorizing Provider  oxyCODONE-acetaminophen (PERCOCET) 5-325 MG per tablet Take 1 tablet by mouth every 4 (four) hours as needed. 04/21/14   Flint Melter, MD   BP 128/72  Pulse 92  Temp(Src) 98.3 F (36.8 C) (Oral)  Resp 18  Ht 6' (1.829 m)  Wt 245 lb (111.131 kg)  BMI 33.22 kg/m2  SpO2 95% Physical Exam   Nursing note and vitals reviewed. Constitutional: He is oriented to person, place, and time. He appears well-developed and well-nourished.  HENT:  Head: Normocephalic and atraumatic.  Right Ear: External ear normal.  Left Ear: External ear normal.  Eyes: Conjunctivae and EOM are normal. Pupils are equal, round, and reactive to light.  Neck: Normal range of motion and phonation normal. Neck supple.  Cardiovascular: Normal rate, regular rhythm, normal heart sounds and intact distal pulses.   Pulmonary/Chest: Effort normal and breath sounds normal. He exhibits no bony tenderness.  Abdominal: Soft. There is no tenderness.  Musculoskeletal: Normal range of motion. He exhibits tenderness.  Left elbow tenderness laterally and medially, laterally over radial head. Limited flexion, extension, pronation and supination due to pain.  Neurological: He is alert and oriented to person, place, and time. No cranial nerve deficit or sensory deficit. He exhibits normal muscle tone. Coordination normal.  Neurovascularly intact.  Skin: Skin is warm, dry and intact.  Psychiatric: He has a normal mood and affect. His behavior is normal. Judgment and thought content normal.    ED Course  Procedures (including critical care time)  DIAGNOSTIC STUDIES: Oxygen Saturation is 95% on room air, normal by my interpretation.    COORDINATION OF CARE: 5:09 PM-Discussed treatment plan with pt at bedside and pt agreed to plan.   Medications - No data to display  Patient Vitals  for the past 24 hrs:  BP Temp Temp src Pulse Resp SpO2 Height Weight  04/21/14 1732 128/71 mmHg - - 78 18 97 % - -  04/21/14 1548 128/72 mmHg 98.3 F (36.8 C) Oral 92 18 95 % 6' (1.829 m) 245 lb (111.131 kg)    Dg Elbow Complete Left  04/21/2014   CLINICAL DATA:  Elbow injury while lifting. Left elbow pain and swelling.  EXAM: LEFT ELBOW - COMPLETE 3+ VIEW  COMPARISON:  07/10/2010  FINDINGS: There is no evidence of fracture, dislocation, or  joint effusion. There is no evidence of arthropathy or other focal bone abnormality. Soft tissues are unremarkable.  IMPRESSION: Negative.   Electronically Signed   By: Myles RosenthalJohn  Stahl M.D.   On: 04/21/2014 16:13    EKG Interpretation None      MDM   Final diagnoses:  Strain of left elbow    Elbow injury without suspect fracture or ligamentous injury or acute tendon rupture.  Nursing Notes Reviewed/ Care Coordinated Applicable Imaging Reviewed Interpretation of Laboratory Data incorporated into ED treatment  The patient appears reasonably screened and/or stabilized for discharge and I doubt any other medical condition or other [facility] requiring further screening, evaluation, or treatment in the ED at this time prior to discharge.  Plan: Home Medications- Percocet #6; Home Treatments- rest, arm sling; return here if the recommended treatment, does not improve the symptoms; Recommended follow up- PCP prn  I personally performed the services described in this documentation, which was scribed in my presence. The recorded information has been reviewed and is accurate.       Flint MelterElliott L Venora Kautzman, MD 04/22/14 0001

## 2014-04-21 NOTE — ED Notes (Signed)
Pt states he was moving furniture and something popped in his left elbow

## 2014-04-21 NOTE — Discharge Instructions (Signed)
Take ibuprofen 400 mg 3 times a day, for pain Wear the sling for comfort Use ice on the sore area 3 times a day for 3 days See the doctor of your choice if not better in one or 2 weeks   Joint Sprain A sprain is a tear or stretch in the ligaments that hold a joint together. Severe sprains may need as long as 3-6 weeks of immobilization and/or exercises to heal completely. Sprained joints should be rested and protected. If not, they can become unstable and prone to re-injury. Proper treatment can reduce your pain, shorten the period of disability, and reduce the risk of repeated injuries. TREATMENT   Rest and elevate the injured joint to reduce pain and swelling.  Apply ice packs to the injury for 20-30 minutes every 2-3 hours for the next 2-3 days.  Keep the injury wrapped in a compression bandage or splint as long as the joint is painful or as instructed by your caregiver.  Do not use the injured joint until it is completely healed to prevent re-injury and chronic instability. Follow the instructions of your caregiver.  Long-term sprain management may require exercises and/or treatment by a physical therapist. Taping or special braces may help stabilize the joint until it is completely better. SEEK MEDICAL CARE IF:   You develop increased pain or swelling of the joint.  You develop increasing redness and warmth of the joint.  You develop a fever.  It becomes stiff.  Your hand or foot gets cold or numb. Document Released: 12/10/2004 Document Revised: 01/25/2012 Document Reviewed: 11/19/2008 [facility] Patient Information 2014 [place], [place].    Emergency Department Resource Guide 1) Find a Doctor and Pay Out of Pocket Although you won't have to find out who is covered by your insurance plan, it is a good idea to ask around and get recommendations. You will then need to call the office and see if the doctor you have chosen will accept you as a new patient and what types of options  they offer for patients who are self-pay. Some doctors offer discounts or will set up payment plans for their patients who do not have insurance, but you will need to ask so you aren't surprised when you get to your appointment.  2) Contact Your Local Health Department Not all health departments have doctors that can see patients for sick visits, but many do, so it is worth a call to see if yours does. If you don't know where your local health department is, you can check in your phone book. The CDC also has a tool to help you locate your state's health department, and many state websites also have listings of all of their local health departments.  3) Find a Walk-in Clinic If your illness is not likely to be very severe or complicated, you may want to try a walk in clinic. These are popping up all over the country in pharmacies, drugstores, and shopping centers. They're usually staffed by nurse practitioners or physician assistants that have been trained to treat common illnesses and complaints. They're usually fairly quick and inexpensive. However, if you have serious medical issues or chronic medical problems, these are probably not your best option.  No Primary Care Doctor: - Call Health Connect at  [phone] - they can help you locate a primary care doctor that  accepts your insurance, provides certain services, etc. - Physician Referral Service- [phone]  Chronic Pain Problems: Organization  Address  Phone   Notes  [facility]  [phone]) [phone] Patients need to be referred by their primary care doctor.   Medication Assistance: Organization         Address  Phone   Notes  [facility] [address] [place], [place] [address] [phone] --Must be a resident of [place] -- Must have NO insurance coverage whatsoever (no Medicaid/ Medicare, etc.) -- The pt. MUST have a primary care doctor that directs their  care regularly and follows them in the community   [facility]  [phone]   [employer]  [phone]    Agencies that provide inexpensive medical care: Organization         Address  Phone   Notes  [facility]  [phone]   [facility]    [phone]   [facility] [address] [place], [place] [address] [phone]   [facility] [address], [place] [phone]) [phone]   [facility]    [phone]   [facility]    [phone]) [phone]   [facility]  [address], [place] Phone:  [phone], Fax:  [phone] Hours of Operation:  9 am - 6 pm, M-F.  Also accepts Medicaid/Medicare and self-pay.  [facility]  [address], [place] Phone: [phone](336) [phone], Fax: [phone](336) [phone]. Hours of Operation:  8:30 am - 5:30 pm, M-F.  Also accepts Medicaid and self-pay.  [facility] [place] [address], [place] Phone: [phone]) [phone]   [facility] [address], [place], [place] [phone], Ext. [phone] Mondays & Thursdays: 7-9 AM.  First 15 patients are seen on a first come, first serve basis.    Medicaid-accepting [facility] Providers:  Organization         Address  Phone   Notes  [facility] [address], [place] [phone] Also accepts self-pay patients.  [facility] [address] [place], [place]  [phone]) [phone]   [facility] [address], [place] [phone]   [facility] [address] [place] [address], [place] [phone]) [phone]   Renaye RakersVeita Bland [address], [place]   [phone] Only accepts [place] Access [place] patients after they have their name applied to their card.   Self-Pay (no insurance) in [facility] [place]:  Organization         Address  Phone    Notes  Sickle Cell Patients, [facility] [address], [place] [phone]   [facility] [address], [place] [phone]   [facility] [place]  [address], [place] [phone]   [facility]/Dr. Osei-Bonsu  [address] [place] [address], [place] or [id number], [place] [phone] Phone number for both [place] and [place] locations is the same.  [facility] [address], [place] [phone]   [facility] [place] [address] [place] [address], [place] or [address] [phone]) [phone]   [facility] [address], [place] [phone]) [phone], phone; [phone]) [phone], fax Sees patients 1st and 3rd Saturday of every month.  Must not qualify  for public or private insurance (i.e. Medicaid, Medicare, [place] Health Choice, Veterans' Benefits)  Household income should be no more than 200% of the poverty level The clinic cannot treat you if you are pregnant or think you are pregnant  Sexually transmitted diseases are not treated at the clinic.    Dental Care: Organization         Address  Phone  Notes  [facility] of [facility] [phone] Accepts children up to age 13 who are enrolled in [place] or [place]; pregnant women with a Medicaid card; and children who have applied for Medicaid or [place] Health Choice, but were declined, whose parents can pay a reduced fee at time of service.  [facility] Department of [facility]  [address], [place] [phone] Accepts children up to age 65 who are enrolled in [place] or [place]; pregnant women with a Medicaid card; and children who have applied for Medicaid or [place] Health Choice, but were declined, whose parents can pay a reduced fee at time of service.  [place]  Adult Dental Access PROGRAM  [place] [phone] Patients are seen by appointment only. Walk-ins are not accepted. [place] will see patients 31 years of age and older. Monday - Tuesday (8am-5pm) Most Wednesdays (8:30-5pm) $30 per visit, cash only  [facility] Adult Dental Access PROGRAM  [address], [facility] [phone] Patients are seen by appointment only. Walk-ins are not accepted. [place] will see patients 59 years of age and older. One Wednesday Evening (Monthly: Volunteer Based).  $30 per visit, cash only  [employer]  [phone] for adults; Children under age 18, call Graduate Pediatric Dentistry at [phone]. Children aged 37-14, please call [phone] to request a pediatric application.  Dental services are provided in all areas of dental care including fillings, crowns and bridges, complete and partial dentures, implants, gum treatment, root canals, and extractions. Preventive care is also provided. Treatment is provided to both adults and children. Patients are selected via a lottery and there is often a waiting list.   [facility] [address], [place]  [phone] [email or web address]   [facility] [address] [place], [place] [phone], Ext. [phone] Second and Fourth Thursday of each month, opens at 6:30 AM; Clinic ends at 9 AM.  Patients are seen on a first-come first-served basis, and a limited number are seen during each clinic.   [facility]  [address] [place]ard Danker [place], [place] [phone]   Eligibility Requirements You must have lived in [place], [place], or [place] counties for at least the last three months.   You cannot be eligible for state or federal sponsored [employer], including [employer], [place], or [employer].   You generally cannot be eligible for healthcare insurance through your employer.    How to  apply: Eligibility screenings are held every Tuesday and Wednesday afternoon from 1:00 pm until 4:00 pm. You do not need an appointment for the interview!  [facility] [address] [place], [place]   [place] Department  [place]  [phone]    Behavioral Health Resources in the Community: Intensive Outpatient Programs Organization         Address  Phone  Notes  °[place] [facility] [address], [place], [place] [phone]   °[facility] [address], [place], [place] [phone]   °[facility] [address], [place], [place] ° 336-882-2125   °[facility] [address],  °[place], [place] 1-800-853-5163 or 336-641-4981   °Substance Abuse Resources °Organization         Address  Phone  Notes  °[facility]  336-882-2125   °[facility]  336-784-9470   °[facility]  336-285-9073   °[facility]  336-845-3988   °Residential & Outpatient Substance Abuse Program  1-800-659-3381   °Psychological Services °Organization         Address  Phone  Notes  °[facility]  336- [phone]   °[facility]  336- 378-7881   °[facility] [address], [place] 1-800-853-5163 or 336-641-4981   ° °Mobile Crisis Teams °Organization         Address  Phone  Notes  °[facility], Mobile Crisis Care Unit  1-877-626-1772   °Assertive °Psychotherapeutic Services ° [address] [place], [place] 336-834-9664   °Sharon DeEsch [address] °[place] 336-554-5454   ° °Self-Help/Support Groups °Organization         Address  Phone             Notes  °[facility] [place] - variety of support groups  336- 373-1402 Call for more information  °Narcotics Anonymous (NA), [facility] [address], °[place]  2 meetings at this location  ° °Residential Treatment Programs °Organization         Address  Phone  Notes  °[facility] [address],    °[place]  1-866-801-8205   °[facility] ° [address], [place], [place] [phone]   °[facility] [address], [place] 336-845-3988 Admissions: 8am-3pm M-F  °[facility] [address]    °[place], [place] 336-841-1104   °[facility] [address], [place], [place] 336-379-7146   °[facility] [address]  °[place], [place] 336-285-9073   °[facility] - Intensive Outpatient [address], [place], [place] 336-852-3033   °[facility] ([facility] [address]  °[place], [place] 1-877-615-2722 or 336-784-9470   °[facility] ([facility]) [address] [place], [place] 336-227-7417 Accepts Medicaid  °[facility] [address]  °[place] 1-800-659-3381 Substance Abuse/Addiction Treatment  ° °[place] Behavioral Health Resources °Organization         Address  Phone  Notes  °[facility]  (888) 581-9988   °Julie Brannon, PhD [address] [place], [place]   (336) 349-5553 or (336) 951-0000   °[facility]   [address] °[place], [place] (336) 349-4454   °[facility] [address], [place], [place] (336) 342-8316 Insurance/Medicaid/sponsorship through [facility]  °[facility] [address]                                    [place],  (336) 342-8316 Therapy/tele-psych/case  °[facility] [address]  ° ,  (336) 349-2233    °Dr. Arfeen  (336) 349-4544   °[facility] [place]  [employer] [place]   [facility] 1) [address], [place] 2) [address], [place] 3)  [address], [place] [phone]) [phone]) [phone]  [phone]) [phone]   [facility] Child Abuse Hotline  206-427-9091(336) (901)382-8810 or [phone](336) [phone] (After Hours)

## 2014-04-30 ENCOUNTER — Emergency Department (HOSPITAL_COMMUNITY)
Admission: EM | Admit: 2014-04-30 | Discharge: 2014-04-30 | Disposition: A | Discharge status: Home / Self Care | Payer: Self-pay | Attending: Emergency Medicine | Admitting: Emergency Medicine

## 2014-04-30 ENCOUNTER — Encounter (HOSPITAL_COMMUNITY): Payer: Self-pay | Admitting: Emergency Medicine

## 2014-04-30 DIAGNOSIS — S39012A Strain of muscle, fascia and tendon of lower back, initial encounter: Secondary | ICD-10-CM

## 2014-04-30 DIAGNOSIS — X500XXA Overexertion from strenuous movement or load, initial encounter: Secondary | ICD-10-CM | POA: Insufficient documentation

## 2014-04-30 DIAGNOSIS — Y929 Unspecified place or not applicable: Secondary | ICD-10-CM | POA: Insufficient documentation

## 2014-04-30 DIAGNOSIS — Z880 Allergy status to penicillin: Secondary | ICD-10-CM | POA: Insufficient documentation

## 2014-04-30 DIAGNOSIS — S335XXA Sprain of ligaments of lumbar spine, initial encounter: Secondary | ICD-10-CM | POA: Insufficient documentation

## 2014-04-30 DIAGNOSIS — Y9389 Activity, other specified: Secondary | ICD-10-CM | POA: Insufficient documentation

## 2014-04-30 DIAGNOSIS — Z87891 Personal history of nicotine dependence: Secondary | ICD-10-CM | POA: Insufficient documentation

## 2014-04-30 MED ORDER — OXYCODONE-ACETAMINOPHEN 5-325 MG PO TABS
1.0000 | ORAL_TABLET | Freq: Once | ORAL | Status: AC
  Administered 2014-04-30: 1 via ORAL
  Filled 2014-04-30: qty 1

## 2014-04-30 MED ORDER — HYDROCODONE-ACETAMINOPHEN 5-325 MG PO TABS: ORAL_TABLET | ORAL | Status: DC

## 2014-04-30 NOTE — ED Notes (Signed)
Low back pain , has chronic back problems. Worse since moving into his home.  Lt arm is in sling from previous injury.

## 2014-04-30 NOTE — ED Notes (Signed)
Pt reports has been moving into a new house and thinks caused his back pain to flare up.

## 2014-04-30 NOTE — Discharge Instructions (Signed)
Back Pain, Adult °Back pain is very common. The pain often gets better over time. The cause of back pain is usually not dangerous. Most people can learn to manage their back pain on their own.  °HOME CARE  °· Stay active. Start with short walks on flat ground if you can. Try to walk farther each day. °· Do not sit, drive, or stand in one place for more than 30 minutes. Do not stay in bed. °· Do not avoid exercise or work. Activity can help your back heal faster. °· Be careful when you bend or lift an object. Bend at your knees, keep the object close to you, and do not twist. °· Sleep on a firm mattress. Lie on your side, and bend your knees. If you lie on your back, put a pillow under your knees. °· Only take medicines as told by your doctor. °· Put ice on the injured area. °· Put ice in a plastic bag. °· Place a towel between your skin and the bag. °· Leave the ice on for 15-20 minutes, 03-04 times a day for the first 2 to 3 days. After that, you can switch between ice and heat packs. °· Ask your doctor about back exercises or massage. °· Avoid feeling anxious or stressed. Find good ways to deal with stress, such as exercise. °GET HELP RIGHT AWAY IF:  °· Your pain does not go away with rest or medicine. °· Your pain does not go away in 1 week. °· You have new problems. °· You do not feel well. °· The pain spreads into your legs. °· You cannot control when you poop (bowel movement) or pee (urinate). °· Your arms or legs feel weak or lose feeling (numbness). °· You feel sick to your stomach (nauseous) or throw up (vomit). °· You have belly (abdominal) pain. °· You feel like you may pass out (faint). °MAKE SURE YOU:  °· Understand these instructions. °· Will watch your condition. °· Will get help right away if you are not doing well or get worse. °Document Released: 04/20/2008 Document Revised: 01/25/2012 Document Reviewed: 03/23/2011 °ExitCare® Patient Information ©2014 [employer] LLC. ° °Lumbosacral  Strain °Lumbosacral strain is a strain of any of the parts that make up your lumbosacral vertebrae. Your lumbosacral vertebrae are the bones that make up the lower third of your backbone. Your lumbosacral vertebrae are held together by muscles and tough, fibrous tissue (ligaments).  °CAUSES  °A sudden blow to your back can cause lumbosacral strain. Also, anything that causes an excessive stretch of the muscles in the low back can cause this strain. This is typically seen when people exert themselves strenuously, fall, lift heavy objects, bend, or crouch repeatedly. °RISK FACTORS °· Physically demanding work. °· Participation in pushing or pulling sports or sports that require sudden twist of the back (tennis, golf, baseball). °· Weight lifting. °· Excessive lower back curvature. °· Forward-tilted pelvis. °· Weak back or abdominal muscles or both. °· Tight hamstrings. °SIGNS AND SYMPTOMS  °Lumbosacral strain may cause pain in the area of your injury or pain that moves (radiates) down your leg.  °DIAGNOSIS °Your health care provider can often diagnose lumbosacral strain through a physical exam. In some cases, you may need tests such as X-ray exams.  °TREATMENT  °Treatment for your lower back injury depends on many factors that your clinician will have to evaluate. However, most treatment will include the use of anti-inflammatory medicines. °HOME CARE INSTRUCTIONS  °· Avoid hard physical activities (tennis,   racquetball, waterskiing) if you are not in proper physical condition for it. This may aggravate or create problems. °· If you have a back problem, avoid sports requiring sudden body movements. Swimming and walking are generally safer activities. °· Maintain good posture. °· Maintain a healthy weight. °· For acute conditions, you may put ice on the injured area. °· Put ice in a plastic bag. °· Place a towel between your skin and the bag. °· Leave the ice on for 20 minutes, 2 3 times a day. °· When the low back  starts healing, stretching and strengthening exercises may be recommended. °SEEK MEDICAL CARE IF: °· Your back pain is getting worse. °· You experience severe back pain not relieved with medicines. °SEEK IMMEDIATE MEDICAL CARE IF:  °· You have numbness, tingling, weakness, or problems with the use of your arms or legs. °· There is a change in bowel or bladder control. °· You have increasing pain in any area of the body, including your belly (abdomen). °· You notice shortness of breath, dizziness, or feel faint. °· You feel sick to your stomach (nauseous), are throwing up (vomiting), or become sweaty. °· You notice discoloration of your toes or legs, or your feet get very cold. °MAKE SURE YOU:  °· Understand these instructions. °· Will watch your condition. °· Will get help right away if you are not doing well or get worse. °Document Released: 08/12/2005 Document Revised: 08/23/2013 Document Reviewed: 06/21/2013 °ExitCare® Patient Information ©2014 [employer] LLC. ° °

## 2014-04-30 NOTE — ED Provider Notes (Signed)
CSN: [id number]     Arrival date & time 04/30/14  1201 History  This chart was scribed for non-physician practitioner, Kinaya Hilliker L. Trisha Mangleriplett, PA-C working with Benny LennertJoseph L Zammit, MD by Luisa DagoPriscilla Tutu, ED scribe. This patient was seen in room APFT22/APFT22 and the patient's care was started at 2:06 PM.    Chief Complaint  Patient presents with  . Back Pain   Patient is a 38 y.o. male presenting with back pain. The history is provided by the patient. No language interpreter was used.  Back Pain Location:  Lumbar spine Quality:  Burning Radiates to:  R posterior upper leg, R thigh and R knee Pain severity:  Moderate Pain is:  Same all the time Onset quality:  Gradual Duration:  1 day Timing:  Constant Progression:  Unchanged Chronicity:  Recurrent Context: lifting heavy objects and twisting   Context: not falling   Relieved by:  Nothing Worsened by:  Movement, bending and twisting Ineffective treatments:  None tried Associated symptoms: leg pain   Associated symptoms: no abdominal pain, no bladder incontinence, no bowel incontinence, no chest pain, no dysuria, no fever, no headaches, no numbness, no paresthesias, no pelvic pain, no perianal numbness, no tingling and no weakness    HPI Comments: Marvin Schroeder is a 38 y.o. male who presents to the Emergency Department complaining of worsening back pain that started approximately last night. Pt states that he currently moved to a new house and he was moving a cough early this morning he felt his back pop. He describes the pains as "burning". He states that the pain radiates up his neck and down his right leg. Pt states that he has a scheduled visit with pain management next month. Pt states that he is currently taking Flexiril and Percocet. [name] also states that he was prescribed Ibuprofen 400, which he takes as needed.  He denies any weakness to his legs, dysuria, bladder or bowel incontinence.  Past Medical History  Diagnosis Date   . Back pain    Past Surgical History  Procedure Laterality Date  . Collarbone    . Orthopedic surgery     No family history on file. History  Substance Use Topics  . Smoking status: Former Smoker -- 0.50 packs/day    Types: Cigarettes    Quit date: 01/21/2013  . Smokeless tobacco: Not on file  . Alcohol Use: No    Review of Systems  Constitutional: Negative for fever.  Cardiovascular: Negative for chest pain.  Gastrointestinal: Negative for nausea, vomiting, abdominal pain and bowel incontinence.  Genitourinary: Negative for bladder incontinence, dysuria, hematuria, flank pain and pelvic pain.  Musculoskeletal: Positive for back pain. Negative for joint swelling, neck pain and neck stiffness.  Skin: Negative for color change and rash.  Neurological: Negative for tingling, weakness, numbness, headaches and paresthesias.  All other systems reviewed and are negative.  Allergies  Penicillins; Bee venom; and Xylocaine dental  Home Medications   Prior to Admission medications   Medication Sig Start Date End Date Taking? Authorizing Provider  oxyCODONE-acetaminophen (PERCOCET) 5-325 MG per tablet Take 1 tablet by mouth every 4 (four) hours as needed. 04/21/14   Flint MelterElliott L Wentz, MD   Triage Vitals: BP 138/75  Pulse 64  Temp(Src) 97.9 F (36.6 C) (Oral)  Resp 20  Ht 6' (1.829 m)  Wt 242 lb (109.77 kg)  BMI 32.81 kg/m2  SpO2 99%  Physical Exam  Nursing note and vitals reviewed. Constitutional: He is oriented to person, place,  and time. He appears well-developed and well-nourished. No distress.  HENT:  Head: Normocephalic and atraumatic.  Eyes: Conjunctivae and EOM are normal.  Neck: Normal range of motion. Neck supple. No tracheal deviation present.  Cardiovascular: Normal rate, regular rhythm, normal heart sounds and intact distal pulses.   No murmur heard. Pulmonary/Chest: Effort normal and breath sounds normal. No respiratory distress.  Abdominal: Soft. He exhibits no  distension. There is no tenderness. There is no rebound and no guarding.  Musculoskeletal: Normal range of motion. He exhibits tenderness. He exhibits no edema.       Lumbar back: He exhibits tenderness and pain. He exhibits normal range of motion, no swelling, no deformity, no laceration and normal pulse.  Localized tend to palpation right lumbar paraspinal muscles. No spinal tenderness. 5/5 strength against resistance bilaterally to lower extremities. DP pulse brisk, distal sensation intact.  Compartments of the RLE are soft.  Neurological: He is alert and oriented to person, place, and time. He has normal strength. No sensory deficit. He exhibits normal muscle tone. Coordination and gait normal.  Reflex Scores:      Patellar reflexes are 2+ on the right side and 2+ on the left side.      Achilles reflexes are 2+ on the right side and 2+ on the left side. Skin: Skin is warm and dry. No rash noted.  Psychiatric: He has a normal mood and affect. His behavior is normal.    ED Course  Procedures (including critical care time)  DIAGNOSTIC STUDIES: Oxygen Saturation is 99% on RA, normal by my interpretation.    COORDINATION OF CARE: 2:13 PM- Pt advised of plan for treatment and pt agrees. Pt is requesting some pain medication. Advised pt to alternate between cold and warm compresses and close PMD f/u and return if sx's worsen.    Medications  oxyCODONE-acetaminophen (PERCOCET/ROXICET) 5-325 MG per tablet 1 tablet (not administered)    Labs Review Labs Reviewed - No data to display  Imaging Review No results found.   EKG Interpretation None      MDM   2:16 PM- pt was reviewed on the [place] narcotic database. Received 6 Percocets on 04/21/14.   Final diagnoses:  Lumbar strain    Pt is well appearing, non-toxic.  Acute on chronic low back pain and radiculopathy after heavy lifting.  No reported hx of trauma.  No focal neuro deficits, pt ambulates with steady gait.  No concerning sx;s  for emergent neurological or infectious process.   I personally performed the services described in this documentation, which was scribed in my presence. The recorded information has been reviewed and is accurate.     Jakaiya Netherland L. Trisha Mangleriplett, PA-C 05/03/14 1729

## 2014-05-04 NOTE — ED Provider Notes (Signed)
Medical screening examination/treatment/procedure(s) were performed by non-physician practitioner and as supervising physician I was immediately available for consultation/collaboration.   EKG Interpretation None        Kadajah Kjos L Maryl Blalock, MD 05/04/14 1549 

## 2014-05-15 ENCOUNTER — Emergency Department (HOSPITAL_COMMUNITY)
Admission: EM | Admit: 2014-05-15 | Discharge: 2014-05-15 | Disposition: A | Discharge status: Home / Self Care | Payer: Self-pay | Attending: Emergency Medicine | Admitting: Emergency Medicine

## 2014-05-15 ENCOUNTER — Encounter (HOSPITAL_COMMUNITY): Payer: Self-pay | Admitting: Emergency Medicine

## 2014-05-15 DIAGNOSIS — Z880 Allergy status to penicillin: Secondary | ICD-10-CM | POA: Insufficient documentation

## 2014-05-15 DIAGNOSIS — Y9289 Other specified places as the place of occurrence of the external cause: Secondary | ICD-10-CM | POA: Insufficient documentation

## 2014-05-15 DIAGNOSIS — IMO0002 Reserved for concepts with insufficient information to code with codable children: Secondary | ICD-10-CM | POA: Insufficient documentation

## 2014-05-15 DIAGNOSIS — S39012A Strain of muscle, fascia and tendon of lower back, initial encounter: Secondary | ICD-10-CM

## 2014-05-15 DIAGNOSIS — Z87891 Personal history of nicotine dependence: Secondary | ICD-10-CM | POA: Insufficient documentation

## 2014-05-15 DIAGNOSIS — Y990 Civilian activity done for income or pay: Secondary | ICD-10-CM | POA: Insufficient documentation

## 2014-05-15 DIAGNOSIS — Y9389 Activity, other specified: Secondary | ICD-10-CM | POA: Insufficient documentation

## 2014-05-15 MED ORDER — HYDROMORPHONE HCL PF 1 MG/ML IJ SOLN
1.0000 mg | Freq: Once | INTRAMUSCULAR | Status: AC
  Administered 2014-05-15: 1 mg via INTRAMUSCULAR
  Filled 2014-05-15: qty 1

## 2014-05-15 MED ORDER — IBUPROFEN 400 MG PO TABS
600.0000 mg | ORAL_TABLET | Freq: Once | ORAL | Status: AC
  Administered 2014-05-15: 600 mg via ORAL
  Filled 2014-05-15: qty 2

## 2014-05-15 MED ORDER — DIAZEPAM 5 MG PO TABS
5.0000 mg | ORAL_TABLET | Freq: Once | ORAL | Status: AC
  Administered 2014-05-15: 5 mg via ORAL
  Filled 2014-05-15: qty 1

## 2014-05-15 NOTE — ED Provider Notes (Signed)
CSN: [id number]     Arrival date & time 05/15/14  0440 History   First MD Initiated Contact with Patient 05/15/14 [id number]     Chief Complaint  Patient presents with  . Back Pain     (Consider location/radiation/quality/duration/timing/severity/associated sxs/prior Treatment) HPI  38 year old male with lower back pain. Patient was work unloading a truck when he began having progressively worsening pain in his lower back. Does not radiate. No numbness or tingling. No urinary complaints. No use of blood thinners. No past surgical history on his back. No fevers or chills. Denies any drug abuse. No intervention prior to arrival.  Past Medical History  Diagnosis Date  . Back pain    Past Surgical History  Procedure Laterality Date  . Collarbone    . Orthopedic surgery     No family history on file. History  Substance Use Topics  . Smoking status: Former Smoker -- 0.50 packs/day    Types: Cigarettes    Quit date: 01/21/2013  . Smokeless tobacco: Not on file  . Alcohol Use: No    Review of Systems  All systems reviewed and negative, other than as noted in HPI.   Allergies  Penicillins; Bee venom; and Xylocaine dental  Home Medications   Prior to Admission medications   Medication Sig Start Date End Date Taking? Authorizing Provider  HYDROcodone-acetaminophen (NORCO/VICODIN) 5-325 MG per tablet Take one-two tabs po q 4-6 hrs prn pain 04/30/14   [name] L. Triplett, PA-C  oxyCODONE-acetaminophen (PERCOCET) 5-325 MG per tablet Take 1 tablet by mouth every 4 (four) hours as needed. 04/21/14   Flint MelterElliott L Wentz, MD   There were no vitals taken for this visit. Physical Exam  Nursing note and vitals reviewed. Constitutional: He appears well-developed and well-nourished. No distress.  HENT:  Head: Normocephalic and atraumatic.  Eyes: Conjunctivae are normal. Right eye exhibits no discharge. Left eye exhibits no discharge.  Neck: Neck supple.  Cardiovascular: Normal rate, regular rhythm  and normal heart sounds.  Exam reveals no gallop and no friction rub.   No murmur heard. Pulmonary/Chest: Effort normal and breath sounds normal. No respiratory distress.  Abdominal: Soft. He exhibits no distension. There is no tenderness.  Musculoskeletal: He exhibits no edema and no tenderness.       Arms: Tenderness to palpation in the picture the area. No concerning skin changes. No crepitus.  Neurological: He is alert.  Ambulating under own power. Gait appears steady. Strength is 5 out 5 bilateral lower extremities. Sensation is intact to light touch. Palpable DP pulses. Patellar reflexes are 2+ bilaterally.  Skin: Skin is warm and dry.  Psychiatric: He has a normal mood and affect. His behavior is normal. Thought content normal.    ED Course  Procedures (including critical care time) Labs Review Labs Reviewed - No data to display  Imaging Review No results found.   EKG Interpretation None      MDM   Final diagnoses:  Back strain, initial encounter    38 year old male with back pain. Likely strain. Nonfocal neurological examination. No blood thinners. Denies IV drug use. No urinary complaints. Plan symptomatic treatment. Patient was given a dose of Valium and Dilaudid in the emergency room. Instructed to take NSAIDs for continued pain. Patient has been seen numerous times for back pain over the past couple years and this is his fifth ER evaluation for back pain in the past 7 months. I did not feel comfortable prescribing narcotics for what seems to be an exacerbation of  his chronic pain. Return precautions were discussed. Outpatient followup otherwise.    Raeford RazorStephen Kohut, MD 05/16/14 [phone]

## 2014-05-15 NOTE — Discharge Instructions (Signed)
Back Exercises °Back exercises help treat and prevent back injuries. The goal of back exercises is to increase the strength of your abdominal and back muscles and the flexibility of your back. These exercises should be started when you no longer have back pain. Back exercises include: °· Pelvic Tilt. Lie on your back with your knees bent. Tilt your pelvis until the lower part of your back is against the floor. Hold this position 5 to 10 sec and repeat 5 to 10 times. °· Knee to Chest. Pull first 1 knee up against your chest and hold for 20 to 30 seconds, repeat this with the other knee, and then both knees. This may be done with the other leg straight or bent, whichever feels better. °· Sit-Ups or Curl-Ups. Bend your knees 90 degrees. Start with tilting your pelvis, and do a partial, slow sit-up, lifting your trunk only 30 to 45 degrees off the floor. Take at least 2 to 3 seconds for each sit-up. Do not do sit-ups with your knees out straight. If partial sit-ups are difficult, simply do the above but with only tightening your abdominal muscles and holding it as directed. °· Hip-Lift. Lie on your back with your knees flexed 90 degrees. Push down with your feet and shoulders as you raise your hips a couple inches off the floor; hold for 10 seconds, repeat 5 to 10 times. °· Back arches. Lie on your stomach, propping yourself up on bent elbows. Slowly press on your hands, causing an arch in your low back. Repeat 3 to 5 times. Any initial stiffness and discomfort should lessen with repetition over time. °· Shoulder-Lifts. Lie face down with arms beside your body. Keep hips and torso pressed to floor as you slowly lift your head and shoulders off the floor. °Do not overdo your exercises, especially in the beginning. Exercises may cause you some mild back discomfort which lasts for a few minutes; however, if the pain is more severe, or lasts for more than 15 minutes, do not continue exercises until you see your caregiver.  Improvement with exercise therapy for back problems is slow.  °See your caregivers for assistance with developing a proper back exercise program. °Document Released: 12/10/2004 Document Revised: 01/25/2012 Document Reviewed: 09/03/2011 °ExitCare® Patient Information ©2015 [employer]. This information is not intended to replace advice given to you by your health care provider. Make sure you discuss any questions you have with your health care provider. ° °Back Pain, Adult °Low back pain is very common. About 1 in 5 people have back pain. The cause of low back pain is rarely dangerous. The pain often gets better over time. About half of people with a sudden onset of back pain feel better in just 2 weeks. About 8 in 10 people feel better by 6 weeks.  °CAUSES °Some common causes of back pain include: °· Strain of the muscles or ligaments supporting the spine. °· Wear and tear (degeneration) of the spinal discs. °· Arthritis. °· Direct injury to the back. °DIAGNOSIS °Most of the time, the direct cause of low back pain is not known. However, back pain can be treated effectively even when the exact cause of the pain is unknown. Answering your caregiver's questions about your overall health and symptoms is one of the most accurate ways to make sure the cause of your pain is not dangerous. If your caregiver needs more information, he or she may order lab work or imaging tests (X-rays or MRIs). However, even if imaging tests show changes in your   back, this usually does not require surgery. °HOME CARE INSTRUCTIONS °For many people, back pain returns. Since low back pain is rarely dangerous, it is often a condition that people can learn to manage on their own.  °· Remain active. It is stressful on the back to sit or stand in one place. Do not sit, drive, or stand in one place for more than 30 minutes at a time. Take short walks on level surfaces as soon as pain allows. Try to increase the length of time you walk each  day. °· Do not stay in bed. Resting more than 1 or 2 days can delay your recovery. °· Do not avoid exercise or work. Your body is made to move. It is not dangerous to be active, even though your back may hurt. Your back will likely heal faster if you return to being active before your pain is gone. °· Pay attention to your body when you  bend and lift. Many people have less discomfort when lifting if they bend their knees, keep the load close to their bodies, and avoid twisting. Often, the most comfortable positions are those that put less stress on your recovering back. °· Find a comfortable position to sleep. Use a firm mattress and lie on your side with your knees slightly bent. If you lie on your back, put a pillow under your knees. °· Only take over-the-counter or prescription medicines as directed by your caregiver. Over-the-counter medicines to reduce pain and inflammation are often the most helpful. Your caregiver may prescribe muscle relaxant drugs. These medicines help dull your pain so you can more quickly return to your normal activities and healthy exercise. °· Put ice on the injured area. °¨ Put ice in a plastic bag. °¨ Place a towel between your skin and the bag. °¨ Leave the ice on for 15-20 minutes, 03-04 times a day for the first 2 to 3 days. After that, ice and heat may be alternated to reduce pain and spasms. °· Ask your caregiver about trying back exercises and gentle massage. This may be of some benefit. °· Avoid feeling anxious or stressed. Stress increases muscle tension and can worsen back pain. It is important to recognize when you are anxious or stressed and learn ways to manage it. Exercise is a great option. °SEEK MEDICAL CARE IF: °· You have pain that is not relieved with rest or medicine. °· You have pain that does not improve in 1 week. °· You have new symptoms. °· You are generally not feeling well. °SEEK IMMEDIATE MEDICAL CARE IF:  °· You have pain that radiates from your back into  your legs. °· You develop new bowel or bladder control problems. °· You have unusual weakness or numbness in your arms or legs. °· You develop nausea or vomiting. °· You develop abdominal pain. °· You feel faint. °Document Released: 11/02/2005 Document Revised: 05/03/2012 Document Reviewed: 03/23/2011 °ExitCare® Patient Information ©2015 [employer]. This information is not intended to replace advice given to you by your health care provider. Make sure you discuss any questions you have with your health care provider. ° ° °Emergency Department Resource Guide °1) Find a Doctor and Pay Out of Pocket °Although you won't have to find out who is covered by your insurance plan, it is a good idea to ask around and get recommendations. You will then need to call the office and see if the doctor you have chosen will accept you as a new patient and what types of options they offer for patients who are self-pay. Some doctors offer discounts or will   set up payment plans for their patients who do not have insurance, but you will need to ask so you aren't surprised when you get to your appointment. ° °2) Contact Your Local Health Department °Not all health departments have doctors that can see patients for sick visits, but many do, so it is worth a call to see if yours does. If you don't know where your local health department is, you can check in your phone book. The CDC also has a tool to help you locate your state's health department, and many state websites also have listings of all of their local health departments. ° °3) Find a Walk-in Clinic °If your illness is not likely to be very severe or complicated, you may want to try a walk in clinic. These are popping up all over the country in pharmacies, drugstores, and shopping centers. They're usually staffed by nurse practitioners or physician assistants that have been trained to treat common illnesses and complaints. They're usually fairly quick and inexpensive. However,  if you have serious medical issues or chronic medical problems, these are probably not your best option. ° °No Primary Care Doctor: °- Call [facility] at  832-8000 - they can help you locate a primary care doctor that  accepts your insurance, provides certain services, etc. °- Physician Referral Service- 1-800-533-3463 ° °Chronic Pain Problems: °Organization         Address  Phone   Notes  °[facility]  (336) 297-2271 Patients need to be referred by their primary care doctor.  ° °Medication Assistance: °Organization         Address  Phone   Notes  °[place] [facility] [address] °[place], [place] [address] (336) 641-8030 --Must be a resident of [place] °-- Must have NO insurance coverage whatsoever (no Medicaid/ Medicare, etc.) °-- The pt. MUST have a primary care doctor that directs their care regularly and follows them in the community °  °[facility]  (866) 331-1348   °[employer]  (888) 892-1162   ° °Agencies that provide inexpensive medical care: °Organization         Address  Phone   Notes  °[facility]  (336) 832-8035   °[facility]    (336) 832-7272   °[facility] [address] °[place], [place] [address] (336) 832-4777   °[facility] [address], °[place] (336) 271-4999   °[facility]    (336) 373-0678   °[facility]    (336) 272-1050   °[facility] ° [address], [place] Phone:  (336) 832-4444, Fax:  (336) 832-4440 Hours of Operation:  9 am - 6 pm, M-F.  Also accepts Medicaid/Medicare and self-pay.  °[facility] ° [address], [place] Phone: (336) 832-3150, Fax: (336) 832-3151. Hours of Operation:  8:30 am - 5:30 pm, M-F.  Also accepts Medicaid and self-pay.  °[facility] [place] [address], [place] Phone: (336) 878-6027   °[facility] [address], [place], [place] (336)723-1848, Ext. 123 Mondays & Thursdays: 7-9 AM.  First 15 patients are seen on a first come, first serve basis. °  ° °Medicaid-accepting [place] Providers: ° °Organization         Address  Phone   Notes  °[facility] [address],  (336) 641-2100   Also accepts self-pay patients.  °[facility] [address], [place] ° (336) 856-9996   °[facility] [address], [place] (336) 288-8857   °[facility] [address] [place] [address], [place] (336) 299-7000   °Veita Bland [address], [place]  ° (336) 373-1557 Only accepts [place] Access Medicaid patients after they have their name applied to their card.  ° °Self-Pay (no insurance) in [place]: ° °Organization         Address  Phone   Notes  °Sickle Cell Patients, [facility] [address], [place] (336) 832-1970   °[facility] [address], [place] (336) 832-4400   °[facility] ° [address], [place] (336) 992-4800   °[facility]/Dr. Osei-Bonsu ° [address] [place] [address], [place] or [address], [place] (336) 841-8500 Phone number for both [place] and [place] locations is the same.  °[facility] [address], [place] (336) 299-0000   °[facility] [address] [place] [address], [place] or [address] (336) 852-7530 °(336) 878-2260   °[facility] [address], [place] (336) 350-1642, phone; (336) 294-5005, fax Sees patients 1st and 3rd Saturday of every month.  Must not qualify for public or private insurance (i.e. Medicaid, Medicare, [place] Health Choice, Veterans' Benefits) • Household income should be no more than 200% of the poverty level •The clinic cannot treat you if you are pregnant or think you are  pregnant • Sexually transmitted diseases are not treated at the clinic.  ° ° °Dental Care: °Organization         Address  Phone  Notes  °[facility] [address], [place] (336) 641-6152 Accepts children up to age 21 who are enrolled in Medicaid or [place] Health Choice; pregnant women with a Medicaid card; and children who have applied for Medicaid or [place] Health Choice, but were declined, whose parents can pay a reduced fee at time of service.  °[facility] [place]  [address], [place] (336) 641-7733 Accepts children up to age 21 who are enrolled in Medicaid or [place] Health Choice; pregnant women with a Medicaid card; and children who have applied for Medicaid or [place] Health Choice, but were declined, whose parents can pay a reduced fee at time of service.  °[facility] ° [address],  (336) 641-4533 Patients are seen by appointment only. Walk-ins are not accepted. [facility] will see patients 18 years of age and older. °Monday - Tuesday (8am-5pm) °Most Wednesdays (8:30-5pm) °$30 per visit, cash only  °[facility] ° [address], [place] (336) 641-4533 Patients are seen by appointment only. Walk-ins are not accepted. [facility] will see patients 18 years of age and older. °One Wednesday Evening (Monthly: Volunteer Based).  $30 per visit, cash only  °[facility]  (919) 537-3737 for adults; Children under age 4, call Graduate Pediatric Dentistry at ([phone]) [phone]. Children aged 4-14, please call (919) [phone] to request a pediatric application. ° Dental services are provided in all areas of dental care including fillings, crowns and bridges, complete and partial dentures, implants, gum treatment, root canals, and extractions. Preventive care is also provided. Treatment is   provided to both adults and  children. °Patients are selected via a lottery and there is often a waiting list. °  °[facility] [address], °[place] ° (336) 763-8833 [email or web address] °  °[facility] [address], [place], [place] (336)723-1848, Ext. 123 Second and Fourth Thursday of each month, opens at 6:30 AM; Clinic ends at 9 AM.  Patients are seen on a first-come first-served basis, and a limited number are seen during each clinic.  ° °[facility] ° [address], [place], [place] (336) 723-7904   Eligibility Requirements °You must have lived in [place], [place], or [place] counties for at least the last three months. °  You cannot be eligible for state or federal sponsored healthcare insurance, including Veterans Administration, Medicaid, or Medicare. °  You generally cannot be eligible for healthcare insurance through your employer.  °  How to apply: °Eligibility screenings are held every Tuesday and Wednesday afternoon from 1:00 pm until 4:00 pm. You do not need an appointment for the interview!  °[facility] [address], [place], [place] 336-631-2330   °[facility]  336-342-8273   °[facility]  336-703-3100   °[facility]  336-570-6415   ° °Behavioral Health Resources in the Community: °Intensive Outpatient Programs °Organization         Address  Phone  Notes  °[facility] [address], [place], [place] 336-878-6098   °[facility] [address], [place], [place] 336-832-9800   °[facility] [address], [place], [place] ° 336-882-2125   °[facility] [address],  °[place], [place] 1-800-853-5163 or 336-641-4981   °Substance Abuse Resources °Organization         Address  Phone  Notes  °[facility]  336-882-2125   °[facility]  336-784-9470   °[facility] [place] [facility]  336-285-9073    °[facility]  336-845-3988   °Residential & Outpatient Substance Abuse Program  1-800-659-3381   °Psychological Services °Organization         Address  Phone  Notes  ° Health  336- 832-9600   °[facility]  336- 378-7881   °[facility] [address], [place] 1-800-853-5163 or 336-641-4981   ° °Mobile Crisis Teams °Organization         Address  Phone  Notes  °[facility], Mobile Crisis Care Unit  1-877-626-1772   °Assertive °[facility] ° [address] [place], [place] 336-834-9664   °Sharon DeEsch [address] °[place] 336-554-5454   ° °Self-Help/Support Groups °Organization         Address  Phone             Notes  °[facility]  - variety of support groups  336- 373-1402 Call for more information  °Narcotics Anonymous (NA), [facility] [address], °[place]  2 meetings at this location  ° °Residential Treatment Programs °Organization         Address  Phone  Notes  °[facility] [address],    ° [place]  1-866-801-8205   °[facility] ° [address], [place], [place] 704-293-8524   °[facility] [address], [place] 336-845-3988 Admissions: 8am-3pm M-F  °[facility] [address]    °[place],    336-841-1104   °[facility] [address], [place], [place] 336-379-7146   °[facility] [place] [facility] [address]  °[place], [place] 336-285-9073   °[facility] Intensive Outpatient [address], [place], [place] 336-852-3033   °[facility] [address]  °[place], [place] 1-877-615-2722 or 336-784-9470   °[facility] [address] [place], [place] 336-227-7417 Accepts Medicaid  °[facility] [address]  °[place] 1-800-659-3381 Substance Abuse/Addiction Treatment  ° °[place]  Behavioral Health Resources °Organization         Address  Phone  Notes  °[facility]  (888) 581-9988   °Julie Brannon, PhD [address] [place], [place]   (336) 349-5553 or (336) 951-0000   °[facility]   [address] °[place], [place] (336) 349-4454   °[facility] [address], [place], [place] (336) 342-8316 Insurance/Medicaid/sponsorship through [facility]  °[facility] [address]                                    [place], [place] (336) 342-8316 Therapy/tele-psych/case  °[facility] [address]  ° [place], [place] (336) 349-2233    °Dr. Arfeen  (336) 349-4544   °[facility] [place]  [employer] [place] [facility] 1) [address], [place] °2) [address], [place] °3)  [address], [place] (336) 349-3220 °(336) 342-7768 ° °(336) 342-8140   °[place] [facility] (336) 342-1394 or (336) 342-3537 (After Hours)    ° ° ° °

## 2014-05-15 NOTE — ED Notes (Signed)
MD at bedside. 

## 2014-05-15 NOTE — ED Notes (Signed)
Pt reports helping unload a truck at work tonight and now c/o back pain from the lower back extending into the neck.

## 2014-06-03 ENCOUNTER — Emergency Department (HOSPITAL_COMMUNITY)
Admission: EM | Admit: 2014-06-03 | Discharge: 2014-06-03 | Disposition: A | Discharge status: Home / Self Care | Payer: Self-pay | Attending: Emergency Medicine | Admitting: Emergency Medicine

## 2014-06-03 ENCOUNTER — Encounter (HOSPITAL_COMMUNITY): Payer: Self-pay | Admitting: Emergency Medicine

## 2014-06-03 DIAGNOSIS — X58XXXA Exposure to other specified factors, initial encounter: Secondary | ICD-10-CM | POA: Insufficient documentation

## 2014-06-03 DIAGNOSIS — K0889 Other specified disorders of teeth and supporting structures: Secondary | ICD-10-CM

## 2014-06-03 DIAGNOSIS — K089 Disorder of teeth and supporting structures, unspecified: Secondary | ICD-10-CM | POA: Insufficient documentation

## 2014-06-03 DIAGNOSIS — Y9389 Activity, other specified: Secondary | ICD-10-CM | POA: Insufficient documentation

## 2014-06-03 DIAGNOSIS — Z79899 Other long term (current) drug therapy: Secondary | ICD-10-CM | POA: Insufficient documentation

## 2014-06-03 DIAGNOSIS — Z792 Long term (current) use of antibiotics: Secondary | ICD-10-CM | POA: Insufficient documentation

## 2014-06-03 DIAGNOSIS — Z87891 Personal history of nicotine dependence: Secondary | ICD-10-CM | POA: Insufficient documentation

## 2014-06-03 DIAGNOSIS — Y929 Unspecified place or not applicable: Secondary | ICD-10-CM | POA: Insufficient documentation

## 2014-06-03 DIAGNOSIS — Z880 Allergy status to penicillin: Secondary | ICD-10-CM | POA: Insufficient documentation

## 2014-06-03 DIAGNOSIS — S025XXA Fracture of tooth (traumatic), initial encounter for closed fracture: Secondary | ICD-10-CM | POA: Insufficient documentation

## 2014-06-03 MED ORDER — OXYCODONE-ACETAMINOPHEN 5-325 MG PO TABS: 1.0000 | ORAL_TABLET | ORAL | Status: DC | PRN

## 2014-06-03 MED ORDER — OXYCODONE-ACETAMINOPHEN 5-325 MG PO TABS
2.0000 | ORAL_TABLET | Freq: Once | ORAL | Status: AC
  Administered 2014-06-03: 2 via ORAL
  Filled 2014-06-03: qty 2

## 2014-06-03 NOTE — ED Provider Notes (Signed)
CSN: [id number]     Arrival date & time 06/03/14  0138 History   First MD Initiated Contact with Patient 06/03/14 0250     Chief Complaint  Patient presents with  . Dental Pain     (Consider location/radiation/quality/duration/timing/severity/associated sxs/prior Treatment) Patient is a 38 y.o. male presenting with tooth pain. The history is provided by the patient.  Dental Pain He was eating a cookie when his right upper wisdom tooth broke off. He is complaining of pain and bleeding. Pain is rated at 9/10. Nothing makes it better, nothing makes it worse. He says he can still feel a piece of his tooth in the socket. He has a bottle of clindamycin at home, and took two capsules before coming to the ED.  Past Medical History  Diagnosis Date  . Back pain    Past Surgical History  Procedure Laterality Date  . Collarbone    . Orthopedic surgery     History reviewed. No pertinent family history. History  Substance Use Topics  . Smoking status: Former Smoker -- 0.50 packs/day    Types: Cigarettes    Quit date: 01/21/2013  . Smokeless tobacco: Not on file  . Alcohol Use: No    Review of Systems  All other systems reviewed and are negative.     Allergies  Penicillins; Bee venom; and Xylocaine dental  Home Medications   Prior to Admission medications   Medication Sig Start Date End Date Taking? Authorizing Provider  clindamycin (CLEOCIN) 150 MG capsule Take by mouth 3 (three) times daily.   Yes Historical Provider, MD  HYDROcodone-acetaminophen (NORCO/VICODIN) 5-325 MG per tablet Take one-two tabs po q 4-6 hrs prn pain 04/30/14   [name] L. Triplett, PA-C  oxyCODONE-acetaminophen (PERCOCET) 5-325 MG per tablet Take 1 tablet by mouth every 4 (four) hours as needed. 04/21/14   Flint MelterElliott L [place], MD   BP 121/78  Pulse 66  Temp(Src) 98 F (36.7 C) (Oral)  Resp 20  Ht 6' (1.829 m)  Wt 240 lb (108.863 kg)  BMI 32.54 kg/m2  SpO2 100% Physical Exam  Nursing note and vitals  reviewed.  38 year old male, resting comfortably and in no acute distress. Vital signs are normal. Oxygen saturation is 100%, which is normal. Head is normocephalic and atraumatic. PERRLA, EOMI. examination of the oral cavity shows very poor dentition with multiple teeth that are extremely carious and multiple missing teeth. Tooth #1 has been broken off at the gumline with some bright minimal ongoing oozing but no active bleeding. Neck is nontender and supple without adenopathy or JVD. Back is nontender and there is no CVA tenderness. Lungs are clear without rales, wheezes, or rhonchi. Chest is nontender. Heart has regular rate and rhythm without murmur. Abdomen is soft, flat, nontender without masses or hepatosplenomegaly and peristalsis is normoactive. Extremities have no cyanosis or edema, full range of motion is present. Skin is warm and dry without rash. Neurologic: Mental status is normal, cranial nerves are intact, there are no motor or sensory deficits.  ED Course  Procedures (including critical care time)  MDM   Final diagnoses:  Pain, dental    Fracture of tooth #1 with mild residual bleeding. Overall very poor dentition. Old records are reviewed and he has several other ED visits related to dental issues. He is given dental resources and is discharged with prescription for oxycodone acetaminophen for pain and is advised to take his clindamycin 3 times a day.    Dione Boozeavid Jessaca [place], MD 06/03/14 [phone]

## 2014-06-03 NOTE — ED Notes (Signed)
Pt reporting pain on upper right side due to broken tooth.

## 2014-06-03 NOTE — Discharge Instructions (Signed)
Take your Clindamycin three times a day.  Dental Pain A tooth ache may be caused by cavities (tooth decay). Cavities expose the nerve of the tooth to air and hot or cold temperatures. It may come from an infection or abscess (also called a boil or furuncle) around your tooth. It is also often caused by dental caries (tooth decay). This causes the pain you are having. DIAGNOSIS  Your caregiver can diagnose this problem by exam. TREATMENT   If caused by an infection, it may be treated with medications which kill germs (antibiotics) and pain medications as prescribed by your caregiver. Take medications as directed.  Only take over-the-counter or prescription medicines for pain, discomfort, or fever as directed by your caregiver.  Whether the tooth ache today is caused by infection or dental disease, you should see your dentist as soon as possible for further care. SEEK MEDICAL CARE IF: The exam and treatment you received today has been provided on an emergency basis only. This is not a substitute for complete medical or dental care. If your problem worsens or new problems (symptoms) appear, and you are unable to meet with your dentist, call or return to this location. SEEK IMMEDIATE MEDICAL CARE IF:   You have a fever.  You develop redness and swelling of your face, jaw, or neck.  You are unable to open your mouth.  You have severe pain uncontrolled by pain medicine. MAKE SURE YOU:   Understand these instructions.  Will watch your condition.  Will get help right away if you are not doing well or get worse. Document Released: 11/02/2005 Document Revised: 01/25/2012 Document Reviewed: 06/20/2008 [facility] Patient Information 2015 [place], [place]. This information is not intended to replace advice given to you by your health care provider. Make sure you discuss any questions you have with your health care provider.    Emergency Department Resource Guide   Dental Care: Organization          Address  Phone  Notes  [facility] Department of [facility] [address] [place], [place] [phone] Accepts children up to age 42 who are enrolled in [place] or [place] Health Choice; pregnant women with a Medicaid card; and children who have applied for Medicaid or [place] Health Choice, but were declined, whose parents can pay a reduced fee at time of service.  [facility] Department of [facility]  [address], [place] [phone] Accepts children up to age 22 who are enrolled in [place] or  Health Choice; pregnant women with a Medicaid card; and children who have applied for Medicaid or  Health Choice, but were declined, whose parents can pay a reduced fee at time of service.  [facility]  [address] [place], [place] [phone] Patients are seen by appointment only. Walk-ins are not accepted. [facility] will see patients 28 years of age and older. Monday - Tuesday (8am-5pm) Most Wednesdays (8:30-5pm) $30 per visit, cash only  [facility]  [address], [facility] [phone] Patients are seen by appointment only. Walk-ins are not accepted. [facility] will see patients 40 years of age and older. One Wednesday Evening (Monthly: Volunteer Based).  $30 per visit, cash only  [employer]  [phone] for adults; Children under age 63, call Graduate Pediatric Dentistry at [phone]. Children aged 52-14, please call [phone] to request a pediatric application.  Dental  services are provided in all areas of dental care including fillings, crowns and bridges, complete and partial dentures, implants, gum treatment, root canals, and extractions. Preventive care is also provided. Treatment is provided to both adults and children. Patients are selected via a lottery and there is often a waiting list.   [facility] [address], [place]  [phone] [email or web address]   [facility] [address], [place], [place] [phone], Ext. [phone] Second and Fourth Thursday of each month, opens at 6:30 AM; Clinic ends at 9 AM.  Patients are seen on a first-come first-served basis, and a limited number are seen during each clinic.   [facility]  [address], [place], [place] [phone]   Eligibility Requirements You must have lived in [place], [place], or [place] counties for at least the last three months.   You cannot be eligible for state or federal sponsored [place] insurance, including [employer], [place], or [employer]icare.   You generally cannot be eligible for healthcare insurance through your employer.    How to apply: Eligibility screenings are held every Tuesday and Wednesday afternoon from 1:00 pm until 4:00 pm. You do not need an appointment for the interview!  [facility] [address], [place], [place] [phone]   [facility]  [phone]   [facility]  [phone]   [facility]  [phone]

## 2014-06-04 MED FILL — Oxycodone w/ Acetaminophen Tab 5-325 MG: ORAL | Qty: 6 | Status: AC

## 2014-06-12 ENCOUNTER — Emergency Department (HOSPITAL_COMMUNITY)
Admission: EM | Admit: 2014-06-12 | Discharge: 2014-06-13 | Disposition: A | Discharge status: Home / Self Care | Payer: Self-pay | Attending: Emergency Medicine | Admitting: Emergency Medicine

## 2014-06-12 ENCOUNTER — Encounter (HOSPITAL_COMMUNITY): Payer: Self-pay | Admitting: Emergency Medicine

## 2014-06-12 ENCOUNTER — Emergency Department (HOSPITAL_COMMUNITY): Payer: Self-pay

## 2014-06-12 DIAGNOSIS — S63501A Unspecified sprain of right wrist, initial encounter: Secondary | ICD-10-CM

## 2014-06-12 DIAGNOSIS — S59909A Unspecified injury of unspecified elbow, initial encounter: Secondary | ICD-10-CM | POA: Insufficient documentation

## 2014-06-12 DIAGNOSIS — Z79899 Other long term (current) drug therapy: Secondary | ICD-10-CM | POA: Insufficient documentation

## 2014-06-12 DIAGNOSIS — X500XXA Overexertion from strenuous movement or load, initial encounter: Secondary | ICD-10-CM | POA: Insufficient documentation

## 2014-06-12 DIAGNOSIS — Z880 Allergy status to penicillin: Secondary | ICD-10-CM | POA: Insufficient documentation

## 2014-06-12 DIAGNOSIS — Z792 Long term (current) use of antibiotics: Secondary | ICD-10-CM | POA: Insufficient documentation

## 2014-06-12 DIAGNOSIS — Y9289 Other specified places as the place of occurrence of the external cause: Secondary | ICD-10-CM | POA: Insufficient documentation

## 2014-06-12 DIAGNOSIS — Y9389 Activity, other specified: Secondary | ICD-10-CM | POA: Insufficient documentation

## 2014-06-12 DIAGNOSIS — Z87891 Personal history of nicotine dependence: Secondary | ICD-10-CM | POA: Insufficient documentation

## 2014-06-12 DIAGNOSIS — Y990 Civilian activity done for income or pay: Secondary | ICD-10-CM | POA: Insufficient documentation

## 2014-06-12 DIAGNOSIS — S63509A Unspecified sprain of unspecified wrist, initial encounter: Secondary | ICD-10-CM | POA: Insufficient documentation

## 2014-06-12 DIAGNOSIS — S59919A Unspecified injury of unspecified forearm, initial encounter: Secondary | ICD-10-CM

## 2014-06-12 DIAGNOSIS — S6990XA Unspecified injury of unspecified wrist, hand and finger(s), initial encounter: Secondary | ICD-10-CM

## 2014-06-12 NOTE — ED Notes (Signed)
Patient c/o right wrist pain. Swollen area noted to right wrist. Patient has limited movement to wrist and fingers of right hand d/t pain. +radial pulse, capillary refill is brisk

## 2014-06-12 NOTE — ED Notes (Signed)
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

## 2014-06-13 MED ORDER — NAPROXEN 500 MG PO TABS: 500.0000 mg | ORAL_TABLET | Freq: Two times a day (BID) | ORAL | Status: DC

## 2014-06-13 MED ORDER — HYDROCODONE-ACETAMINOPHEN 5-325 MG PO TABS: 1.0000 | ORAL_TABLET | ORAL | Status: DC | PRN

## 2014-06-13 MED ORDER — HYDROCODONE-ACETAMINOPHEN 5-325 MG PO TABS
1.0000 | ORAL_TABLET | Freq: Once | ORAL | Status: AC
  Administered 2014-06-13: 1 via ORAL
  Filled 2014-06-13: qty 1

## 2014-06-13 NOTE — Discharge Instructions (Signed)
Do not take the narcotic pain medication if you are driving as it will make you sleepy. Follow up with Dr. Hilda LiasKeeling if symptoms persist.

## 2014-06-13 NOTE — ED Provider Notes (Signed)
CSN: [id number]     Arrival date & time 06/12/14  2335 History   First MD Initiated Contact with Patient 06/13/14 0000     Chief Complaint  Patient presents with  . Wrist Injury     (Consider location/radiation/quality/duration/timing/severity/associated sxs/prior Treatment) Patient is a 38 y.o. male presenting with wrist injury. The history is provided by the patient.  Wrist Injury Location:  Wrist Injury: yes   Mechanism of injury comment:  Working Wrist location:  R wrist Pain details:    Quality:  Throbbing and sharp   Radiates to:  Does not radiate   Onset quality:  Sudden   Timing:  Constant   Progression:  Unchanged Chronicity:  New Dislocation: no   Foreign body present:  No foreign bodies Prior injury to area:  No Worsened by:  Movement Ineffective treatments:  None tried  KEVIN SPACE is a 38 y.o. male who presents to the ED with right wrist pain that started tonight at work when he picked up a spool of yarn and felt a pop in his right wrist. He reports the pain is throbbing, sharpe and severe.   Past Medical History  Diagnosis Date  . Back pain    Past Surgical History  Procedure Laterality Date  . Collarbone    . Orthopedic surgery     No family history on file. History  Substance Use Topics  . Smoking status: Former Smoker -- 0.50 packs/day    Types: Cigarettes    Quit date: 01/21/2013  . Smokeless tobacco: Not on file  . Alcohol Use: No    Review of Systems Negative except as stated in HPI   Allergies  Penicillins; Bee venom; and Xylocaine dental  Home Medications   Prior to Admission medications   Medication Sig Start Date End Date Taking? Authorizing Provider  clindamycin (CLEOCIN) 150 MG capsule Take by mouth 3 (three) times daily.    Historical Provider, MD  HYDROcodone-acetaminophen (NORCO/VICODIN) 5-325 MG per tablet Take one-two tabs po q 4-6 hrs prn pain 04/30/14   [name] L. Triplett, PA-C  oxyCODONE-acetaminophen (PERCOCET)  5-325 MG per tablet Take 1 tablet by mouth every 4 (four) hours as needed. 04/21/14   Flint Melter, MD  oxyCODONE-acetaminophen (PERCOCET/ROXICET) 5-325 MG per tablet Take 1 tablet by mouth every 4 (four) hours as needed. 06/03/14   Dione Booze, MD  oxyCODONE-acetaminophen (PERCOCET/ROXICET) 5-325 MG per tablet Take 1 tablet by mouth every 4 (four) hours as needed. 06/03/14   Dione Booze, MD   BP 124/83  Pulse 108  Temp(Src) 98.6 F (37 C) (Oral)  Resp 18  Ht 6' (1.829 m)  Wt 243 lb (110.224 kg)  BMI 32.95 kg/m2  SpO2 96% Physical Exam  Nursing note and vitals reviewed. Constitutional: He is oriented to person, place, and time. He appears well-developed and well-nourished. No distress.  HENT:  Head: Normocephalic.  Eyes: EOM are normal.  Neck: Neck supple.  Cardiovascular: Normal rate.   Pulmonary/Chest: Effort normal.  Musculoskeletal:       Right hand: He exhibits tenderness and swelling. He exhibits normal range of motion, normal capillary refill, no deformity and no laceration. Normal sensation noted. Normal strength noted.  Radial pulse strong, adequate circulation, good touch sensation. Good strength. No tendon deficit.   Neurological: He is alert and oriented to person, place, and time. He has normal strength. No cranial nerve deficit or sensory deficit.  Skin: Skin is warm and dry.  Psychiatric: He has a normal mood and  affect. His behavior is normal.    ED Course  Procedures (including critical care time) Labs Review Labs Reviewed - No data to display  Imaging Review Dg Wrist Complete Right  06/13/2014   CLINICAL DATA:  Right wrist pain and swelling after injury tonight.  EXAM: RIGHT WRIST - COMPLETE 3+ VIEW  COMPARISON:  None.  FINDINGS: There is no evidence of fracture or dislocation. There is no evidence of arthropathy or other focal bone abnormality. Punctate radiopaque foreign bodies demonstrated in the soft tissues between the first and second metacarpal bones.   IMPRESSION: No acute bony abnormalities demonstrated.   Electronically Signed   By: Burman NievesWilliam  Stevens M.D.   On: 06/13/2014 00:11     MDM  38 y.o. male with left wrist injury while at work [employer]. Stable for discharge without neurovascular deficits. I have reviewed this patient's vital signs, nurses notes, appropriate imaging and discussed findings with the patient and plan of care. He voices understanding. Wrist splint, ice, elevation, pain management and follow up with ortho.    Medication List    TAKE these medications       naproxen 500 MG tablet  Commonly known as:  NAPROSYN  Take 1 tablet (500 mg total) by mouth 2 (two) times daily.      ASK your doctor about these medications       clindamycin 150 MG capsule  Commonly known as:  CLEOCIN  Take by mouth 3 (three) times daily.     HYDROcodone-acetaminophen 5-325 MG per tablet  Commonly known as:  NORCO/VICODIN  Take one-two tabs po q 4-6 hrs prn pain  Ask about: Which instructions should I use?     HYDROcodone-acetaminophen 5-325 MG per tablet  Commonly known as:  NORCO/VICODIN  Take 1 tablet by mouth every 4 (four) hours as needed.  Ask about: Which instructions should I use?     oxyCODONE-acetaminophen 5-325 MG per tablet  Commonly known as:  PERCOCET  Take 1 tablet by mouth every 4 (four) hours as needed.     oxyCODONE-acetaminophen 5-325 MG per tablet  Commonly known as:  PERCOCET/ROXICET  Take 1 tablet by mouth every 4 (four) hours as needed.     oxyCODONE-acetaminophen 5-325 MG per tablet  Commonly known as:  PERCOCET/ROXICET  Take 1 tablet by mouth every 4 (four) hours as needed.              [address] [place], [place] 06/13/14 [phone]

## 2014-06-13 NOTE — ED Provider Notes (Signed)
Medical screening examination/treatment/procedure(s) were performed by non-physician practitioner and as supervising physician I was immediately available for consultation/collaboration.   Dione Boozeavid Lakeisa Heninger, MD 06/13/14 [phone]

## 2014-06-13 NOTE — ED Notes (Signed)
Patient verbalizes understanding of discharge instructions, prescription medications, and follow up care with Ortho MD. Patient ambulatory out of department at this time escorted by family

## 2014-08-21 ENCOUNTER — Encounter (HOSPITAL_COMMUNITY): Payer: Self-pay | Admitting: Emergency Medicine

## 2014-08-21 ENCOUNTER — Emergency Department (HOSPITAL_COMMUNITY)
Admission: EM | Admit: 2014-08-21 | Discharge: 2014-08-21 | Disposition: A | Discharge status: Home / Self Care | Payer: Self-pay | Attending: Emergency Medicine | Admitting: Emergency Medicine

## 2014-08-21 DIAGNOSIS — Z880 Allergy status to penicillin: Secondary | ICD-10-CM | POA: Insufficient documentation

## 2014-08-21 DIAGNOSIS — M5431 Sciatica, right side: Secondary | ICD-10-CM | POA: Insufficient documentation

## 2014-08-21 DIAGNOSIS — Z720 Tobacco use: Secondary | ICD-10-CM | POA: Insufficient documentation

## 2014-08-21 MED ORDER — OXYCODONE-ACETAMINOPHEN 5-325 MG PO TABS
1.0000 | ORAL_TABLET | Freq: Once | ORAL | Status: AC
Start: 2014-08-21 — End: 2014-08-21
  Administered 2014-08-21: 1 via ORAL
  Filled 2014-08-21: qty 1

## 2014-08-21 MED ORDER — CYCLOBENZAPRINE HCL 10 MG PO TABS: 10.0000 mg | ORAL_TABLET | Freq: Three times a day (TID) | ORAL | Status: DC | PRN

## 2014-08-21 MED ORDER — OXYCODONE-ACETAMINOPHEN 5-325 MG PO TABS: 1.0000 | ORAL_TABLET | ORAL | Status: DC | PRN

## 2014-08-21 MED ORDER — CYCLOBENZAPRINE HCL 10 MG PO TABS
10.0000 mg | ORAL_TABLET | Freq: Once | ORAL | Status: AC
  Administered 2014-08-21: 10 mg via ORAL
  Filled 2014-08-21: qty 1

## 2014-08-21 MED ORDER — PREDNISONE 10 MG PO TABS: ORAL_TABLET | ORAL | Status: DC
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
## 2014-08-21 NOTE — Discharge Instructions (Signed)
Back Pain, Adult °Back pain is very common. The pain often gets better over time. The cause of back pain is usually not dangerous. Most people can learn to manage their back pain on their own.  °HOME CARE  °· Stay active. Start with short walks on flat ground if you can. Try to walk farther each day. °· Do not sit, drive, or stand in one place for more than 30 minutes. Do not stay in bed. °· Do not avoid exercise or work. Activity can help your back heal faster. °· Be careful when you bend or lift an object. Bend at your knees, keep the object close to you, and do not twist. °· Sleep on a firm mattress. Lie on your side, and bend your knees. If you lie on your back, put a pillow under your knees. °· Only take medicines as told by your doctor. °· Put ice on the injured area. °¨ Put ice in a plastic bag. °¨ Place a towel between your skin and the bag. °¨ Leave the ice on for 15-20 minutes, 03-04 times a day for the first 2 to 3 days. After that, you can switch between ice and heat packs. °· Ask your doctor about back exercises or massage. °· Avoid feeling anxious or stressed. Find good ways to deal with stress, such as exercise. °GET HELP RIGHT AWAY IF:  °· Your pain does not go away with rest or medicine. °· Your pain does not go away in 1 week. °· You have new problems. °· You do not feel well. °· The pain spreads into your legs. °· You cannot control when you poop (bowel movement) or pee (urinate). °· Your arms or legs feel weak or lose feeling (numbness). °· You feel sick to your stomach (nauseous) or throw up (vomit). °· You have belly (abdominal) pain. °· You feel like you may pass out (faint). °MAKE SURE YOU:  °· Understand these instructions. °· Will watch your condition. °· Will get help right away if you are not doing well or get worse. °Document Released: 04/20/2008 Document Revised: 01/25/2012 Document Reviewed: 03/06/2014 °Exit[employer]® Patient Information ©2015 [employer]. This information is not intended  to replace advice given to you by your health care provider. Make sure you discuss any questions you have with your health care provider. ° °

## 2014-08-21 NOTE — ED Notes (Signed)
Patient given discharge instruction, verbalized understand. Patient ambulatory out of the department to waiting area

## 2014-08-23 NOTE — ED Provider Notes (Signed)
CSN: [id number]     Arrival date & time 08/21/14  1332 History   First MD Initiated Contact with Patient 08/21/14 1350     Chief Complaint  Patient presents with  . Back Pain     (Consider location/radiation/quality/duration/timing/severity/associated sxs/prior Treatment) HPI  Marvin Schroeder is a 38 y.o. male who presents to the Emergency Department complaining of right sided low back pain for several hours.  He states that he has been doing some heavy lifting recently.  He reports similar symptoms in the past and was advised to f/u with an orthopedic surgeon, but states he did not have the finances to do that.  He states the pain radiates into the right lateral thigh.  He denies numbness or weakness of the leg, abd pain, incontinence of bowel or bladder or dysuria.     Past Medical History  Diagnosis Date  . Back pain    Past Surgical History  Procedure Laterality Date  . Collarbone    . Orthopedic surgery     No family history on file. History  Substance Use Topics  . Smoking status: Current Some Day Smoker -- 0.50 packs/day    Types: Cigarettes  . Smokeless tobacco: Not on file  . Alcohol Use: No    Review of Systems  Constitutional: Negative for fever.  Respiratory: Negative for shortness of breath.   Gastrointestinal: Negative for vomiting, abdominal pain and constipation.  Genitourinary: Negative for dysuria, hematuria, flank pain, decreased urine volume and difficulty urinating.       Low back pain  Musculoskeletal: Positive for back pain. Negative for joint swelling.  Skin: Negative for rash.  Neurological: Negative for weakness and numbness.  All other systems reviewed and are negative.     Allergies  Penicillins; Bee venom; and Xylocaine dental  Home Medications   Prior to Admission medications   Medication Sig Start Date End Date Taking? Authorizing Provider  cyclobenzaprine (FLEXERIL) 10 MG tablet Take 1 tablet (10 mg total) by mouth 3 (three)  times daily as needed. 08/21/14   Medard Decuir L. Madesyn Ast, PA-C  oxyCODONE-acetaminophen (PERCOCET/ROXICET) 5-325 MG per tablet Take 1 tablet by mouth every 4 (four) hours as needed. 08/21/14   Jentzen Minasyan L. Donesha Wallander, PA-C  predniSONE (DELTASONE) 10 MG tablet Take 6 tablets day one, 5 tablets day two, 4 tablets day three, 3 tablets day four, 2 tablets day five, then 1 tablet day six 08/21/14   Jamison Soward L. Aleda Madl, PA-C   BP 121/78  Pulse 96  Temp(Src) 99 F (37.2 C) (Oral)  Resp 15  Ht 6' (1.829 m)  Wt 232 lb (105.235 kg)  BMI 31.46 kg/m2  SpO2 99% Physical Exam  Nursing note and vitals reviewed. Constitutional: He is oriented to person, place, and time. He appears well-developed and well-nourished. No distress.  HENT:  Head: Normocephalic and atraumatic.  Neck: Normal range of motion. Neck supple.  Cardiovascular: Normal rate, regular rhythm, normal heart sounds and intact distal pulses.   No murmur heard. Pulmonary/Chest: Effort normal and breath sounds normal. No respiratory distress.  Abdominal: Soft. He exhibits no distension. There is no tenderness.  Musculoskeletal: He exhibits tenderness. He exhibits no edema.       Lumbar back: He exhibits tenderness and pain. He exhibits normal range of motion, no swelling, no deformity, no laceration and normal pulse.  ttp of the  Right lumbar paraspinal muscles.  No spinal tenderness.  DP pulses are brisk and symmetrical.  Distal sensation intact.  Hip Flexors/Extensors are intact.  Pt has 5/5 strength against resistance of bilateral lower extremities.     Neurological: He is alert and oriented to person, place, and time. He has normal strength. No sensory deficit. He exhibits normal muscle tone. Coordination and gait normal.  Reflex Scores:      Patellar reflexes are 2+ on the right side and 2+ on the left side.      Achilles reflexes are 2+ on the right side and 2+ on the left side. Skin: Skin is warm and dry. No rash noted.    ED Course  Procedures  (including critical care time) Labs Review Labs Reviewed - No data to display  Imaging Review No results found.   EKG Interpretation None      MDM   Final diagnoses:  Sciatica, right    Pt ambulates with a steady gait.  No focal neuro deficits.  No  Concerning sx's for emergent neurological or infectious process.   Likely sciatica.  Pt agrees to close f/u and appears stable for d/c    Mendell Bontempo L. Trisha Mangleriplett, PA-C 08/23/14 1754

## 2014-08-25 NOTE — ED Provider Notes (Signed)
Medical screening examination/treatment/procedure(s) were performed by non-physician practitioner and as supervising physician I was immediately available for consultation/collaboration.   EKG Interpretation None       Yola Paradiso, MD 08/25/14 1057 

## 2014-09-08 ENCOUNTER — Encounter (HOSPITAL_COMMUNITY): Payer: Self-pay | Admitting: Emergency Medicine

## 2014-09-08 ENCOUNTER — Emergency Department (HOSPITAL_COMMUNITY)
Admission: EM | Admit: 2014-09-08 | Discharge: 2014-09-08 | Disposition: A | Discharge status: Home / Self Care | Payer: No Typology Code available for payment source | Attending: Emergency Medicine | Admitting: Emergency Medicine

## 2014-09-08 ENCOUNTER — Emergency Department (HOSPITAL_COMMUNITY): Payer: No Typology Code available for payment source

## 2014-09-08 DIAGNOSIS — Y92410 Unspecified street and highway as the place of occurrence of the external cause: Secondary | ICD-10-CM | POA: Insufficient documentation

## 2014-09-08 DIAGNOSIS — M25562 Pain in left knee: Secondary | ICD-10-CM

## 2014-09-08 DIAGNOSIS — Z880 Allergy status to penicillin: Secondary | ICD-10-CM | POA: Diagnosis not present

## 2014-09-08 DIAGNOSIS — Z79899 Other long term (current) drug therapy: Secondary | ICD-10-CM | POA: Diagnosis not present

## 2014-09-08 DIAGNOSIS — M546 Pain in thoracic spine: Secondary | ICD-10-CM

## 2014-09-08 DIAGNOSIS — S3992XA Unspecified injury of lower back, initial encounter: Secondary | ICD-10-CM | POA: Diagnosis not present

## 2014-09-08 DIAGNOSIS — Z720 Tobacco use: Secondary | ICD-10-CM | POA: Diagnosis not present

## 2014-09-08 DIAGNOSIS — Y9389 Activity, other specified: Secondary | ICD-10-CM | POA: Diagnosis not present

## 2014-09-08 DIAGNOSIS — Z791 Long term (current) use of non-steroidal anti-inflammatories (NSAID): Secondary | ICD-10-CM | POA: Diagnosis not present

## 2014-09-08 DIAGNOSIS — M545 Low back pain: Secondary | ICD-10-CM

## 2014-09-08 MED ORDER — NAPROXEN 500 MG PO TABS: 500.0000 mg | ORAL_TABLET | Freq: Two times a day (BID) | ORAL | Status: DC

## 2014-09-08 MED ORDER — CYCLOBENZAPRINE HCL 10 MG PO TABS: 10.0000 mg | ORAL_TABLET | Freq: Two times a day (BID) | ORAL | Status: DC | PRN

## 2014-09-08 MED ORDER — OXYCODONE-ACETAMINOPHEN 5-325 MG PO TABS: 2.0000 | ORAL_TABLET | ORAL | Status: DC | PRN

## 2014-09-08 MED ORDER — KETOROLAC TROMETHAMINE 60 MG/2ML IM SOLN
60.0000 mg | Freq: Once | INTRAMUSCULAR | Status: AC
  Administered 2014-09-08: 60 mg via INTRAMUSCULAR
  Filled 2014-09-08: qty 2

## 2014-09-08 MED ORDER — OXYCODONE-ACETAMINOPHEN 5-325 MG PO TABS
2.0000 | ORAL_TABLET | Freq: Once | ORAL | Status: AC
  Administered 2014-09-08: 2 via ORAL
  Filled 2014-09-08: qty 2

## 2014-09-08 NOTE — Discharge Instructions (Signed)
X-ray show no fracture. You will be sore for several days. Ice pack to painful areas. Medication for pain and muscle relaxer

## 2014-09-08 NOTE — ED Notes (Signed)
Pt lying flat in bed. C-collar had been removed prior to x-rays by Dr. Adriana Simasook. Nad noted.
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
## 2014-09-10 ENCOUNTER — Encounter (HOSPITAL_COMMUNITY): Payer: Self-pay | Admitting: Emergency Medicine

## 2014-09-10 ENCOUNTER — Emergency Department (HOSPITAL_COMMUNITY)
Admission: EM | Admit: 2014-09-10 | Discharge: 2014-09-10 | Disposition: A | Discharge status: Home / Self Care | Payer: No Typology Code available for payment source | Attending: Emergency Medicine | Admitting: Emergency Medicine

## 2014-09-10 DIAGNOSIS — M545 Low back pain: Secondary | ICD-10-CM | POA: Diagnosis present

## 2014-09-10 DIAGNOSIS — Z880 Allergy status to penicillin: Secondary | ICD-10-CM | POA: Diagnosis not present

## 2014-09-10 DIAGNOSIS — S39012D Strain of muscle, fascia and tendon of lower back, subsequent encounter: Secondary | ICD-10-CM | POA: Insufficient documentation

## 2014-09-10 DIAGNOSIS — Z720 Tobacco use: Secondary | ICD-10-CM | POA: Diagnosis not present

## 2014-09-10 DIAGNOSIS — Z79899 Other long term (current) drug therapy: Secondary | ICD-10-CM | POA: Diagnosis not present

## 2014-09-10 MED ORDER — OXYCODONE-ACETAMINOPHEN 5-325 MG PO TABS
2.0000 | ORAL_TABLET | Freq: Once | ORAL | Status: AC
  Administered 2014-09-10: 2 via ORAL
  Filled 2014-09-10: qty 2

## 2014-09-10 MED ORDER — NAPROXEN 500 MG PO TABS: 500.0000 mg | ORAL_TABLET | Freq: Two times a day (BID) | ORAL | Status: DC

## 2014-09-10 MED ORDER — METHOCARBAMOL 500 MG PO TABS: 500.0000 mg | ORAL_TABLET | Freq: Two times a day (BID) | ORAL | Status: DC | PRN
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
## 2014-09-10 NOTE — ED Notes (Signed)
Patient with no complaints at this time. Respirations even and unlabored. Skin warm/dry. Discharge instructions reviewed with patient at this time. Patient given opportunity to voice concerns/ask questions. Patient discharged at this time and left Emergency Department with steady gait.   

## 2014-09-10 NOTE — Discharge Instructions (Signed)
Back Pain: ° ° °Your back pain should be treated with medicines such as ibuprofen or aleve and this back pain should get better over the next 2 weeks.  However if you develop severe or worsening pain, low back pain with fever, numbness, weakness or inability to walk or urinate, you should return to the ER immediately.  Please follow up with your doctor this week for a recheck if still having symptoms. °Low back pain is discomfort in the lower back that may be due to injuries to muscles and ligaments around the spine.  Occasionally, it may be caused by a a problem to a part of the spine called a disc.  The pain may last several days or a week;  However, most patients get completely well in 4 weeks. ° °Self - care:  The application of heat can help soothe the pain.  Maintaining your daily activities, including walking, is encourged, as it will help you get better faster than just staying in bed. ° °Medications are also useful to help with pain control.  A commonly prescribed medications includes acetaminophen.  This medication is generally safe, though you should not take more than 8 of the extra strength (500mg) pills a day. ° °Non steroidal anti inflammatory medications including Ibuprofen and naproxen;  These medications help both pain and swelling and are very useful in treating back pain.  They should be taken with food, as they can cause stomach upset, and more seriously, stomach bleeding.   ° °Muscle relaxants:  These medications can help with muscle tightness that is a cause of lower back pain.  Most of these medications can cause drowsiness, and it is not safe to drive or use dangerous machinery while taking them. ° °You will need to follow up with  Your primary healthcare provider in 1-2 weeks for reassessment. ° °Be aware that if you develop new symptoms, such as a fever, leg weakness, difficulty with or loss of control of your urine or bowels, abdominal pain, or more severe pain, you will need to seek  medical attention and  / or return to the Emergency department. ° °If you do not have a doctor see the list below. ° °[facility] Doctor List ° ° ° °Edward Hawkins MD. Specialty: Pulmonary Disease Contact information: [address]  °[address]  °[place] [address]  °[phone]  ° °Margaret Simpson, MD. Specialty: Family Medicine Contact information: [address]  °[place] [address]  °[phone]  ° °Scott Luking, MD. Specialty: Family Medicine Contact information: [address]  °[address]  °[place] [address]  °[phone]  ° °Tesfaye Fanta, MD Specialty: Internal Medicine Contact information: [address]  °[place] [address]  °[phone]  ° °Zach Hall, MD. Specialty: Internal Medicine Contact information: [address]  °[place] [address]  °[phone]  ° °Angus Mcinnis, MD. Specialty: Family Medicine Contact information: [address]  °[place] [address]  °[phone]  ° °Stephen Knowlton, MD. Specialty: Family Medicine Contact information: [address]  °[address]  °[place] [address]  °[phone]  ° °Roy Fagan, MD. Specialty: Internal Medicine Contact information: [address]  °[address]  °[place] [address]  °[phone]  ° ° ° °
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
## 2015-08-08 ENCOUNTER — Emergency Department (HOSPITAL_COMMUNITY)
Admission: EM | Admit: 2015-08-08 | Discharge: 2015-08-09 | Disposition: A | Discharge status: Home / Self Care | Payer: Self-pay | Attending: Emergency Medicine | Admitting: Emergency Medicine

## 2015-08-08 DIAGNOSIS — M545 Low back pain: Secondary | ICD-10-CM | POA: Insufficient documentation

## 2015-08-08 DIAGNOSIS — Z720 Tobacco use: Secondary | ICD-10-CM | POA: Insufficient documentation

## 2015-08-08 DIAGNOSIS — Z880 Allergy status to penicillin: Secondary | ICD-10-CM | POA: Insufficient documentation

## 2015-08-08 DIAGNOSIS — Z79899 Other long term (current) drug therapy: Secondary | ICD-10-CM | POA: Insufficient documentation

## 2015-08-08 DIAGNOSIS — Z791 Long term (current) use of non-steroidal anti-inflammatories (NSAID): Secondary | ICD-10-CM | POA: Insufficient documentation

## 2015-08-09 ENCOUNTER — Encounter (HOSPITAL_COMMUNITY): Payer: Self-pay | Admitting: *Deleted

## 2015-08-09 MED ORDER — IBUPROFEN 400 MG PO TABS
ORAL_TABLET | ORAL | Status: AC
  Filled 2015-08-09: qty 2

## 2015-08-09 MED ORDER — METHOCARBAMOL 500 MG PO TABS: 500.0000 mg | ORAL_TABLET | Freq: Three times a day (TID) | ORAL | Status: DC | PRN

## 2015-08-09 MED ORDER — OXYCODONE-ACETAMINOPHEN 5-325 MG PO TABS
ORAL_TABLET | ORAL | Status: AC
  Filled 2015-08-09: qty 2

## 2015-08-09 MED ORDER — IBUPROFEN 400 MG PO TABS
600.0000 mg | ORAL_TABLET | Freq: Once | ORAL | Status: AC
  Administered 2015-08-09: 600 mg via ORAL

## 2015-08-09 MED ORDER — OXYCODONE-ACETAMINOPHEN 5-325 MG PO TABS
2.0000 | ORAL_TABLET | Freq: Once | ORAL | Status: AC
  Administered 2015-08-09: 2 via ORAL

## 2015-08-09 MED ORDER — OXYCODONE-ACETAMINOPHEN 5-325 MG PO TABS: 1.0000 | ORAL_TABLET | ORAL | Status: DC | PRN

## 2015-08-09 NOTE — ED Notes (Signed)
Pt reporting pain in lower back, worse past two days.

## 2015-08-09 NOTE — Discharge Instructions (Signed)
Back Pain, Adult °Back pain is very common. The pain often gets better over time. The cause of back pain is usually not dangerous. Most people can learn to manage their back pain on their own.  °HOME CARE  °· Stay active. Start with short walks on flat ground if you can. Try to walk farther each day. °· Do not sit, drive, or stand in one place for more than 30 minutes. Do not stay in bed. °· Do not avoid exercise or work. Activity can help your back heal faster. °· Be careful when you bend or lift an object. Bend at your knees, keep the object close to you, and do not twist. °· Sleep on a firm mattress. Lie on your side, and bend your knees. If you lie on your back, put a pillow under your knees. °· Only take medicines as told by your doctor. °· Put ice on the injured area. °¨ Put ice in a plastic bag. °¨ Place a towel between your skin and the bag. °¨ Leave the ice on for 15-20 minutes, 03-04 times a day for the first 2 to 3 days. After that, you can switch between ice and heat packs. °· Ask your doctor about back exercises or massage. °· Avoid feeling anxious or stressed. Find good ways to deal with stress, such as exercise. °GET HELP RIGHT AWAY IF:  °· Your pain does not go away with rest or medicine. °· Your pain does not go away in 1 week. °· You have new problems. °· You do not feel well. °· The pain spreads into your legs. °· You cannot control when you poop (bowel movement) or pee (urinate). °· Your arms or legs feel weak or lose feeling (numbness). °· You feel sick to your stomach (nauseous) or throw up (vomit). °· You have belly (abdominal) pain. °· You feel like you may pass out (faint). °MAKE SURE YOU:  °· Understand these instructions. °· Will watch your condition. °· Will get help right away if you are not doing well or get worse. °Document Released: 04/20/2008 Document Revised: 01/25/2012 Document Reviewed: 03/06/2014 °ExitCare® Patient Information ©2015 [employer]. This information is not intended  to replace advice given to you by your health care provider. Make sure you discuss any questions you have with your health care provider. ° ° °Emergency Department Resource Guide °1) Find a Doctor and Pay Out of Pocket °Although you won't have to find out who is covered by your insurance plan, it is a good idea to ask around and get recommendations. You will then need to call the office and see if the doctor you have chosen will accept you as a new patient and what types of options they offer for patients who are self-pay. Some doctors offer discounts or will set up payment plans for their patients who do not have insurance, but you will need to ask so you aren't surprised when you get to your appointment. ° °2) Contact Your Local Health Department °Not all health departments have doctors that can see patients for sick visits, but many do, so it is worth a call to see if yours does. If you don't know where your local health department is, you can check in your phone book. The CDC also has a tool to help you locate your state's health department, and many state websites also have listings of all of their local health departments. ° °3) Find a Walk-in Clinic °If your illness is not likely to be   very severe or complicated, you may want to try a walk in clinic. These are popping up all over the country in pharmacies, drugstores, and shopping centers. They're usually staffed by nurse practitioners or physician assistants that have been trained to treat common illnesses and complaints. They're usually fairly quick and inexpensive. However, if you have serious medical issues or chronic medical problems, these are probably not your best option. ° °No Primary Care Doctor: °- Call [facility] at  832-8000 - they can help you locate a primary care doctor that  accepts your insurance, provides certain services, etc. °- Physician Referral Service- 1-800-533-3463 ° °Chronic Pain Problems: °Organization         Address  Phone    Notes  °[facility]  (336) 297-2271 Patients need to be referred by their primary care doctor.  ° °Medication Assistance: °Organization         Address  Phone   Notes  °[place] [facility] [address] °[place], [place] [address] (336) 641-8030 --Must be a resident of [place] °-- Must have NO insurance coverage whatsoever (no Medicaid/ Medicare, etc.) °-- The pt. MUST have a primary care doctor that directs their care regularly and follows them in the community °  °[facility]  (866) 331-1348   °[employer]  (888) 892-1162   ° °Agencies that provide inexpensive medical care: °Organization         Address  Phone   Notes  °[facility]  (336) 832-8035   °[facility]    (336) 832-7272   °[facility] [address] °[place], [place] [address] (336) 832-4777   °[facility] [address], °[place] (336) 271-4999   °[facility]    (336) 373-0678   °[facility]    (336) 272-1050   °[facility] ° [address], [place] Phone:  (336) 832-4444, Fax:  (336) 832-4440 Hours of Operation:  9 am - 6 pm, M-F.  Also accepts Medicaid/Medicare and self-pay.  °[facility] ° [address], [place] Phone: (336) 832-3150, Fax: (336) 832-3151. Hours of Operation:  8:30 am - 5:30 pm, M-F.  Also accepts Medicaid and self-pay.  °[facility] [place] [address], [place] Phone: (336) 878-6027   °[facility] [address], [place], [place] (336)723-1848, Ext. 123 Mondays & Thursdays: 7-9 AM.  First 15 patients are seen on a first come, first serve basis. °  ° °Medicaid-accepting [place] Providers: ° °Organization         Address  Phone   Notes  °[facility] [address], [place] (336) 641-2100 Also accepts self-pay patients.  °[facility]  [address], [place] ° (336) 856-9996   °[facility] [address], [place] (336) 288-8857   °[facility] [address] [place] [address], [place] (336) 299-7000   °Veita Bland [address], [place]  ° (336) 373-1557 Only accepts [place] Access Medicaid patients after they have their name applied to their card.  ° °Self-Pay (no insurance) in [place]: ° °Organization         Address  Phone   Notes  °Sickle Cell Patients, [facility] [address], [place] (336) 832-1970   °[facility]   [address], [place] (336) 832-4400   °[facility] ° [address], [place] (336) 992-4800   °[facility]/Dr. Osei-Bonsu ° [address] [place] [address], [place] or [address], [place] (336) 841-8500 Phone number for both [place] and [place] locations is the same.  °[facility] [address], [place] (336) 299-0000   °[facility] [address] [place] [address], [place] or [address] (336) 852-7530 °(336) 878-2260   °[facility] [address], [place] (336) 350-1642, phone; (336) 294-5005, fax Sees patients 1st and 3rd Saturday of every month.  Must not qualify for public or private insurance (i.e. Medicaid, Medicare, [place] Health Choice, Veterans' Benefits) • Household income should be no more than 200% of the poverty level •The clinic cannot treat you if you are pregnant or think you are pregnant • Sexually transmitted diseases are not treated at the clinic.  ° ° °Dental Care: °Organization         Address  Phone  Notes  °[facility] [facility] [address], [place] (336) 641-6152 Accepts children up to age 21 who are enrolled in Medicaid or [place] Health Choice; pregnant women with a Medicaid card; and children who have  applied for Medicaid or [place] Health Choice, but were declined, whose parents can pay a reduced fee at time of service.  °[facility] [place]  [address], [place] (336) 641-7733 Accepts children up to age 21 who are enrolled in Medicaid or [place] Health Choice; pregnant women with a Medicaid card; and children who have applied for Medicaid or [place] Health Choice, but were declined, whose parents can pay a reduced fee at time of service.  °[facility] ° [address], [place] (336) 641-4533 Patients are seen by appointment only. Walk-ins are not accepted. [facility] will see patients 18 years of age and older. °Monday - Tuesday (8am-5pm) °Most Wednesdays (8:30-5pm) °$30 per visit, cash only  °[facility] ° [address], [place] (336) 641-4533 Patients are seen by appointment only. Walk-ins are not accepted. [facility] will see patients 18 years of age and older. °One Wednesday Evening (Monthly: Volunteer Based).  $30 per visit, cash only  °[facility]  (919) 537-3737 for adults; Children under age 4, call Graduate Pediatric Dentistry at (919) [phone]. Children aged 4-14, please call (919) [phone] to request a pediatric application. ° Dental services are provided in all areas of dental care including fillings, crowns and bridges, complete and partial dentures, implants, gum treatment, root canals, and extractions. Preventive care is also provided. Treatment is provided to both adults and children. °Patients are selected via a lottery and there is often a waiting list. °  °[facility] [address], ° ° (336) 763-8833 [email or web address] °  °[facility] [address], [place], [place] (336)723-1848, Ext. 123 Second and Fourth Thursday of each month, opens at 6:30 AM; Clinic ends at 9 AM.  Patients are seen on a first-come first-served basis, and a  limited number are seen during each clinic.  ° °[facility] ° [address], [place],  (336) 723-7904   Eligibility Requirements °You must have lived in [place], [place], or [place] counties for at least the last three months. °    You cannot be eligible for state or federal sponsored healthcare insurance, including Veterans Administration, Medicaid, or Medicare. °  You generally cannot be eligible for healthcare insurance through your employer.  °  How to apply: °Eligibility screenings are held every Tuesday and Wednesday afternoon from 1:00 pm until 4:00 pm. You do not need an appointment for the interview!  °[facility] [address], [place], [place] 336-631-2330   °[facility]  336-342-8273   °[facility]  336-703-3100   °[facility]  336-570-6415   ° °Behavioral Health Resources in the Community: °Intensive Outpatient Programs °Organization         Address  Phone  Notes  °[facility] [address], [place], [place] 336-878-6098   °[facility] [address], [place], [place] 336-832-9800   °[facility] [address], [place], [place] ° 336-882-2125   °[facility] [address],  °[place], [place] 1-800-853-5163 or 336-641-4981   °Substance Abuse Resources °Organization         Address  Phone  Notes  °[facility]  336-882-2125   °[facility]  336-784-9470   °[facility]  336-285-9073   °[facility]  336-845-3988   °Residential & Outpatient Substance Abuse Program  1-800-659-3381   °Psychological Services °Organization         Address  Phone  Notes  °[facility]  336- 832-9600   °[facility]  336- 378-7881   °[facility] [address], [place] 1-800-853-5163 or 336-641-4981   ° °Mobile Crisis Teams °Organization          Address  Phone  Notes  °[facility], Mobile Crisis Care Unit  1-877-626-1772   °Assertive °[facility] ° [address] [place], [place] 336-834-9664   °Sharon DeEsch [address] °[place] 336-554-5454   ° °Self-Help/Support Groups °Organization         Address  Phone             Notes  °[facility] [place] - variety of support groups  336- 373-1402 Call for more information  °Narcotics Anonymous (NA), [facility] [address], °[place]  2 meetings at this location  ° °Residential Treatment Programs °Organization         Address  Phone  Notes  °[facility] [address],    °[place]  1-866-801-8205   °[facility] ° [address], [place], [place] 704-293-8524   °[facility] [address], [place] 336-845-3988 Admissions: 8am-3pm M-F  °[facility] [address]    °[place], [place] 336-841-1104   °[facility] [address], [place], [place] 336-379-7146   °[facility] [address]  °[place], [place] 336-285-9073   °[facility] - Intensive Outpatient [address], [place], [place] 336-852-3033   °[facility] [address]  °[place], [place] 1-877-615-2722 or 336-784-9470   °[facility] [address] , [place] 336-227-7417 Accepts Medicaid  °[facility] [address]  ° [place] 1-800-659-3381 Substance Abuse/Addiction Treatment  ° °[place] Behavioral Health Resources °Organization         Address  Phone  Notes  °[facility]  (888) 581-9988   °Julie Brannon, PhD [address]   [address] [place], [place]   (336) 349-5553 or (336) 951-0000   °[facility]   [address] °[place], [place] (336) 349-4454   °[facility] [address], [place], [place] (336) 342-8316 Insurance/Medicaid/sponsorship  through [facility]  °[facility] [address]                                    [place], [place] (336) 342-8316 Therapy/tele-psych/case  °[facility] [address]  ° [place], [place] (336) 349-2233    °Dr. Arfeen  (336) 349-4544   °[facility] [place]  [employer] [place] [facility] 1) [address], [place] °2) [address], [place] °3)  [address], [place] (336) 349-3220 °(336) 342-7768 ° °(336) 342-8140   °[place] [facility] (336) 342-1394 or (336) 342-3537 (After Hours)    ° ° ° ° °

## 2015-08-11 ENCOUNTER — Emergency Department (HOSPITAL_COMMUNITY)
Admission: EM | Admit: 2015-08-11 | Discharge: 2015-08-11 | Disposition: A | Discharge status: Home / Self Care | Payer: Self-pay | Attending: Emergency Medicine | Admitting: Emergency Medicine

## 2015-08-11 ENCOUNTER — Encounter (HOSPITAL_COMMUNITY): Payer: Self-pay | Admitting: *Deleted

## 2015-08-11 DIAGNOSIS — Z79899 Other long term (current) drug therapy: Secondary | ICD-10-CM | POA: Insufficient documentation

## 2015-08-11 DIAGNOSIS — Z720 Tobacco use: Secondary | ICD-10-CM | POA: Insufficient documentation

## 2015-08-11 DIAGNOSIS — Z880 Allergy status to penicillin: Secondary | ICD-10-CM | POA: Insufficient documentation

## 2015-08-11 DIAGNOSIS — M545 Low back pain, unspecified: Secondary | ICD-10-CM

## 2015-08-11 DIAGNOSIS — G8929 Other chronic pain: Secondary | ICD-10-CM | POA: Insufficient documentation

## 2015-08-11 DIAGNOSIS — Z791 Long term (current) use of non-steroidal anti-inflammatories (NSAID): Secondary | ICD-10-CM | POA: Insufficient documentation

## 2015-08-11 DIAGNOSIS — M5136 Other intervertebral disc degeneration, lumbar region: Secondary | ICD-10-CM | POA: Insufficient documentation

## 2015-08-11 MED ORDER — METHOCARBAMOL 500 MG PO TABS
1000.0000 mg | ORAL_TABLET | Freq: Once | ORAL | Status: AC
  Administered 2015-08-11: 1000 mg via ORAL
  Filled 2015-08-11: qty 2

## 2015-08-11 MED ORDER — DEXAMETHASONE SODIUM PHOSPHATE 4 MG/ML IJ SOLN
8.0000 mg | Freq: Once | INTRAMUSCULAR | Status: AC
  Administered 2015-08-11: 8 mg via INTRAMUSCULAR
  Filled 2015-08-11: qty 2

## 2015-08-11 MED ORDER — HYDROCODONE-ACETAMINOPHEN 5-325 MG PO TABS
2.0000 | ORAL_TABLET | Freq: Once | ORAL | Status: AC
  Administered 2015-08-11: 2 via ORAL
  Filled 2015-08-11: qty 2
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
## 2015-08-11 NOTE — ED Notes (Signed)
PA at bedside for evaluation
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
## 2015-08-17 NOTE — ED Provider Notes (Signed)
CSN: [id number]     Arrival date & time 08/08/15  2351 History   First MD Initiated Contact with Patient 08/09/15 0157     Chief Complaint  Patient presents with  . Back Pain     (Consider location/radiation/quality/duration/timing/severity/associated sxs/prior Treatment) HPI   39 year old male with lower back pain. Has been ongoing issue intermittently for the past couple years. Significant worse in the past 2 days. Denies any trauma her significant strain. Pain is worse with movement. Sometimes radiates into the left lower extremity. No numbness or tingling. Pain is worse with movement. No urinary complaints. No fevers or chills. Denies IVDU.   Past Medical History  Diagnosis Date  . Back pain    Past Surgical History  Procedure Laterality Date  . Collarbone    . Orthopedic surgery     History reviewed. No pertinent family history. Social History  Substance Use Topics  . Smoking status: Current Some Day Smoker -- 0.50 packs/day    Types: Cigarettes  . Smokeless tobacco: None  . Alcohol Use: No    Review of Systems  All systems reviewed and negative, other than as noted in HPI.   Allergies  Penicillins; Bee venom; and Xylocaine dental  Home Medications   Prior to Admission medications   Medication Sig Start Date End Date Taking? Authorizing Provider  cyclobenzaprine (FLEXERIL) 10 MG tablet Take 10 mg by mouth 3 (three) times daily as needed for muscle spasms. 08/21/14   [name] Triplett, PA-C  cyclobenzaprine (FLEXERIL) 10 MG tablet Take 1 tablet (10 mg total) by mouth 2 (two) times daily as needed for muscle spasms. 09/08/14   Donnetta Hutching, MD  ibuprofen (ADVIL,MOTRIN) 200 MG tablet Take 200 mg by mouth every 6 (six) hours as needed for headache.    Historical Provider, MD  methocarbamol (ROBAXIN) 500 MG tablet Take 1 tablet (500 mg total) by mouth 2 (two) times daily as needed for muscle spasms. 09/10/14   Eber Hong, MD  methocarbamol (ROBAXIN) 500 MG tablet Take 1  tablet (500 mg total) by mouth every 8 (eight) hours as needed for muscle spasms. 08/09/15   Raeford Razor, MD  naproxen (NAPROSYN) 500 MG tablet Take 1 tablet (500 mg total) by mouth 2 (two) times daily. 09/08/14   Donnetta Hutching, MD  naproxen (NAPROSYN) 500 MG tablet Take 1 tablet (500 mg total) by mouth 2 (two) times daily with a meal. 09/10/14   Eber Hong, MD  oxyCODONE-acetaminophen (PERCOCET) 5-325 MG per tablet Take 2 tablets by mouth every 4 (four) hours as needed. 09/08/14   Donnetta Hutching, MD  oxyCODONE-acetaminophen (PERCOCET/ROXICET) 5-325 MG per tablet Take 1 tablet by mouth every 4 (four) hours as needed for moderate pain. 08/21/14   [name] Triplett, PA-C  oxyCODONE-acetaminophen (PERCOCET/ROXICET) 5-325 MG per tablet Take 1-2 tablets by mouth every 4 (four) hours as needed. 08/09/15   Raeford Razor, MD  Pediatric Multiple Vit-C-FA (PEDIATRIC MULTIVITAMIN) chewable tablet Chew 2 tablets by mouth daily.    Historical Provider, MD  tetrahydrozoline 0.05 % ophthalmic solution Place 1 drop into both eyes daily as needed (Eye Allergies).    Historical Provider, MD   BP 141/75 mmHg  Pulse 92  Temp(Src) 99.2 F (37.3 C) (Oral)  Resp 20  Ht 6' (1.829 m)  Wt 215 lb (97.523 kg)  BMI 29.15 kg/m2  SpO2 97% Physical Exam  Constitutional: He appears well-developed and well-nourished. No distress.  HENT:  Head: Normocephalic and atraumatic.  Eyes: Conjunctivae are normal. Right eye exhibits no  discharge. Left eye exhibits no discharge.  Neck: Neck supple.  Cardiovascular: Normal rate, regular rhythm and normal heart sounds.  Exam reveals no gallop and no friction rub.   No murmur heard. Pulmonary/Chest: Effort normal and breath sounds normal. No respiratory distress.  Abdominal: Soft. He exhibits no distension. There is no tenderness.  Musculoskeletal: He exhibits no edema or tenderness.  Mild tenderness across the mid to lower lumbar region both paraspinally&  in the midline. No concerning skin  changes. Strength is 5 out of 5 bilateral lower extremities. Sensation is intact to light touch. Normal patellar reflexes. Palpable DP pulses bilaterally.  Neurological: He is alert.  Skin: Skin is warm and dry.  Psychiatric: He has a normal mood and affect. His behavior is normal. Thought content normal.  Nursing note and vitals reviewed.   ED Course  Procedures (including critical care time) Labs Review Labs Reviewed - No data to display  Imaging Review No results found. I have personally reviewed and evaluated these images and lab results as part of my medical decision-making.   EKG Interpretation None      MDM   Final diagnoses:  Right low back pain, with sciatica presence unspecified    39 year old male with atraumatic lower back pain. Nonfocal neuro exam. No particular concerning features. Plan symptomatic treatment. Return precautions were discussed.    Raeford Razor, MD 08/17/15 1409

## 2015-08-21 ENCOUNTER — Encounter (HOSPITAL_COMMUNITY): Payer: Self-pay | Admitting: Emergency Medicine

## 2015-08-21 ENCOUNTER — Emergency Department (HOSPITAL_COMMUNITY)
Admission: EM | Admit: 2015-08-21 | Discharge: 2015-08-21 | Disposition: A | Discharge status: Home / Self Care | Payer: Self-pay | Attending: Emergency Medicine | Admitting: Emergency Medicine

## 2015-08-21 DIAGNOSIS — Z79899 Other long term (current) drug therapy: Secondary | ICD-10-CM | POA: Insufficient documentation

## 2015-08-21 DIAGNOSIS — K047 Periapical abscess without sinus: Secondary | ICD-10-CM

## 2015-08-21 DIAGNOSIS — Z791 Long term (current) use of non-steroidal anti-inflammatories (NSAID): Secondary | ICD-10-CM | POA: Insufficient documentation

## 2015-08-21 DIAGNOSIS — S025XXA Fracture of tooth (traumatic), initial encounter for closed fracture: Secondary | ICD-10-CM

## 2015-08-21 DIAGNOSIS — Z880 Allergy status to penicillin: Secondary | ICD-10-CM | POA: Insufficient documentation

## 2015-08-21 DIAGNOSIS — Z720 Tobacco use: Secondary | ICD-10-CM | POA: Insufficient documentation

## 2015-08-21 DIAGNOSIS — K029 Dental caries, unspecified: Secondary | ICD-10-CM

## 2015-08-21 DIAGNOSIS — K0381 Cracked tooth: Secondary | ICD-10-CM | POA: Insufficient documentation

## 2015-08-21 MED ORDER — CLINDAMYCIN HCL 150 MG PO CAPS: 300.0000 mg | ORAL_CAPSULE | Freq: Three times a day (TID) | ORAL | Status: DC

## 2015-08-21 MED ORDER — CLINDAMYCIN HCL 150 MG PO CAPS
300.0000 mg | ORAL_CAPSULE | Freq: Once | ORAL | Status: AC
  Administered 2015-08-21: 300 mg via ORAL
  Filled 2015-08-21: qty 2

## 2015-08-21 MED ORDER — TRAMADOL HCL 50 MG PO TABS
50.0000 mg | ORAL_TABLET | Freq: Once | ORAL | Status: AC
  Administered 2015-08-21: 50 mg via ORAL
  Filled 2015-08-21: qty 1

## 2015-08-21 MED ORDER — TRAMADOL HCL 50 MG PO TABS: 50.0000 mg | ORAL_TABLET | Freq: Four times a day (QID) | ORAL | Status: DC | PRN

## 2015-08-21 NOTE — ED Notes (Signed)
Pt verbalized understanding of no driving and to use caution within 4 hours of taking pain meds due to meds cause drowsiness 

## 2015-08-21 NOTE — ED Notes (Signed)
Instructed pt to take all of antibiotics as prescribed. 

## 2015-08-21 NOTE — ED Provider Notes (Signed)
CSN: [id number]     Arrival date & time 08/21/15  4098 History   None    Chief Complaint  Patient presents with  . Dental Pain     (Consider location/radiation/quality/duration/timing/severity/associated sxs/prior Treatment) Patient is a 39 y.o. male presenting with tooth pain. The history is provided by the patient. No language interpreter was used.  Dental Pain Location:  Upper Upper teeth location:  12/LU 1st bicuspid Quality:  Throbbing and constant Severity:  Moderate Onset quality:  Gradual Duration:  2 days Timing:  Constant Progression:  Worsening Chronicity:  New Context: abscess and dental fracture   Relieved by:  Nothing Worsened by:  Cold food/drink and pressure Ineffective treatments:  NSAIDs Associated symptoms: facial pain, facial swelling and gum swelling   Risk factors: lack of dental care and smoking    Zyden Suman Christoffersen is a 39 y.o. male who presents to the ED with dental pain. He reports eating last night and broke a tooth. He took ibuprofen 600 mg last night. When he got up this am he noted increased pain and swelling of the left side of his face. He has an appointment with his dentist Oct. 13th but since the pain is worse and there is swelling of the face he decided to come in tonight.   Past Medical History  Diagnosis Date  . Back pain    Past Surgical History  Procedure Laterality Date  . Collarbone    . Orthopedic surgery    . Hand surgery     History reviewed. No pertinent family history. Social History  Substance Use Topics  . Smoking status: Current Some Day Smoker -- 0.50 packs/day    Types: Cigarettes  . Smokeless tobacco: None  . Alcohol Use: No    Review of Systems  Constitutional:       ? Low grade fever  HENT: Positive for dental problem and facial swelling.   all other systems negative    Allergies  Penicillins; Bee venom; and Xylocaine dental  Home Medications   Prior to Admission medications   Medication Sig Start  Date End Date Taking? Authorizing Provider  clindamycin (CLEOCIN) 150 MG capsule Take 2 capsules (300 mg total) by mouth 3 (three) times daily. 08/21/15   Hope Orlene Och, NP  cyclobenzaprine (FLEXERIL) 10 MG tablet Take 10 mg by mouth 3 (three) times daily as needed for muscle spasms. 08/21/14   [name] Triplett, PA-C  cyclobenzaprine (FLEXERIL) 10 MG tablet Take 1 tablet (10 mg total) by mouth 2 (two) times daily as needed for muscle spasms. 09/08/14   Donnetta Hutching, MD  ibuprofen (ADVIL,MOTRIN) 200 MG tablet Take 200 mg by mouth every 6 (six) hours as needed for headache.    Historical Provider, MD  methocarbamol (ROBAXIN) 500 MG tablet Take 1 tablet (500 mg total) by mouth 2 (two) times daily as needed for muscle spasms. 09/10/14   Eber Hong, MD  methocarbamol (ROBAXIN) 500 MG tablet Take 1 tablet (500 mg total) by mouth every 8 (eight) hours as needed for muscle spasms. 08/09/15   Raeford Razor, MD  naproxen (NAPROSYN) 500 MG tablet Take 1 tablet (500 mg total) by mouth 2 (two) times daily. 09/08/14   Donnetta Hutching, MD  naproxen (NAPROSYN) 500 MG tablet Take 1 tablet (500 mg total) by mouth 2 (two) times daily with a meal. 09/10/14   Eber Hong, MD  oxyCODONE-acetaminophen (PERCOCET) 5-325 MG per tablet Take 2 tablets by mouth every 4 (four) hours as needed. 09/08/14  Donnetta Hutching, MD  oxyCODONE-acetaminophen (PERCOCET/ROXICET) 5-325 MG per tablet Take 1 tablet by mouth every 4 (four) hours as needed for moderate pain. 08/21/14   [name] Triplett, PA-C  oxyCODONE-acetaminophen (PERCOCET/ROXICET) 5-325 MG per tablet Take 1-2 tablets by mouth every 4 (four) hours as needed. 08/09/15   Raeford Razor, MD  Pediatric Multiple Vit-C-FA (PEDIATRIC MULTIVITAMIN) chewable tablet Chew 2 tablets by mouth daily.    Historical Provider, MD  tetrahydrozoline 0.05 % ophthalmic solution Place 1 drop into both eyes daily as needed (Eye Allergies).    Historical Provider, MD  traMADol (ULTRAM) 50 MG tablet Take 1 tablet (50 mg  total) by mouth every 6 (six) hours as needed. 08/21/15   Hope Orlene Och, NP   BP 139/87 mmHg  Pulse 96  Temp(Src) 98.1 F (36.7 C) (Oral)  Resp 16  Ht 6' (1.829 m)  Wt 215 lb (97.523 kg)  BMI 29.15 kg/m2  SpO2 99% Physical Exam  Constitutional: He is oriented to person, place, and time. He appears well-developed and well-nourished. No distress.  HENT:  Right Ear: Tympanic membrane normal.  Left Ear: Tympanic membrane normal.  Nose: Nose normal.  Mouth/Throat: Uvula is midline, oropharynx is clear and moist and mucous membranes are normal.    Tooth broken and decayed to the gumline. Tender on exam. There is erythema and swelling of the gum above the decayed tooth. There is facial swelling noted to the left.  Eyes: EOM are normal.  Neck: Neck supple.  Pulmonary/Chest: Effort normal.  Abdominal: Soft. There is no tenderness.  Musculoskeletal: Normal range of motion.  Neurological: He is alert and oriented to person, place, and time. No cranial nerve deficit.  Skin: Skin is warm and dry.  Psychiatric: He has a normal mood and affect. His behavior is normal.  Nursing note and vitals reviewed.   ED Course  Procedures  Clindamycin 300 mg. PO, Tramadol 50 mg PO MDM  39 y.o. male with dental pain due to caries and abscess. Will treat for pain and infection and he will continue to take ibuprofen. He has a follow up scheduled with his dentist Oct. 13th. Discussed with the patient and all questioned fully answered.  Final diagnoses:  Dental abscess  Pain due to dental caries  Broken tooth, closed, initial encounter        [facility], NP 08/21/15 1910  Benjiman Core, MD 08/21/15 [phone]

## 2015-08-21 NOTE — ED Notes (Signed)
Patient complaining of upper left dental pain since yesterday.  

## 2015-08-21 NOTE — Discharge Instructions (Signed)
Continue to take ibuprofen in addition to the other medications. Do not drive or work while taking the narcotic as it will make you sleepy.  Follow up with your dentist as scheduled.

## 2015-08-27 ENCOUNTER — Emergency Department (HOSPITAL_COMMUNITY)
Admission: EM | Admit: 2015-08-27 | Discharge: 2015-08-27 | Disposition: A | Discharge status: Home / Self Care | Payer: Self-pay | Attending: Emergency Medicine | Admitting: Emergency Medicine

## 2015-08-27 ENCOUNTER — Encounter (HOSPITAL_COMMUNITY): Payer: Self-pay | Admitting: Emergency Medicine

## 2015-08-27 DIAGNOSIS — Z87828 Personal history of other (healed) physical injury and trauma: Secondary | ICD-10-CM | POA: Insufficient documentation

## 2015-08-27 DIAGNOSIS — M549 Dorsalgia, unspecified: Secondary | ICD-10-CM

## 2015-08-27 DIAGNOSIS — M545 Low back pain: Secondary | ICD-10-CM | POA: Insufficient documentation

## 2015-08-27 DIAGNOSIS — G8929 Other chronic pain: Secondary | ICD-10-CM | POA: Insufficient documentation

## 2015-08-27 DIAGNOSIS — Z791 Long term (current) use of non-steroidal anti-inflammatories (NSAID): Secondary | ICD-10-CM | POA: Insufficient documentation

## 2015-08-27 DIAGNOSIS — Z720 Tobacco use: Secondary | ICD-10-CM | POA: Insufficient documentation

## 2015-08-27 DIAGNOSIS — Z880 Allergy status to penicillin: Secondary | ICD-10-CM | POA: Insufficient documentation

## 2015-08-27 DIAGNOSIS — Z79899 Other long term (current) drug therapy: Secondary | ICD-10-CM | POA: Insufficient documentation

## 2015-08-27 DIAGNOSIS — Z792 Long term (current) use of antibiotics: Secondary | ICD-10-CM | POA: Insufficient documentation

## 2015-08-27 MED ORDER — IBUPROFEN 800 MG PO TABS
800.0000 mg | ORAL_TABLET | Freq: Once | ORAL | Status: AC
  Administered 2015-08-27: 800 mg via ORAL
  Filled 2015-08-27: qty 1

## 2015-08-27 MED ORDER — DIAZEPAM 5 MG PO TABS
10.0000 mg | ORAL_TABLET | Freq: Once | ORAL | Status: AC
Start: 2015-08-27 — End: 2015-08-27
  Administered 2015-08-27: 10 mg via ORAL
  Filled 2015-08-27: qty 2

## 2015-08-27 MED ORDER — ACETAMINOPHEN 325 MG PO TABS
650.0000 mg | ORAL_TABLET | Freq: Once | ORAL | Status: AC
  Administered 2015-08-27: 650 mg via ORAL
  Filled 2015-08-27: qty 2
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
## 2015-08-27 NOTE — Discharge Instructions (Signed)
I have reviewed your x-rays. 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 Please see Dr. August Saucer, or the orthopedic specialist of your choice as sone as possible. Please continue your current medications for pain and muscle relaxer. Heating pad may be helpful. Chronic Back Pain  When back pain lasts longer than 3 months, it is called chronic back pain.People with chronic back pain often go through certain periods that are more intense (flare-ups).  CAUSES Chronic back pain can be caused by wear and tear (degeneration) on different structures in your back. These structures include:  The bones of your spine (vertebrae) and the joints surrounding your spinal cord and nerve roots (facets).  The strong, fibrous tissues that connect your vertebrae (ligaments). Degeneration of these structures may result in pressure on your nerves. This can lead to constant pain. HOME CARE INSTRUCTIONS  Avoid bending, heavy lifting, prolonged sitting, and activities which make the problem worse.  Take brief periods of rest throughout the day to reduce your pain. Lying down or standing usually is better than sitting while you are resting.  Take over-the-counter or prescription medicines only as directed by your caregiver. SEEK IMMEDIATE MEDICAL CARE IF:   You have weakness or numbness in one of your legs or feet.  You have trouble controlling your bladder or bowels.  You have nausea, vomiting, abdominal pain, shortness of breath, or fainting.   This information is not intended to replace advice given to you by your health care provider. Make sure you discuss any questions you have with your health care provider.   Document Released: 12/10/2004 Document Revised: 01/25/2012 Document Reviewed: 04/22/2015 Elsevier Interactive Patient Education [employer]
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
## 2015-09-17 ENCOUNTER — Emergency Department (HOSPITAL_COMMUNITY)
Admission: EM | Admit: 2015-09-17 | Discharge: 2015-09-17 | Disposition: A | Discharge status: Home / Self Care | Payer: Self-pay | Attending: Emergency Medicine | Admitting: Emergency Medicine

## 2015-09-17 ENCOUNTER — Encounter (HOSPITAL_COMMUNITY): Payer: Self-pay | Admitting: Emergency Medicine

## 2015-09-17 ENCOUNTER — Emergency Department (HOSPITAL_COMMUNITY): Payer: Self-pay

## 2015-09-17 DIAGNOSIS — R200 Anesthesia of skin: Secondary | ICD-10-CM | POA: Insufficient documentation

## 2015-09-17 DIAGNOSIS — S46911A Strain of unspecified muscle, fascia and tendon at shoulder and upper arm level, right arm, initial encounter: Secondary | ICD-10-CM | POA: Insufficient documentation

## 2015-09-17 DIAGNOSIS — Z792 Long term (current) use of antibiotics: Secondary | ICD-10-CM | POA: Insufficient documentation

## 2015-09-17 DIAGNOSIS — Z791 Long term (current) use of non-steroidal anti-inflammatories (NSAID): Secondary | ICD-10-CM | POA: Insufficient documentation

## 2015-09-17 DIAGNOSIS — Z79899 Other long term (current) drug therapy: Secondary | ICD-10-CM | POA: Insufficient documentation

## 2015-09-17 DIAGNOSIS — Y998 Other external cause status: Secondary | ICD-10-CM | POA: Insufficient documentation

## 2015-09-17 DIAGNOSIS — Z880 Allergy status to penicillin: Secondary | ICD-10-CM | POA: Insufficient documentation

## 2015-09-17 DIAGNOSIS — R202 Paresthesia of skin: Secondary | ICD-10-CM | POA: Insufficient documentation

## 2015-09-17 DIAGNOSIS — Y9389 Activity, other specified: Secondary | ICD-10-CM | POA: Insufficient documentation

## 2015-09-17 DIAGNOSIS — F17210 Nicotine dependence, cigarettes, uncomplicated: Secondary | ICD-10-CM | POA: Insufficient documentation

## 2015-09-17 DIAGNOSIS — W208XXA Other cause of strike by thrown, projected or falling object, initial encounter: Secondary | ICD-10-CM | POA: Insufficient documentation

## 2015-09-17 DIAGNOSIS — Y9289 Other specified places as the place of occurrence of the external cause: Secondary | ICD-10-CM | POA: Insufficient documentation

## 2015-09-17 MED ORDER — HYDROCODONE-ACETAMINOPHEN 5-325 MG PO TABS: ORAL_TABLET | ORAL | Status: DC

## 2015-09-17 MED ORDER — HYDROCODONE-ACETAMINOPHEN 5-325 MG PO TABS
1.0000 | ORAL_TABLET | Freq: Once | ORAL | Status: AC
Start: 2015-09-17 — End: 2015-09-17
  Administered 2015-09-17: 1 via ORAL
  Filled 2015-09-17: qty 1

## 2015-09-17 MED ORDER — NAPROXEN 500 MG PO TABS: 500.0000 mg | ORAL_TABLET | Freq: Two times a day (BID) | ORAL | Status: DC

## 2015-09-17 MED ORDER — IBUPROFEN 800 MG PO TABS
800.0000 mg | ORAL_TABLET | Freq: Once | ORAL | Status: AC
  Administered 2015-09-17: 800 mg via ORAL
  Filled 2015-09-17: qty 1

## 2015-09-17 NOTE — Discharge Instructions (Signed)
Muscle Strain °A muscle strain (pulled muscle) happens when a muscle is stretched beyond normal length. It happens when a sudden, violent force stretches your muscle too far. Usually, a few of the fibers in your muscle are torn. Muscle strain is common in athletes. Recovery usually takes 1-2 weeks. Complete healing takes 5-6 weeks.  °HOME CARE  °· Follow the PRICE method of treatment to help your injury get better. Do this the first 2-3 days after the injury: °¨ Protect. Protect the muscle to keep it from getting injured again. °¨ Rest. Limit your activity and rest the injured body part. °¨ Ice. Put ice in a plastic bag. Place a towel between your skin and the bag. Then, apply the ice and leave it on from 15-20 minutes each hour. After the third day, switch to moist heat packs. °¨ Compression. Use a splint or elastic bandage on the injured area for comfort. Do not put it on too tightly. °¨ Elevate. Keep the injured body part above the level of your heart. °· Only take medicine as told by your doctor. °· Warm up before doing exercise to prevent future muscle strains. °GET HELP IF:  °· You have more pain or puffiness (swelling) in the injured area. °· You feel numbness, tingling, or notice a loss of strength in the injured area. °MAKE SURE YOU:  °· Understand these instructions. °· Will watch your condition. °· Will get help right away if you are not doing well or get worse. °  °This information is not intended to replace advice given to you by your health care provider. Make sure you discuss any questions you have with your health care provider. °  °Document Released: 08/11/2008 Document Revised: 08/23/2013 Document Reviewed: 06/01/2013 °Elsevier Interactive Patient Education ©2016 [employer] Inc[employer] ° °

## 2015-09-17 NOTE — ED Notes (Signed)
Pt c/o rt shoulder pain after working on car.
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
## 2015-10-16 ENCOUNTER — Emergency Department (HOSPITAL_COMMUNITY)
Admission: EM | Admit: 2015-10-16 | Discharge: 2015-10-16 | Disposition: A | Discharge status: Home / Self Care | Payer: Self-pay | Attending: Emergency Medicine | Admitting: Emergency Medicine

## 2015-10-16 DIAGNOSIS — G8929 Other chronic pain: Secondary | ICD-10-CM | POA: Insufficient documentation

## 2015-10-16 DIAGNOSIS — M545 Low back pain: Secondary | ICD-10-CM | POA: Insufficient documentation

## 2015-10-16 DIAGNOSIS — M549 Dorsalgia, unspecified: Secondary | ICD-10-CM

## 2015-10-16 DIAGNOSIS — F17210 Nicotine dependence, cigarettes, uncomplicated: Secondary | ICD-10-CM | POA: Insufficient documentation

## 2015-10-16 DIAGNOSIS — Z880 Allergy status to penicillin: Secondary | ICD-10-CM | POA: Insufficient documentation

## 2015-10-16 DIAGNOSIS — Z79899 Other long term (current) drug therapy: Secondary | ICD-10-CM | POA: Insufficient documentation

## 2015-10-16 MED ORDER — CYCLOBENZAPRINE HCL 10 MG PO TABS: 10.0000 mg | ORAL_TABLET | Freq: Two times a day (BID) | ORAL | Status: DC | PRN

## 2015-10-16 MED ORDER — HYDROCODONE-ACETAMINOPHEN 5-325 MG PO TABS
2.0000 | ORAL_TABLET | Freq: Once | ORAL | Status: AC
  Administered 2015-10-16: 2 via ORAL
  Filled 2015-10-16: qty 2

## 2015-10-16 MED ORDER — NAPROXEN 500 MG PO TABS: 500.0000 mg | ORAL_TABLET | Freq: Two times a day (BID) | ORAL | Status: DC

## 2015-10-16 NOTE — ED Notes (Signed)
Patient c/o low back pain. Hx of same. No new injuries.

## 2015-10-16 NOTE — Discharge Instructions (Signed)

## 2015-10-16 NOTE — ED Provider Notes (Signed)
CSN: [id number]     Arrival date & time 10/16/15  1532 History   First MD Initiated Contact with Patient 10/16/15 1607     Chief Complaint  Patient presents with  . Back Pain     (Consider location/radiation/quality/duration/timing/severity/associated sxs/prior Treatment) Patient is a 39 y.o. male presenting with back pain. The history is provided by the patient. No language interpreter was used.  Back Pain Location:  Lumbar spine Quality:  Aching and stiffness Stiffness is present:  Unable to specify Radiates to:  Does not radiate Pain severity:  Moderate Onset quality:  Gradual Timing:  Constant Progression:  Worsening Chronicity:  Recurrent Relieved by:  Nothing Worsened by:  Nothing tried Ineffective treatments:  None tried Associated symptoms: no numbness     Past Medical History  Diagnosis Date  . Back pain    Past Surgical History  Procedure Laterality Date  . Collarbone    . Orthopedic surgery    . Hand surgery     No family history on file. Social History  Substance Use Topics  . Smoking status: Current Some Day Smoker -- 0.50 packs/day    Types: Cigarettes  . Smokeless tobacco: Not on file  . Alcohol Use: No    Review of Systems  Musculoskeletal: Positive for back pain.  Neurological: Negative for numbness.  All other systems reviewed and are negative.     Allergies  Penicillins; Bee venom; and Xylocaine dental  Home Medications   Prior to Admission medications   Medication Sig Start Date End Date Taking? Authorizing Provider  cyclobenzaprine (FLEXERIL) 10 MG tablet Take 1 tablet (10 mg total) by mouth 2 (two) times daily as needed for muscle spasms. 10/16/15   Elson Areas, PA-C  HYDROcodone-acetaminophen (NORCO/VICODIN) 5-325 MG tablet Take one tab po q 4-6 hrs prn pain 09/17/15   [name] Triplett, PA-C  ibuprofen (ADVIL,MOTRIN) 200 MG tablet Take 200 mg by mouth every 6 (six) hours as needed for headache.    Historical Provider, MD   naproxen (NAPROSYN) 500 MG tablet Take 1 tablet (500 mg total) by mouth 2 (two) times daily with a meal. 10/16/15   Elson Areas, PA-C  Pediatric Multiple Vit-C-FA (PEDIATRIC MULTIVITAMIN) chewable tablet Chew 2 tablets by mouth daily.    Historical Provider, MD  tetrahydrozoline 0.05 % ophthalmic solution Place 1 drop into both eyes daily as needed (Eye Allergies).    Historical Provider, MD   BP 126/96 mmHg  Pulse 91  Temp(Src) 98 F (36.7 C) (Oral)  Resp 18  Ht 6' (1.829 m)  Wt 99.791 kg  BMI 29.83 kg/m2  SpO2 99% Physical Exam  Constitutional: He is oriented to person, place, and time. He appears well-developed and well-nourished.  HENT:  Head: Normocephalic.  Eyes: EOM are normal.  Neck: Normal range of motion.  Pulmonary/Chest: Effort normal.  Abdominal: He exhibits no distension.  Musculoskeletal:  Diffusely tender ls spine,  Pain with range of motion, nv and ns intact  Neurological: He is alert and oriented to person, place, and time.  Psychiatric: He has a normal mood and affect.  Nursing note and vitals reviewed.   ED Course  Procedures (including critical care time) Labs Review Labs Reviewed - No data to display  Imaging Review No results found. I have personally reviewed and evaluated these images and lab results as part of my medical decision-making.   EKG Interpretation None      MDM   Final diagnoses:  Chronic back pain  Flexeril Naprosyn (Pt counseled on avoiding narcotics for chronic pain)    Elson AreasLeslie K Casara Perrier, PA-C 10/16/15 [address]1626  Kenlie Seki K StoughtonSofia, [place] JerseyPA-C 10/16/15 1626  Donnetta HutchingBrian Cook, MD 10/17/15 1721

## 2015-10-21 ENCOUNTER — Emergency Department (HOSPITAL_COMMUNITY)
Admission: EM | Admit: 2015-10-21 | Discharge: 2015-10-21 | Disposition: A | Discharge status: Home / Self Care | Payer: Self-pay | Attending: Emergency Medicine | Admitting: Emergency Medicine

## 2015-10-21 ENCOUNTER — Encounter (HOSPITAL_COMMUNITY): Payer: Self-pay | Admitting: Emergency Medicine

## 2015-10-21 ENCOUNTER — Emergency Department (HOSPITAL_COMMUNITY): Payer: Self-pay

## 2015-10-21 DIAGNOSIS — M5116 Intervertebral disc disorders with radiculopathy, lumbar region: Secondary | ICD-10-CM

## 2015-10-21 DIAGNOSIS — R32 Unspecified urinary incontinence: Secondary | ICD-10-CM | POA: Insufficient documentation

## 2015-10-21 DIAGNOSIS — M5430 Sciatica, unspecified side: Secondary | ICD-10-CM

## 2015-10-21 DIAGNOSIS — M5117 Intervertebral disc disorders with radiculopathy, lumbosacral region: Secondary | ICD-10-CM | POA: Insufficient documentation

## 2015-10-21 DIAGNOSIS — Z79899 Other long term (current) drug therapy: Secondary | ICD-10-CM | POA: Insufficient documentation

## 2015-10-21 DIAGNOSIS — Z791 Long term (current) use of non-steroidal anti-inflammatories (NSAID): Secondary | ICD-10-CM | POA: Insufficient documentation

## 2015-10-21 DIAGNOSIS — G8929 Other chronic pain: Secondary | ICD-10-CM | POA: Insufficient documentation

## 2015-10-21 DIAGNOSIS — F17210 Nicotine dependence, cigarettes, uncomplicated: Secondary | ICD-10-CM | POA: Insufficient documentation

## 2015-10-21 DIAGNOSIS — Z880 Allergy status to penicillin: Secondary | ICD-10-CM | POA: Insufficient documentation

## 2015-10-21 MED ORDER — PROMETHAZINE HCL 12.5 MG PO TABS
12.5000 mg | ORAL_TABLET | Freq: Once | ORAL | Status: AC
  Administered 2015-10-21: 12.5 mg via ORAL
  Filled 2015-10-21: qty 1

## 2015-10-21 MED ORDER — DEXAMETHASONE 6 MG PO TABS: 6.0000 mg | ORAL_TABLET | Freq: Two times a day (BID) | ORAL | Status: DC

## 2015-10-21 MED ORDER — HYDROCODONE-ACETAMINOPHEN 5-325 MG PO TABS: 1.0000 | ORAL_TABLET | ORAL | Status: DC | PRN

## 2015-10-21 MED ORDER — DIAZEPAM 5 MG PO TABS
10.0000 mg | ORAL_TABLET | Freq: Once | ORAL | Status: AC
  Administered 2015-10-21: 10 mg via ORAL
  Filled 2015-10-21: qty 2

## 2015-10-21 MED ORDER — DIAZEPAM 5 MG PO TABS: 5.0000 mg | ORAL_TABLET | Freq: Three times a day (TID) | ORAL | Status: DC

## 2015-10-21 MED ORDER — MORPHINE SULFATE (PF) 4 MG/ML IV SOLN
8.0000 mg | Freq: Once | INTRAVENOUS | Status: AC
  Administered 2015-10-21: 8 mg via INTRAMUSCULAR
  Filled 2015-10-21: qty 2

## 2015-10-21 MED ORDER — DEXAMETHASONE SODIUM PHOSPHATE 4 MG/ML IJ SOLN
8.0000 mg | Freq: Once | INTRAMUSCULAR | Status: AC
  Administered 2015-10-21: 8 mg via INTRAMUSCULAR
  Filled 2015-10-21: qty 2

## 2015-10-21 NOTE — Discharge Instructions (Signed)
Your MRI reveals degenerative disc disease, worse at the L5S1 area. Please call Dr Wynetta Emery for an appointment this week, or the beginning of next week. Heating pad to the back may be helpful. Use your crutches until seen by Dr Wynetta Emery. Use valium and Decadron daily with a meal. Use norco for more severe pain. Norco and valium may cause drowsiness, use with caution. Please call the [facility] to establish a primary MD to assist with your management care. Sciatica Sciatica is pain, weakness, numbness, or tingling along the path of the sciatic nerve. The nerve starts in the lower back and runs down the back of each leg. The nerve controls the muscles in the lower leg and in the back of the knee, while also providing sensation to the back of the thigh, lower leg, and the sole of your foot. Sciatica is a symptom of another medical condition. For instance, nerve damage or certain conditions, such as a herniated disk or bone spur on the spine, pinch or put pressure on the sciatic nerve. This causes the pain, weakness, or other sensations normally associated with sciatica. Generally, sciatica only affects one side of the body. CAUSES   Herniated or slipped disc.  Degenerative disk disease.  A pain disorder involving the narrow muscle in the buttocks (piriformis syndrome).  Pelvic injury or fracture.  Pregnancy.  Tumor (rare). SYMPTOMS  Symptoms can vary from mild to very severe. The symptoms usually travel from the low back to the buttocks and down the back of the leg. Symptoms can include:  Mild tingling or dull aches in the lower back, leg, or hip.  Numbness in the back of the calf or sole of the foot.  Burning sensations in the lower back, leg, or hip.  Sharp pains in the lower back, leg, or hip.  Leg weakness.  Severe back pain inhibiting movement. These symptoms may get worse with coughing, sneezing, laughing, or prolonged sitting or standing. Also, being overweight may worsen  symptoms. DIAGNOSIS  Your caregiver will perform a physical exam to look for common symptoms of sciatica. He or she may ask you to do certain movements or activities that would trigger sciatic nerve pain. Other tests may be performed to find the cause of the sciatica. These may include:  Blood tests.  X-rays.  Imaging tests, such as an MRI or CT scan. TREATMENT  Treatment is directed at the cause of the sciatic pain. Sometimes, treatment is not necessary and the pain and discomfort goes away on its own. If treatment is needed, your caregiver may suggest:  Over-the-counter medicines to relieve pain.  Prescription medicines, such as anti-inflammatory medicine, muscle relaxants, or narcotics.  Applying heat or ice to the painful area.  Steroid injections to lessen pain, irritation, and inflammation around the nerve.  Reducing activity during periods of pain.  Exercising and stretching to strengthen your abdomen and improve flexibility of your spine. Your caregiver may suggest losing weight if the extra weight makes the back pain worse.  Physical therapy.  Surgery to eliminate what is pressing or pinching the nerve, such as a bone spur or part of a herniated disk. HOME CARE INSTRUCTIONS   Only take over-the-counter or prescription medicines for pain or discomfort as directed by your caregiver.  Apply ice to the affected area for 20 minutes, 3-4 times a day for the first 48-72 hours. Then try heat in the same way.  Exercise, stretch, or perform your usual activities if these do not aggravate your  pain.  Attend physical therapy sessions as directed by your caregiver.  Keep all follow-up appointments as directed by your caregiver.  Do not wear high heels or shoes that do not provide proper support.  Check your mattress to see if it is too soft. A firm mattress may lessen your pain and discomfort. SEEK IMMEDIATE MEDICAL CARE IF:   You lose control of your bowel or bladder  (incontinence).  You have increasing weakness in the lower back, pelvis, buttocks, or legs.  You have redness or swelling of your back.  You have a burning sensation when you urinate.  You have pain that gets worse when you lie down or awakens you at night.  Your pain is worse than you have experienced in the past.  Your pain is lasting longer than 4 weeks.  You are suddenly losing weight without reason. MAKE SURE YOU:  Understand these instructions.  Will watch your condition.  Will get help right away if you are not doing well or get worse.   This information is not intended to replace advice given to you by your health care provider. Make sure you discuss any questions you have with your health care provider.   Document Released: 10/27/2001 Document Revised: 07/24/2015 Document Reviewed: 03/13/2012 Elsevier Interactive Patient Education 2016 [employer]  Herniated Disk A herniated disk occurs when a disk in your spine bulges out too far. This condition is also called a ruptured disk or slipped disk. Your spine (backbone) is made up of bones called vertebrae. Between each pair of vertebrae is an oval disk with a soft, spongy center that acts as a shock absorber when you move. The spongy center is surrounded by a tough outer ring. When you have a herniated disk, the spongy center of the disk bulges out or ruptures through the outer ring. A herniated disk can press on a nerve between your vertebrae and cause pain. A herniated disk can occur anywhere in your back or neck area, but the lower back is the most common spot. CAUSES  In many cases, a herniated disk occurs just from getting older. As you age, the spongy insides of your disks tend to shrink and dry out. A herniated disk can result from gradual wear and tear. Injury or sudden strain can also cause a herniated disk.  RISK FACTORS Aging is the main risk factor for a herniated disk. Other risk factors include:  Being a man  between the ages of 43 and 63 years.  Having a job that requires heavy lifting, bending, or twisting.  Having a job that requires long hours of driving.  Not getting enough exercise.  Being overweight.  Smoking. SIGNS AND SYMPTOMS  Signs and symptoms depend on which disk is herniated.  For a herniated disk in the lower back, you may have sharp pain in:  One part of your leg, hip, or buttocks.  The back of your calf.  The top or sole of your foot (sciatica).   For a herniated disk in the neck, you may feel pain:  When you move your neck.  Near or over your shoulder blade.  That moves to your upper arm, forearm, or fingers.   You may also have muscle weakness. It may be hard to:  Lift your leg or arm.  Stand on your toes.  Squeeze tightly with one of your hands.  Other symptoms can include:  Numbness or tingling in the affected areas of your body.  Loss of bladder or  bowel control. This is a rare but serious sign of a severe herniated disk in the lower back. DIAGNOSIS  Your health care provider will do a physical exam. During this exam, you may have to move certain body parts or assume various positions. For example, your health care provider may do the straight-leg test. This is a good way to test for a herniated disk in your lower back. In this test, the health care provider lifts your leg while you lie on your back. This is to see if you feel pain down your leg. Your health care provider will also check for numbness or loss of feeling.  Your health care provider will also check your:  Reflexes.  Muscle strength.  Posture.  Other tests may be done to help in making a diagnosis. These may include:  An X-ray of the spine to rule out other causes of back pain.   Other imaging studies, such as an MRI or CT scan. This is to check whether the herniated disk is pressing on your spinal canal.  Electromyography (EMG). This test checks the nerves that control  muscles. It is sometimes used to identify the specific area of nerve involvement.  TREATMENT  In many cases, herniated disk symptoms go away over a period of days or weeks. You will most likely be free of symptoms in 3-4 months. Treatment may include the following:  The initial treatment for a herniated disk is ashort period of rest.  Bed rest is often limited to 1 or 2 days. Resting for too long delays recovery.  If you have a herniated disk in your lower back, you should avoid sitting as much as possible because sitting increases pressure on the disk.  Medicines. These may include:   Nonsteroidal anti-inflammatory drugs (NSAIDs).  Muscle relaxants for back spasms.  Narcotic pain medicine if your pain is very bad.   Steroid injections. You may need these along the involved nerve root to help control pain. The steroid is injected in the area of the herniated disk. It helps by reducing swelling around the disk.  Physical therapy. This may include exercises to strengthen the muscles that help support your spine.   You may need surgery if other treatments do not work.  HOME CARE INSTRUCTIONS Follow all your health care provider's instructions. These may include:  Take all medicines as directed by your health care provider.  Rest for 2 days and then start moving.  Do not sit or stand for long periods of time.  Maintain good posture when sitting and standing.  Avoid movements that cause pain, such as bending or lifting.  When you are able to start lifting things again:  [place] with your knees.  Keep your back straight.  Hold heavy objects close to your body.  If you are overweight, ask your health care provider to help you start a weight-loss program.  When you are able to start exercising, ask your health care provider how much and what type of exercise is best for you.  Work with a physical therapist on stretching and strengthening exercises for your back.  Do not  wear high-heeled shoes.  Do not sleep on your belly.  Do not smoke.  Keep all follow-up visits as directed by your health care provider. SEEK MEDICAL CARE IF:  You have back or neck pain that is not getting better after 4 weeks.  You have very bad pain in your back or neck.  You develop numbness, tingling, or weakness  along with pain. SEEK IMMEDIATE MEDICAL CARE IF:   You have numbness, tingling, or weakness that makes you unable to use your arms or legs.  You lose control of your bladder or bowels.  You have dizziness or fainting.  You have shortness of breath.  MAKE SURE YOU:   Understand these instructions.  Will watch your condition.  Will get help right away if you are not doing well or get worse.   This information is not intended to replace advice given to you by your health care provider. Make sure you discuss any questions you have with your health care provider.   Document Released: 10/30/2000 Document Revised: 11/23/2014 Document Reviewed: 10/06/2013 Elsevier Interactive Patient Education [employer]

## 2015-10-21 NOTE — ED Provider Notes (Signed)
CSN: [id number]     Arrival date & time 10/21/15  1444 History  By signing my name below, I, [name], attest that this documentation has been prepared under the direction and in the presence of Ivery QualeHobson Jakob Kimberlin, PA-C. Electronically Signed: [name], ED Scribe. 10/21/2015. 3:56 PM.   Chief Complaint  Patient presents with  . Back Pain   Patient is a 39 y.o. male presenting with back pain. The history is provided by the patient. No language interpreter was used.  Back Pain Location:  Lumbar spine Radiates to:  R posterior upper leg and L posterior upper leg Pain severity:  Moderate Pain is:  Same all the time Onset quality:  Sudden Timing:  Constant Chronicity:  Chronic Associated symptoms: bladder incontinence   Associated symptoms: no bowel incontinence     HPI Comments: Marvin Schroeder is a 39 y.o. male who presents to the Emergency Department complaining of constant, moderate, chronic lower back pain which worsened yesterday. He notes that he was climbing a set of stairs which exacerbated his chronic symptoms and caused 1x urinary incontinence. Pt was seen for the same symptoms on 11/30 and now notes that his pain worsens with any pressure to his right gluteal region, when standing or when ambulating with a radiation of pain down his posterior bilateral legs. He notes taking the flexeril and antiinflammatories as prescribed which he says provided short term relief until yesterday's episode. Pt denies any other associated symptoms at this time.   Past Medical History  Diagnosis Date  . Back pain    Past Surgical History  Procedure Laterality Date  . Collarbone    . Orthopedic surgery    . Hand surgery     History reviewed. No pertinent family history. Social History  Substance Use Topics  . Smoking status: Current Some Day Smoker -- 0.50 packs/day    Types: Cigarettes  . Smokeless tobacco: None  . Alcohol Use: No    Review of Systems  Gastrointestinal:  Negative for bowel incontinence.  Genitourinary: Positive for bladder incontinence.  Musculoskeletal: Positive for back pain.  All other systems reviewed and are negative.  Allergies  Penicillins; Bee venom; and Xylocaine dental  Home Medications   Prior to Admission medications   Medication Sig Start Date End Date Taking? Authorizing Provider  cyclobenzaprine (FLEXERIL) 10 MG tablet Take 1 tablet (10 mg total) by mouth 2 (two) times daily as needed for muscle spasms. 10/16/15   Elson AreasLeslie K Sofia, PA-C  HYDROcodone-acetaminophen (NORCO/VICODIN) 5-325 MG tablet Take one tab po q 4-6 hrs prn pain 09/17/15   [name] Triplett, PA-C  ibuprofen (ADVIL,MOTRIN) 200 MG tablet Take 200 mg by mouth every 6 (six) hours as needed for headache.    Historical Provider, MD  naproxen (NAPROSYN) 500 MG tablet Take 1 tablet (500 mg total) by mouth 2 (two) times daily with a meal. 10/16/15   Elson AreasLeslie K Sofia, PA-C  Pediatric Multiple Vit-C-FA (PEDIATRIC MULTIVITAMIN) chewable tablet Chew 2 tablets by mouth daily.    Historical Provider, MD  tetrahydrozoline 0.05 % ophthalmic solution Place 1 drop into both eyes daily as needed (Eye Allergies).    Historical Provider, MD   BP 145/87 mmHg  Pulse 68  Temp(Src) 98 F (36.7 C) (Oral)  Resp 18  Ht 6' (1.829 m)  Wt 220 lb (99.791 kg)  BMI 29.83 kg/m2  SpO2 100% Physical Exam  Constitutional: He is oriented to person, place, and time. He appears well-developed and well-nourished.  Non-toxic appearance.  HENT:  Head: Normocephalic and atraumatic.  Right Ear: Tympanic membrane and external ear normal.  Left Ear: Tympanic membrane and external ear normal.  Mouth/Throat: No oropharyngeal exudate.  Eyes: EOM and lids are normal. Pupils are equal, round, and reactive to light.  Neck: Normal range of motion. Neck supple. Carotid bruit is not present. No tracheal deviation present.  Cardiovascular: Normal rate, regular rhythm, normal heart sounds, intact distal pulses and  normal pulses.   Pulmonary/Chest: Effort normal and breath sounds normal. No respiratory distress.  Abdominal: Soft. Bowel sounds are normal. There is no tenderness. There is no guarding.  Musculoskeletal:       Lumbar back: He exhibits decreased range of motion, tenderness, pain and spasm.  Lymphadenopathy:       Head (right side): No submandibular adenopathy present.       Head (left side): No submandibular adenopathy present.    He has no cervical adenopathy.  Neurological: He is alert and oriented to person, place, and time. He has normal strength. No cranial nerve deficit or sensory deficit.  Sensory intact; motor could not be adequately tested due to pt's pain  Skin: Skin is warm and dry. He is not diaphoretic.  Psychiatric: He has a normal mood and affect. His speech is normal and behavior is normal.  Nursing note and vitals reviewed.  ED Course  Procedures  DIAGNOSTIC STUDIES: Oxygen Saturation is 100% on RA, normal by my interpretation.    COORDINATION OF CARE: 4:01 PM Pt presents with worsening chronic lower back pain. Discussed next steps with pt and he agreed to plan.   Labs Review Labs Reviewed - No data to display  Imaging Review No results found.   EKG Interpretation None      MDM  Vital signs reviewed. Previous emergency department records and imaging reviewed. The patient states that his symptoms have progressed. He states that he lost control of his urine when this pain hit while going up steps a few days ago. He has been in pain that is not relieved by Flexeril and anti-inflammatory medication. The patient states he cannot apply weight to the right lower extremity because of the severity of the pain. Case was discussed with Dr.  Clayborne Dana. MRI of the lumbar spine has been ordered. Intramuscular pain medicine has been provided.  Pain somewhat improved with pain meds. MRI - reveals central disc herniation at L5-S1. Slight indentation of the thecal sac, but no  visible focal compression. Case discussed with Dr Wynetta Emery, pt to be placed on steroids. He will see pt in the office this week, or beginning of next week.Pt has voided since the ED visit. Findings and plan discussed with patient.   Final diagnoses:  None    *I have reviewed nursing notes, vital signs, and all appropriate lab and imaging results for this patient.* **I personally performed the services described in this documentation, which was scribed in my presence. The recorded information has been reviewed and is accurate.    Ivery Quale, PA-C 10/21/15 1834  Marily Memos, MD 10/25/15 2231

## 2015-10-21 NOTE — ED Notes (Signed)
Injury to Back Sep 08, 2014.  Here last week for lower back pain and pain is not improving.  Rates pain 10/10.

## 2015-11-04 ENCOUNTER — Emergency Department (HOSPITAL_COMMUNITY)
Admission: EM | Admit: 2015-11-04 | Discharge: 2015-11-04 | Disposition: A | Discharge status: Home / Self Care | Payer: Self-pay | Attending: Emergency Medicine | Admitting: Emergency Medicine

## 2015-11-04 ENCOUNTER — Encounter (HOSPITAL_COMMUNITY): Payer: Self-pay | Admitting: Emergency Medicine

## 2015-11-04 DIAGNOSIS — Z79899 Other long term (current) drug therapy: Secondary | ICD-10-CM | POA: Insufficient documentation

## 2015-11-04 DIAGNOSIS — Z880 Allergy status to penicillin: Secondary | ICD-10-CM | POA: Insufficient documentation

## 2015-11-04 DIAGNOSIS — F17210 Nicotine dependence, cigarettes, uncomplicated: Secondary | ICD-10-CM | POA: Insufficient documentation

## 2015-11-04 DIAGNOSIS — M549 Dorsalgia, unspecified: Secondary | ICD-10-CM

## 2015-11-04 DIAGNOSIS — M545 Low back pain: Secondary | ICD-10-CM | POA: Insufficient documentation

## 2015-11-04 DIAGNOSIS — G8929 Other chronic pain: Secondary | ICD-10-CM | POA: Insufficient documentation

## 2015-11-04 DIAGNOSIS — Z7952 Long term (current) use of systemic steroids: Secondary | ICD-10-CM | POA: Insufficient documentation

## 2015-11-04 DIAGNOSIS — M5431 Sciatica, right side: Secondary | ICD-10-CM | POA: Insufficient documentation

## 2015-11-04 MED ORDER — HYDROCODONE-ACETAMINOPHEN 5-325 MG PO TABS: ORAL_TABLET | ORAL | Status: DC

## 2015-11-04 MED ORDER — CYCLOBENZAPRINE HCL 10 MG PO TABS
10.0000 mg | ORAL_TABLET | Freq: Once | ORAL | Status: AC
  Administered 2015-11-04: 10 mg via ORAL
  Filled 2015-11-04: qty 1

## 2015-11-04 MED ORDER — PREDNISONE 10 MG PO TABS: ORAL_TABLET | ORAL | Status: DC

## 2015-11-04 MED ORDER — HYDROCODONE-ACETAMINOPHEN 5-325 MG PO TABS
1.0000 | ORAL_TABLET | Freq: Once | ORAL | Status: AC
  Administered 2015-11-04: 1 via ORAL
  Filled 2015-11-04: qty 1

## 2015-11-04 MED ORDER — CYCLOBENZAPRINE HCL 10 MG PO TABS: 10.0000 mg | ORAL_TABLET | Freq: Three times a day (TID) | ORAL | Status: DC | PRN

## 2015-11-04 NOTE — ED Provider Notes (Signed)
CSN: [id number]     Arrival date & time 11/04/15  1711 History  By signing my name below, I, Jarvis Morganaylor Ferguson, attest that this documentation has been prepared under the direction and in the presence of Pauline Ausammy Kenyotta Dorfman, PA-C  Electronically Signed: Jarvis Morganaylor Ferguson, ED Scribe. 11/04/2015. 5:27 PM.   Chief Complaint  Patient presents with  . Back Pain   The history is provided by the patient and medical records. No language interpreter was used.    HPI Comments: Marvin Schroeder is a 39 y.o. male who presents to the Emergency Department complaining of constant, moderate, burning, chronic low back pain which has been worsening for the past week. Pt was seen 2 weeks ago  in the ED for the same and was referred to see Dr. Wynetta Emeryram but pt did not follow up and states it was not in his paperwork. He had an MRI done at that visit and was found to have central disc herniation at L5-S1. Pt was prescribed Valium, prednisone and Vicodin and has exhausted the medication; he notes the medication provided short term moderate relief. Pt reports the pain radiates down into his bilateral legs with intermittent tingling in lower extremities. He states the pain is exacerbated with movement, improves somewhat with rest. He denies any other alleviating/aggravating factors. He denies any new trauma or injury to the lower back. Pt denies any numbness, weakness, urinary/bowel incontinence, or other associated symptoms.   Past Medical History  Diagnosis Date  . Back pain    Past Surgical History  Procedure Laterality Date  . Collarbone    . Orthopedic surgery    . Hand surgery     History reviewed. No pertinent family history. Social History  Substance Use Topics  . Smoking status: Current Some Day Smoker -- 0.50 packs/day    Types: Cigarettes  . Smokeless tobacco: None  . Alcohol Use: No    Review of Systems  Constitutional: Negative for fever.  Respiratory: Negative for shortness of breath.    Gastrointestinal: Negative for vomiting, abdominal pain and constipation.  Genitourinary: Negative for dysuria, hematuria, flank pain, decreased urine volume and difficulty urinating.  Musculoskeletal: Positive for back pain. Negative for joint swelling.  Skin: Negative for rash.  Neurological: Negative for weakness and numbness.  All other systems reviewed and are negative.     Allergies  Penicillins; Bee venom; and Xylocaine dental  Home Medications   Prior to Admission medications   Medication Sig Start Date End Date Taking? Authorizing Provider  cyclobenzaprine (FLEXERIL) 10 MG tablet Take 1 tablet (10 mg total) by mouth 2 (two) times daily as needed for muscle spasms. 10/16/15   Elson AreasLeslie K Sofia, PA-C  dexamethasone (DECADRON) 6 MG tablet Take 1 tablet (6 mg total) by mouth 2 (two) times daily with a meal. 10/21/15   Ivery QualeHobson Bryant, PA-C  diazepam (VALIUM) 5 MG tablet Take 1 tablet (5 mg total) by mouth 3 (three) times daily. 10/21/15   Ivery QualeHobson Bryant, PA-C  HYDROcodone-acetaminophen (NORCO/VICODIN) 5-325 MG tablet Take 1 tablet by mouth every 4 (four) hours as needed. 10/21/15   Ivery QualeHobson Bryant, PA-C  ibuprofen (ADVIL,MOTRIN) 200 MG tablet Take 600 mg by mouth 2 (two) times daily as needed for headache, mild pain or moderate pain.     Historical Provider, MD  naproxen (NAPROSYN) 500 MG tablet Take 1 tablet (500 mg total) by mouth 2 (two) times daily with a meal. Patient not taking: Reported on 10/21/2015 10/16/15   Elson AreasLeslie K Sofia, PA-C  Triage Vitals: BP 140/79 mmHg  Pulse 90  Temp(Src) 97.7 F (36.5 C) (Oral)  Resp 18  Ht  (1.778 m)  Wt 220 lb (99.791 kg)  BMI 31.57 kg/m2  SpO2 98%  Physical Exam  Constitutional: He is oriented to person, place, and time. He appears well-developed and well-nourished. No distress.  HENT:  Head: Normocephalic and atraumatic.  Eyes: Conjunctivae and EOM are normal.  Neck: Neck supple. No tracheal deviation present.  Cardiovascular: Normal  rate.   Pulmonary/Chest: Effort normal. No respiratory distress.  Musculoskeletal: Normal range of motion.  Tenderness at the lower lumbar spine and right SI joint. Positive SLR on the right. 4/5 strength against resistance of the bilateral LE's.  Dp pulses brisk, distal sensation intact  Neurological: He is alert and oriented to person, place, and time.  No gross motor or sensory deficits  Skin: Skin is warm and dry.  Psychiatric: He has a normal mood and affect. His behavior is normal.  Nursing note and vitals reviewed.   ED Course  Procedures (including critical care time)  DIAGNOSTIC STUDIES: Oxygen Saturation is 98% on RA, normal by my interpretation.    COORDINATION OF CARE:   Review of MRI from 10/21/15 shows Central disc herniation at L5-S1 with caudal migration of disc material. Slight indentation of the thecal sac but no visible focal neural compression.    MDM   Final diagnoses:  Chronic back pain  Sciatica of right side   Pt is well appearing, no concerning sx's for emergent neurological or infectious process.  Pt states that he was not given referral info on his previous visit, but info was reviewed and he was given f/u info for Dr. Wynetta Emery.  I have advised pt that he needs to make f/u arrangements with Dr. Lonie Peak office.      I personally performed the services described in this documentation, which was scribed in my presence. The recorded information has been reviewed and is accurate.     Pauline Aus, PA-C 11/05/15 0112  Dione Booze, MD 11/05/15 2258

## 2015-11-04 NOTE — ED Notes (Addendum)
PT states lower back pain that radiates into his right leg worsening x1 week. PT states was seen by Dr. Romeo AppleHarrison and is supposed to have a spine MD referral for f/u from MRI results. PT states recent steroid medication for spine. PT on crutches and states weakness to right leg.

## 2015-11-04 NOTE — Discharge Instructions (Signed)
°  Sciatica °Sciatica is pain, weakness, numbness, or tingling along your sciatic nerve. The nerve starts in the lower back and runs down the back of each leg. Nerve damage or certain conditions pinch or put pressure on the sciatic nerve. This causes the pain, weakness, and other discomforts of sciatica. °HOME CARE  °· Only take medicine as told by your doctor. °· Apply ice to the affected area for 20 minutes. Do this 3-4 times a day for the first 48-72 hours. Then try heat in the same way. °· Exercise, stretch, or do your usual activities if these do not make your pain worse. °· Go to physical therapy as told by your doctor. °· Keep all doctor visits as told. °· Do not wear high heels or shoes that are not supportive. °· Get a firm mattress if your mattress is too soft to lessen pain and discomfort. °GET HELP RIGHT AWAY IF:  °· You cannot control when you poop (bowel movement) or pee (urinate). °· You have more weakness in your lower back, lower belly (pelvis), butt (buttocks), or legs. °· You have redness or puffiness (swelling) of your back. °· You have a burning feeling when you pee. °· You have pain that gets worse when you lie down. °· You have pain that wakes you from your sleep. °· Your pain is worse than past pain. °· Your pain lasts longer than 4 weeks. °· You are suddenly losing weight without reason. °MAKE SURE YOU:  °· Understand these instructions. °· Will watch this condition. °· Will get help right away if you are not doing well or get worse. °  °This information is not intended to replace advice given to you by your health care provider. Make sure you discuss any questions you have with your health care provider. °  °Document Released: 08/11/2008 Document Revised: 07/24/2015 Document Reviewed: 03/13/2012 °Elsevier Interactive Patient Education ©2016 [employer] ° ° °

## 2015-11-23 ENCOUNTER — Encounter (HOSPITAL_COMMUNITY): Payer: Self-pay

## 2015-11-23 ENCOUNTER — Emergency Department (HOSPITAL_COMMUNITY)
Admission: EM | Admit: 2015-11-23 | Discharge: 2015-11-23 | Disposition: A | Discharge status: Home / Self Care | Payer: Self-pay | Attending: Emergency Medicine | Admitting: Emergency Medicine

## 2015-11-23 DIAGNOSIS — F17210 Nicotine dependence, cigarettes, uncomplicated: Secondary | ICD-10-CM | POA: Insufficient documentation

## 2015-11-23 DIAGNOSIS — Z880 Allergy status to penicillin: Secondary | ICD-10-CM | POA: Insufficient documentation

## 2015-11-23 DIAGNOSIS — G8929 Other chronic pain: Secondary | ICD-10-CM | POA: Insufficient documentation

## 2015-11-23 DIAGNOSIS — M5441 Lumbago with sciatica, right side: Secondary | ICD-10-CM | POA: Insufficient documentation

## 2015-11-23 MED ORDER — OXYCODONE-ACETAMINOPHEN 5-325 MG PO TABS: 2.0000 | ORAL_TABLET | ORAL | Status: DC | PRN

## 2015-11-23 MED ORDER — CYCLOBENZAPRINE HCL 10 MG PO TABS
10.0000 mg | ORAL_TABLET | Freq: Once | ORAL | Status: AC
  Administered 2015-11-23: 10 mg via ORAL
  Filled 2015-11-23: qty 1

## 2015-11-23 MED ORDER — OXYCODONE-ACETAMINOPHEN 5-325 MG PO TABS
1.0000 | ORAL_TABLET | Freq: Once | ORAL | Status: AC
  Administered 2015-11-23: 1 via ORAL
  Filled 2015-11-23: qty 1

## 2015-11-23 NOTE — Discharge Instructions (Signed)

## 2015-11-23 NOTE — ED Notes (Signed)
Having chronic back pain that is worse today. Hope they can do something to help me with it tonight. Has an appointment on Tuesday with pain management.

## 2015-11-23 NOTE — ED Provider Notes (Signed)
CSN: [id number]     Arrival date & time 11/23/15  2215 History   First MD Initiated Contact with Patient 11/23/15 2231     Chief Complaint  Patient presents with  . Back Pain     (Consider location/radiation/quality/duration/timing/severity/associated sxs/prior Treatment) Patient is a 40 y.o. male presenting with back pain. The history is provided by the patient.  Back Pain Location:  Lumbar spine Quality:  Burning  Marvin W Merilynn FinlandRobertson is a 40 y.o. male who presents to the ED with chronic low back pain. He has been evaluated here for same x 2. He had an MRI 10/21/15 that showed a herniation of L5-S1. There was a consult with Neuro and he was to follow up in the office. The patient returned 11/04/15 and stated that he did not know he had a referral. He was once again treated for pain and encouraged to follow up with [facility] as instructed on the prior visit. Patient has not followed up and complains of continued pain. He states that he has an appointment with the pain management clinic in 3 days.   Patient states he has been feeling better after taking 600 mg of Ibuprofen every 8 hours and the course of steroids. He reports that he has been able to walk without severe pain down his leg like it was before. However,  today he had to shovel snow and tonight the pain has gotten worse again. He states that he just needs something to help him get some relief. He states that he did not have the money to go to see Neuro but his family has paid for the first visit to the pain management clinic and he has taken his records to them and they have accepted him as a patient.    Past Medical History  Diagnosis Date  . Back pain    Past Surgical History  Procedure Laterality Date  . Collarbone    . Orthopedic surgery    . Hand surgery     No family history on file. Social History  Substance Use Topics  . Smoking status: Current Some Day Smoker -- 0.50 packs/day    Types: Cigarettes  . Smokeless tobacco:  None  . Alcohol Use: No    Review of Systems  Musculoskeletal: Positive for back pain.  all other systems negative    Allergies  Penicillins; Bee venom; and Xylocaine dental  Home Medications   Prior to Admission medications   Medication Sig Start Date End Date Taking? Authorizing Provider  ibuprofen (ADVIL,MOTRIN) 200 MG tablet Take 600 mg by mouth 2 (two) times daily as needed for headache, mild pain or moderate pain.    Yes Historical Provider, MD  cyclobenzaprine (FLEXERIL) 10 MG tablet Take 1 tablet (10 mg total) by mouth 3 (three) times daily as needed. Patient not taking: Reported on 11/23/2015 11/04/15   [name] Triplett, PA-C  dexamethasone (DECADRON) 6 MG tablet Take 1 tablet (6 mg total) by mouth 2 (two) times daily with a meal. Patient not taking: Reported on 11/23/2015 10/21/15   Ivery QualeHobson Bryant, PA-C  diazepam (VALIUM) 5 MG tablet Take 1 tablet (5 mg total) by mouth 3 (three) times daily. Patient not taking: Reported on 11/23/2015 10/21/15   Ivery QualeHobson Bryant, PA-C  HYDROcodone-acetaminophen (NORCO/VICODIN) 5-325 MG tablet Take one-two tabs po q 4-6 hrs prn pain Patient not taking: Reported on 11/23/2015 11/04/15   [name] Triplett, PA-C  naproxen (NAPROSYN) 500 MG tablet Take 1 tablet (500 mg total) by mouth 2 (two) times  daily with a meal. Patient not taking: Reported on 10/21/2015 10/16/15   Elson Areas, PA-C  oxyCODONE-acetaminophen (PERCOCET/ROXICET) 5-325 MG tablet Take 2 tablets by mouth every 4 (four) hours as needed for severe pain. 11/23/15   Niam Nepomuceno Orlene Och, NP  predniSONE (DELTASONE) 10 MG tablet Take 6 tablets day one, 5 tablets day two, 4 tablets day three, 3 tablets day four, 2 tablets day five, then 1 tablet day six Patient not taking: Reported on 11/23/2015 11/04/15   [name] Triplett, PA-C   BP 130/75 mmHg  Pulse 76  Temp(Src) 98.8 F (37.1 C) (Oral)  Resp 20  Ht 6' (1.829 m)  Wt 114.306 kg  BMI 34.17 kg/m2  SpO2 95% Physical Exam  Constitutional: He is oriented to  person, place, and time. He appears well-developed and well-nourished. No distress.  HENT:  Head: Normocephalic and atraumatic.  Eyes: Conjunctivae and EOM are normal. Pupils are equal, round, and reactive to light.  Neck: Normal range of motion. Neck supple.  Cardiovascular: Normal rate and regular rhythm.   Pulmonary/Chest: Effort normal and breath sounds normal. No respiratory distress.  Abdominal: Soft. Bowel sounds are normal. There is no tenderness.  Musculoskeletal: Normal range of motion. He exhibits no edema.       Lumbar back: He exhibits tenderness. He exhibits normal range of motion, no deformity, no spasm and normal pulse.  Grips equal  Neurological: He is alert and oriented to person, place, and time. He has normal strength. No cranial nerve deficit or sensory deficit. Gait normal.  Reflex Scores:      Bicep reflexes are 2+ on the right side and 2+ on the left side.      Brachioradialis reflexes are 2+ on the right side and 2+ on the left side.      Patellar reflexes are 2+ on the right side and 2+ on the left side. Ambulatory without foot drag.   Skin: Skin is warm and dry.  Psychiatric: He has a normal mood and affect. His behavior is normal.  Nursing note and vitals reviewed.   ED Course  Procedures (  MDM  40 y.o. male with hx of herniated disc that was improving with treatment until he shoveled snow today. Stable for d/c without any red flags for immediate neuro consult. Treated with Percocet and Flexeril and he will go home, rest and follow up with pain management as scheduled in 3 days. He will return here as needed for worsening symptoms.   Final diagnoses:  Low back pain with right-sided sciatica, unspecified back pain laterality       Janne Napoleon, NP 11/23/15 2324  Marily Memos, MD 11/27/15 2316

## 2015-11-25 MED FILL — Oxycodone w/ Acetaminophen Tab 5-325 MG: ORAL | Qty: 6 | Status: AC

## 2016-01-14 ENCOUNTER — Encounter (HOSPITAL_COMMUNITY): Payer: Self-pay

## 2016-01-14 ENCOUNTER — Emergency Department (HOSPITAL_COMMUNITY)
Admission: EM | Admit: 2016-01-14 | Discharge: 2016-01-14 | Disposition: A | Discharge status: Home / Self Care | Payer: No Typology Code available for payment source | Attending: Emergency Medicine | Admitting: Emergency Medicine

## 2016-01-14 DIAGNOSIS — F17210 Nicotine dependence, cigarettes, uncomplicated: Secondary | ICD-10-CM | POA: Insufficient documentation

## 2016-01-14 DIAGNOSIS — G8929 Other chronic pain: Secondary | ICD-10-CM | POA: Insufficient documentation

## 2016-01-14 DIAGNOSIS — Z880 Allergy status to penicillin: Secondary | ICD-10-CM | POA: Insufficient documentation

## 2016-01-14 DIAGNOSIS — M549 Dorsalgia, unspecified: Secondary | ICD-10-CM

## 2016-01-14 DIAGNOSIS — M545 Low back pain: Secondary | ICD-10-CM | POA: Insufficient documentation

## 2016-01-14 MED ORDER — OXYCODONE-ACETAMINOPHEN 5-325 MG PO TABS: 1.0000 | ORAL_TABLET | ORAL | Status: DC | PRN

## 2016-01-14 MED ORDER — OXYCODONE-ACETAMINOPHEN 5-325 MG PO TABS
2.0000 | ORAL_TABLET | Freq: Once | ORAL | Status: AC
  Administered 2016-01-14: 2 via ORAL
  Filled 2016-01-14: qty 2

## 2016-01-14 NOTE — ED Provider Notes (Signed)
CSN: [id number]     Arrival date & time 01/14/16  1919 History   First MD Initiated Contact with Patient 01/14/16 1933     Chief Complaint  Patient presents with  . Back Pain     (Consider location/radiation/quality/duration/timing/severity/associated sxs/prior Treatment) The history is provided by the patient.   Marvin Schroeder is a 40 y.o. male  presenting with acute on chronic low back pain which has which has been severe for the past week.   Patient denies any new injury specifically.  There is radiation of pain but denies weakness in his right leg to the mid calf.  There has been no weakness or numbness in the lower extremities and no urinary or bowel retention or incontinence.  Patient does not have a history of cancer or IVDU.  He has struggled to obtain medical care for his back given lack of insurance and difficulty working secondary to pain.  He had an MRI 2 months ago which revealed a disk herniation at L5-S1 and was referred to Dr. Wynetta Emery.  He states he was unable to afford the appointment with him.  However, he has been accepted as a patient at [facility] and is anticipating a nerve conduction study by them.  He reports his family is willing to pay for his initial appointment with [facility] which is next week.  He is taking ibuprofen with minimal relief of pain.     Past Medical History  Diagnosis Date  . Back pain    Past Surgical History  Procedure Laterality Date  . Collarbone    . Orthopedic surgery    . Hand surgery     No family history on file. Social History  Substance Use Topics  . Smoking status: Current Some Day Smoker -- 0.50 packs/day    Types: Cigarettes  . Smokeless tobacco: None  . Alcohol Use: No    Review of Systems  Constitutional: Negative for fever.  Respiratory: Negative for shortness of breath.   Cardiovascular: Negative for chest pain and leg swelling.  Gastrointestinal: Negative for abdominal pain, constipation and abdominal  distention.  Genitourinary: Negative for dysuria, urgency, frequency, flank pain and difficulty urinating.  Musculoskeletal: Positive for back pain. Negative for joint swelling and gait problem.  Skin: Negative for rash.  Neurological: Negative for weakness and numbness.      Allergies  Penicillins; Bee venom; and Xylocaine dental  Home Medications   Prior to Admission medications   Medication Sig Start Date End Date Taking? Authorizing Provider  cyclobenzaprine (FLEXERIL) 10 MG tablet Take 1 tablet (10 mg total) by mouth 3 (three) times daily as needed. Patient not taking: Reported on 11/23/2015 11/04/15   [name] Triplett, PA-C  dexamethasone (DECADRON) 6 MG tablet Take 1 tablet (6 mg total) by mouth 2 (two) times daily with a meal. Patient not taking: Reported on 11/23/2015 10/21/15   Ivery Quale, PA-C  diazepam (VALIUM) 5 MG tablet Take 1 tablet (5 mg total) by mouth 3 (three) times daily. Patient not taking: Reported on 11/23/2015 10/21/15   Ivery Quale, PA-C  HYDROcodone-acetaminophen (NORCO/VICODIN) 5-325 MG tablet Take one-two tabs po q 4-6 hrs prn pain Patient not taking: Reported on 11/23/2015 11/04/15   [name] Triplett, PA-C  ibuprofen (ADVIL,MOTRIN) 200 MG tablet Take 600 mg by mouth 2 (two) times daily as needed for headache, mild pain or moderate pain.     Historical Provider, MD  naproxen (NAPROSYN) 500 MG tablet Take 1 tablet (500 mg total) by mouth 2 (  two) times daily with a meal. Patient not taking: Reported on 10/21/2015 10/16/15   Elson Areas, PA-C  oxyCODONE-acetaminophen (PERCOCET/ROXICET) 5-325 MG tablet Take 1 tablet by mouth every 4 (four) hours as needed. 01/14/16   Burgess Amor, PA-C  predniSONE (DELTASONE) 10 MG tablet Take 6 tablets day one, 5 tablets day two, 4 tablets day three, 3 tablets day four, 2 tablets day five, then 1 tablet day six Patient not taking: Reported on 11/23/2015 11/04/15   [name] Triplett, PA-C   BP 149/75 mmHg  Temp(Src) 98.3 F (36.8 C) (Oral)   Resp 18  Ht 6' (1.829 m)  Wt 114.306 kg  BMI 34.17 kg/m2  SpO2 97% Physical Exam  Constitutional: He appears well-developed and well-nourished.  HENT:  Head: Normocephalic.  Eyes: Conjunctivae are normal.  Neck: Normal range of motion. Neck supple.  Cardiovascular: Normal rate and intact distal pulses.   Pedal pulses normal.  Pulmonary/Chest: Effort normal.  Abdominal: Soft. Bowel sounds are normal. He exhibits no distension and no mass.  Musculoskeletal: Normal range of motion. He exhibits no edema.       Lumbar back: He exhibits tenderness. He exhibits no swelling, no edema and no spasm.  Neurological: He is alert. He has normal strength. He displays no atrophy and no tremor. No sensory deficit. Gait normal.  Reflex Scores:      Patellar reflexes are 2+ on the right side and 2+ on the left side.      Achilles reflexes are 2+ on the right side and 2+ on the left side. No strength deficit noted in hip and knee flexor and extensor muscle groups.  Ankle flexion and extension intact. Normal gait.  Skin: Skin is warm and dry.  Psychiatric: He has a normal mood and affect.  Nursing note and vitals reviewed.   ED Course  Procedures (including critical care time) Labs Review Labs Reviewed - No data to display  Imaging Review No results found. I have personally reviewed and evaluated these images and lab results as part of my medical decision-making.   EKG Interpretation None      MDM   Final diagnoses:  Chronic back pain    [place] controlled substance database reviewed. No controlled substances listed except as prescribed by this ed.  Discussed chronic pain tx in the ed, advising pt I will cover him until he can establish with [facility] next week but will not be able to cover him beyond.  Pt understands plan.  Oxycodone prescribed. He is taking otc ibuprofen, advised to continue this med.    No neuro deficit on exam or by history to suggest emergent or surgical  presentation.  Also discussed worsened sx that should prompt immediate re-evaluation including distal weakness, bowel/bladder retention/incontinence.          Burgess Amor, PA-C 01/15/16 1138  Vanetta Mulders, MD 01/17/16 [phone]
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
## 2016-01-14 NOTE — Discharge Instructions (Signed)

## 2016-01-25 ENCOUNTER — Emergency Department (HOSPITAL_COMMUNITY): Payer: No Typology Code available for payment source

## 2016-01-25 ENCOUNTER — Encounter (HOSPITAL_COMMUNITY): Payer: Self-pay

## 2016-01-25 ENCOUNTER — Emergency Department (HOSPITAL_COMMUNITY)
Admission: EM | Admit: 2016-01-25 | Discharge: 2016-01-25 | Disposition: A | Discharge status: Home / Self Care | Payer: No Typology Code available for payment source | Attending: Emergency Medicine | Admitting: Emergency Medicine

## 2016-01-25 DIAGNOSIS — S40011A Contusion of right shoulder, initial encounter: Secondary | ICD-10-CM | POA: Insufficient documentation

## 2016-01-25 DIAGNOSIS — Y929 Unspecified place or not applicable: Secondary | ICD-10-CM | POA: Diagnosis not present

## 2016-01-25 DIAGNOSIS — Y999 Unspecified external cause status: Secondary | ICD-10-CM | POA: Diagnosis not present

## 2016-01-25 DIAGNOSIS — Y939 Activity, unspecified: Secondary | ICD-10-CM | POA: Diagnosis not present

## 2016-01-25 DIAGNOSIS — S46901A Unspecified injury of unspecified muscle, fascia and tendon at shoulder and upper arm level, right arm, initial encounter: Secondary | ICD-10-CM | POA: Diagnosis present

## 2016-01-25 DIAGNOSIS — F17210 Nicotine dependence, cigarettes, uncomplicated: Secondary | ICD-10-CM | POA: Diagnosis not present

## 2016-01-25 LAB — CBC WITH DIFFERENTIAL/PLATELET
Basophils Absolute: 0.1 10*3/uL (ref 0.0–0.1)
Basophils Relative: 1 %
EOS ABS: 0.2 10*3/uL (ref 0.0–0.7)
EOS PCT: 2 %
HCT: 44.7 % (ref 39.0–52.0)
Hemoglobin: 15.1 g/dL (ref 13.0–17.0)
LYMPHS ABS: 1 10*3/uL (ref 0.7–4.0)
LYMPHS PCT: 15 %
MCH: 30.8 pg (ref 26.0–34.0)
MCHC: 33.8 g/dL (ref 30.0–36.0)
MCV: 91.2 fL (ref 78.0–100.0)
MONO ABS: 1.3 10*3/uL — AB (ref 0.1–1.0)
Monocytes Relative: 19 %
Neutro Abs: 4.1 10*3/uL (ref 1.7–7.7)
Neutrophils Relative %: 63 %
PLATELETS: 244 10*3/uL (ref 150–400)
RBC: 4.9 MIL/uL (ref 4.22–5.81)
RDW: 12.8 % (ref 11.5–15.5)
WBC: 6.6 10*3/uL (ref 4.0–10.5)

## 2016-01-25 LAB — COMPREHENSIVE METABOLIC PANEL
ALBUMIN: 4.3 g/dL (ref 3.5–5.0)
ALT: 33 U/L (ref 17–63)
AST: 30 U/L (ref 15–41)
Alkaline Phosphatase: 63 U/L (ref 38–126)
Anion gap: 8 (ref 5–15)
BILIRUBIN TOTAL: 0.6 mg/dL (ref 0.3–1.2)
BUN: 16 mg/dL (ref 6–20)
CHLORIDE: 104 mmol/L (ref 101–111)
CO2: 25 mmol/L (ref 22–32)
CREATININE: 1.1 mg/dL (ref 0.61–1.24)
Calcium: 8.9 mg/dL (ref 8.9–10.3)
GFR calc Af Amer: >60 mL/min (ref >60)
GLUCOSE: 121 mg/dL — AB (ref 65–99)
POTASSIUM: 4.1 mmol/L (ref 3.5–5.1)
Sodium: 137 mmol/L (ref 135–145)
TOTAL PROTEIN: 7.3 g/dL (ref 6.5–8.1)

## 2016-01-25 MED ORDER — IOHEXOL 300 MG/ML  SOLN
100.0000 mL | Freq: Once | INTRAMUSCULAR | Status: AC | PRN
  Administered 2016-01-25: 100 mL via INTRAVENOUS

## 2016-01-25 MED ORDER — FENTANYL CITRATE (PF) 100 MCG/2ML IJ SOLN
100.0000 ug | Freq: Once | INTRAMUSCULAR | Status: AC
Start: 2016-01-25 — End: 2016-01-25
  Administered 2016-01-25: 100 ug via INTRAVENOUS
  Filled 2016-01-25: qty 2

## 2016-01-25 MED ORDER — METHOCARBAMOL 500 MG PO TABS: 500.0000 mg | ORAL_TABLET | Freq: Three times a day (TID) | ORAL | Status: DC | PRN

## 2016-01-25 MED ORDER — IBUPROFEN 600 MG PO TABS: 600.0000 mg | ORAL_TABLET | Freq: Three times a day (TID) | ORAL | Status: DC | PRN

## 2016-01-25 MED ORDER — SODIUM CHLORIDE 0.9 % IV BOLUS (SEPSIS)
500.0000 mL | Freq: Once | INTRAVENOUS | Status: AC
  Administered 2016-01-25: 500 mL via INTRAVENOUS
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
## 2016-01-25 NOTE — Discharge Instructions (Signed)

## 2016-04-13 ENCOUNTER — Encounter (HOSPITAL_COMMUNITY): Payer: Self-pay | Admitting: *Deleted

## 2016-04-13 ENCOUNTER — Emergency Department (HOSPITAL_COMMUNITY)
Admission: EM | Admit: 2016-04-13 | Discharge: 2016-04-13 | Disposition: A | Discharge status: Home / Self Care | Payer: No Typology Code available for payment source | Attending: Emergency Medicine | Admitting: Emergency Medicine

## 2016-04-13 DIAGNOSIS — M545 Low back pain: Secondary | ICD-10-CM | POA: Insufficient documentation

## 2016-04-13 DIAGNOSIS — M549 Dorsalgia, unspecified: Secondary | ICD-10-CM

## 2016-04-13 DIAGNOSIS — F17210 Nicotine dependence, cigarettes, uncomplicated: Secondary | ICD-10-CM | POA: Insufficient documentation

## 2016-04-13 DIAGNOSIS — G8929 Other chronic pain: Secondary | ICD-10-CM

## 2016-04-13 DIAGNOSIS — Z791 Long term (current) use of non-steroidal anti-inflammatories (NSAID): Secondary | ICD-10-CM | POA: Insufficient documentation

## 2016-04-13 MED ORDER — IBUPROFEN 600 MG PO TABS: 600.0000 mg | ORAL_TABLET | Freq: Three times a day (TID) | ORAL | Status: DC | PRN

## 2016-04-13 MED ORDER — DIAZEPAM 5 MG PO TABS
5.0000 mg | ORAL_TABLET | Freq: Once | ORAL | Status: AC
  Administered 2016-04-13: 5 mg via ORAL
  Filled 2016-04-13: qty 1

## 2016-04-13 MED ORDER — METHOCARBAMOL 500 MG PO TABS: 500.0000 mg | ORAL_TABLET | Freq: Three times a day (TID) | ORAL | Status: DC | PRN

## 2016-04-13 MED ORDER — HYDROCODONE-ACETAMINOPHEN 5-325 MG PO TABS
2.0000 | ORAL_TABLET | Freq: Once | ORAL | Status: AC
  Administered 2016-04-13: 2 via ORAL
  Filled 2016-04-13: qty 2

## 2016-04-13 MED ORDER — NAPROXEN 250 MG PO TABS
500.0000 mg | ORAL_TABLET | Freq: Once | ORAL | Status: AC
  Administered 2016-04-13: 500 mg via ORAL
  Filled 2016-04-13: qty 2

## 2016-04-13 MED ORDER — HYDROCODONE-ACETAMINOPHEN 5-325 MG PO TABS: 1.0000 | ORAL_TABLET | Freq: Four times a day (QID) | ORAL | Status: DC | PRN

## 2016-04-13 NOTE — Discharge Instructions (Signed)

## 2016-04-13 NOTE — ED Notes (Signed)
Pt c/o back pain

## 2016-04-13 NOTE — ED Provider Notes (Signed)
CSN: [id number]     Arrival date & time 04/13/16  0037 History   First MD Initiated Contact with Patient 04/13/16 0243     Chief Complaint  Patient presents with  . Back Pain     (Consider location/radiation/quality/duration/timing/severity/associated sxs/prior Treatment) HPI Comments: Pt comes in with cc of back pain. Pain starts in the lower bACK and radiating to the R side, buttock. There is no n/v/f/c. No associated numbness, weakness, urinary incontinence, urinary retention, bowel incontinence, saddle anesthesia.  Pt gets flair up every few months. Pt cant think of any specific aggravating or relieving factors and nothing that evoked the pain. Pain is worse with certain position.      Patient is a 40 y.o. male presenting with back pain. The history is provided by the patient.  Back Pain Associated symptoms: no numbness     Past Medical History  Diagnosis Date  . Back pain    Past Surgical History  Procedure Laterality Date  . Collarbone    . Orthopedic surgery    . Hand surgery     History reviewed. No pertinent family history. Social History  Substance Use Topics  . Smoking status: Current Some Day Smoker -- 0.50 packs/day    Types: Cigarettes  . Smokeless tobacco: None  . Alcohol Use: No    Review of Systems  Constitutional: Positive for activity change.  Musculoskeletal: Positive for back pain.  Skin: Negative for rash and wound.  Neurological: Negative for numbness.      Allergies  Penicillins; Bee venom; and Xylocaine dental  Home Medications   Prior to Admission medications   Medication Sig Start Date End Date Taking? Authorizing Provider  HYDROcodone-acetaminophen (NORCO/VICODIN) 5-325 MG tablet Take 1 tablet by mouth every 6 (six) hours as needed for moderate pain. 04/13/16   Derwood KaplanAnkit Dayne Dekay, MD  ibuprofen (ADVIL,MOTRIN) 600 MG tablet Take 1 tablet (600 mg total) by mouth every 8 (eight) hours as needed. 04/13/16   Derwood KaplanAnkit Ayline Dingus, MD   methocarbamol (ROBAXIN) 500 MG tablet Take 1 tablet (500 mg total) by mouth every 8 (eight) hours as needed for muscle spasms. 04/13/16   Jay Haskew, MD   BP 139/90 mmHg  Pulse 76  Temp(Src) 98.9 F (37.2 C) (Oral)  Resp 18  Ht 6' (1.829 m)  Wt 235 lb (106.595 kg)  BMI 31.86 kg/m2  SpO2 98% Physical Exam  Constitutional: He is oriented to person, place, and time. He appears well-developed.  HENT:  Head: Atraumatic.  Neck: Neck supple.  Cardiovascular: Normal rate.   Pulmonary/Chest: Effort normal.  Musculoskeletal:  Pt has tenderness over the lumbar region No step offs, no erythema. Pt has 2+ patellar reflex bilaterally. Able to discriminate between sharp and dull. Able to ambulate   Neurological: He is alert and oriented to person, place, and time.  Skin: Skin is warm.  Nursing note and vitals reviewed.   ED Course  Procedures (including critical care time) Labs Review Labs Reviewed - No data to display  Imaging Review No results found. I have personally reviewed and evaluated these images and lab results as part of my medical decision-making.   EKG Interpretation None      MDM   Final diagnoses:  Chronic back pain    Pt comes in with cc of back pain. No red flags for cord involvement - he is having some radiation and numbness to the buttock region - so impingement is involved to some extent. We will give meds in the ER and  d/c with 2 norco. Pt is to see pain specialist this week.   Derwood Kaplan, MD 04/13/16 [phone]

## 2016-04-13 NOTE — ED Notes (Signed)
Pt with complaint of chronic back pain who presents with low back pain into the right hip.  He denies having a ortho, or chronic pain physician. He ambulates heel to toe. Wife at bedside

## 2016-04-13 NOTE — ED Notes (Signed)
Pt and SO upset that they are only given 2 vicodin on their prescriptions saying that the pharmacies are particular about the number of meds- They are reassured by this RN that the pharmacy will call this ED should they think there is a problem. Pt ambulated to exit heel to toe without further concerns

## 2016-04-13 NOTE — ED Notes (Signed)
Pt SO to nurses station asking for pain meds for pt. Explained that physician has to order. Pt reclining on stretcher with eyes closed

## 2016-04-26 DIAGNOSIS — Y999 Unspecified external cause status: Secondary | ICD-10-CM | POA: Insufficient documentation

## 2016-04-26 DIAGNOSIS — Z79899 Other long term (current) drug therapy: Secondary | ICD-10-CM | POA: Insufficient documentation

## 2016-04-26 DIAGNOSIS — S93602A Unspecified sprain of left foot, initial encounter: Secondary | ICD-10-CM | POA: Insufficient documentation

## 2016-04-26 DIAGNOSIS — X501XXA Overexertion from prolonged static or awkward postures, initial encounter: Secondary | ICD-10-CM | POA: Insufficient documentation

## 2016-04-26 DIAGNOSIS — Y929 Unspecified place or not applicable: Secondary | ICD-10-CM | POA: Insufficient documentation

## 2016-04-26 DIAGNOSIS — Y9301 Activity, walking, marching and hiking: Secondary | ICD-10-CM | POA: Insufficient documentation

## 2016-04-26 DIAGNOSIS — F17210 Nicotine dependence, cigarettes, uncomplicated: Secondary | ICD-10-CM | POA: Insufficient documentation

## 2016-04-27 ENCOUNTER — Emergency Department (HOSPITAL_COMMUNITY): Payer: Self-pay

## 2016-04-27 ENCOUNTER — Emergency Department (HOSPITAL_COMMUNITY)
Admission: EM | Admit: 2016-04-27 | Discharge: 2016-04-27 | Disposition: A | Discharge status: Home / Self Care | Payer: Self-pay | Attending: Emergency Medicine | Admitting: Emergency Medicine

## 2016-04-27 ENCOUNTER — Encounter (HOSPITAL_COMMUNITY): Payer: Self-pay | Admitting: *Deleted

## 2016-04-27 DIAGNOSIS — S93602A Unspecified sprain of left foot, initial encounter: Secondary | ICD-10-CM

## 2016-04-27 MED ORDER — HYDROCODONE-ACETAMINOPHEN 5-325 MG PO TABS: 1.0000 | ORAL_TABLET | Freq: Four times a day (QID) | ORAL | Status: DC | PRN

## 2016-04-27 MED ORDER — IBUPROFEN 800 MG PO TABS: 800.0000 mg | ORAL_TABLET | Freq: Three times a day (TID) | ORAL | Status: DC | PRN

## 2016-04-27 MED ORDER — IBUPROFEN 800 MG PO TABS
800.0000 mg | ORAL_TABLET | Freq: Once | ORAL | Status: AC
Start: 2016-04-27 — End: 2016-04-27
  Administered 2016-04-27: 800 mg via ORAL
  Filled 2016-04-27: qty 1

## 2016-04-27 MED ORDER — HYDROCODONE-ACETAMINOPHEN 5-325 MG PO TABS
2.0000 | ORAL_TABLET | Freq: Once | ORAL | Status: AC
  Administered 2016-04-27: 2 via ORAL
  Filled 2016-04-27: qty 2

## 2016-04-27 NOTE — ED Notes (Signed)
Pt states that he felt a pop in his left foot yesterday, denies any injury, states that he has had problems with his foot in the past,

## 2016-04-27 NOTE — ED Provider Notes (Signed)
TIME SEEN: 2:00 AM  CHIEF COMPLAINT: Left foot injury  HPI: Pt is a 40 y.o. male with history of chronic back pain who presents to the emergency department with complaints of left foot pain. He states he was walking and felt a pop. Has swelling and ecchymosis to the left lateral foot. No other injury. Denies numbness or focal weakness. States he is able to ambulate but does so with difficulty. States he has crutches at home.  ROS: See HPI Constitutional: no fever  Eyes: no drainage  ENT: no runny nose   Cardiovascular:  no chest pain  Resp: no SOB  GI: no vomiting GU: no dysuria Integumentary: no rash  Allergy: no hives  Musculoskeletal: Left foot pain and swelling Neurological: no slurred speech ROS otherwise negative  PAST MEDICAL HISTORY/PAST SURGICAL HISTORY:  Past Medical History  Diagnosis Date  . Back pain     MEDICATIONS:  Prior to Admission medications   Medication Sig Start Date End Date Taking? Authorizing Provider  ibuprofen (ADVIL,MOTRIN) 600 MG tablet Take 1 tablet (600 mg total) by mouth every 8 (eight) hours as needed. 04/13/16  Yes Derwood Kaplan, MD  methocarbamol (ROBAXIN) 500 MG tablet Take 1 tablet (500 mg total) by mouth every 8 (eight) hours as needed for muscle spasms. 04/13/16  Yes Derwood Kaplan, MD  HYDROcodone-acetaminophen (NORCO/VICODIN) 5-325 MG tablet Take 1 tablet by mouth every 6 (six) hours as needed for moderate pain. 04/13/16   Derwood Kaplan, MD    ALLERGIES:  Allergies  Allergen Reactions  . Penicillins Anaphylaxis    Has patient had a PCN reaction causing immediate rash, facial/tongue/throat swelling, SOB or lightheadedness with hypotension: Yes Has patient had a PCN reaction causing severe rash involving mucus membranes or skin necrosis: Yes Has patient had a PCN reaction that required hospitalization No Has patient had a PCN reaction occurring within the last 10 years: unknown If all of the above answers are "NO", then may proceed with  Cephalosporin use.   . Bee Venom Hives  . Xylocaine Dental [Lidocaine-Epinephrine] Itching, Swelling and Rash    Numbing agent used on 08/07/13 consisted of Xylocaine/Epinephrine resulted in hives, swelling, itching (including of the throat).    SOCIAL HISTORY:  Social History  Substance Use Topics  . Smoking status: Current Some Day Smoker -- 0.50 packs/day    Types: Cigarettes  . Smokeless tobacco: Not on file  . Alcohol Use: No    FAMILY HISTORY: No family history on file.  EXAM: BP 126/74 mmHg  Pulse 84  Temp(Src) 98.3 F (36.8 C) (Oral)  Resp 20  Ht 6' (1.829 m)  Wt 232 lb (105.235 kg)  BMI 31.46 kg/m2  SpO2 100% CONSTITUTIONAL: Alert and oriented and responds appropriately to questions. Well-appearing; well-nourished HEAD: Normocephalic EYES: Conjunctivae clear, PERRL ENT: normal nose; no rhinorrhea; moist mucous membranes NECK: Supple, no meningismus, no LAD  CARD: RRR; S1 and S2 appreciated; no murmurs, no clicks, no rubs, no gallops RESP: Normal chest excursion without splinting or tachypnea; breath sounds clear and equal bilaterally; no wheezes, no rhonchi, no rales, no hypoxia or respiratory distress, speaking full sentences ABD/GI: Normal bowel sounds; non-distended; soft, non-tender, no rebound, no guarding, no peritoneal signs BACK:  The back appears normal and is non-tender to palpation, there is no CVA tenderness EXT: Patient is tender to palpation over the left lateral foot with associated swelling and ecchymosis. No bony deformity. No tenderness over the left ankle, left knee, left hip. No calf tenderness or swelling on  exam. Compartments are soft. No ligamentous laxity. 2+ DP pulses bilaterally. Normal range of motion in all joints. Otherwise extremities are nontender to palpation. No edema noted. Normal capillary refill; no cyanosis SKIN: Normal color for age and race; warm; no rash NEURO: Moves all extremities equally, sensation to light touch intact  diffusely, cranial nerves II through XII intact PSYCH: The patient's mood and manner are appropriate. Grooming and personal hygiene are appropriate.  MEDICAL DECISION MAKING: Patient here with left foot injury. X-ray shows no fracture. Have advised him to keep his foot elevated, apply ice. Will prescribe a short course of pain medication. He does appear to have a sprain as he does have swelling and ecchymosis in this area. No erythema or warmth. No calf tenderness or swelling. Neurovascularly intact distally. Compartments are soft. No joint effusions noted. We'll get outpatient orthopedic follow-up information. Offered work note the patient declines. States he has crutches at home. Discussed return precautions. He verbalizes understanding and is comfortable with this plan.   At this time, I do not feel there is any life-threatening condition present. I have reviewed and discussed all results (EKG, imaging, lab, urine as appropriate), exam findings with patient. I have reviewed nursing notes and appropriate previous records.  I feel the patient is safe to be discharged home without further emergent workup. Discussed usual and customary return precautions. Patient and family (if present) verbalize understanding and are comfortable with this plan.  Patient will follow-up with their primary care provider. If they do not have a primary care provider, information for follow-up has been provided to them. All questions have been answered.        Marvin MawKristen N Ward, DO 04/27/16 [phone]

## 2016-04-27 NOTE — Discharge Instructions (Signed)
Foot Sprain °A foot sprain is an injury to one of the strong bands of tissue (ligaments) that connect and support the many bones in your feet. The ligament can be stretched too much or it can tear. A tear can be either partial or complete. The severity of the sprain depends on how much of the ligament was damaged or torn. °CAUSES °A foot sprain is usually caused by suddenly twisting or pivoting your foot. °RISK FACTORS °This injury is more likely to occur in people who: °· Play a sport, such as basketball or football. °· Exercise or play a sport without warming up. °· Start a new workout or sport. °· Suddenly increase how long or hard they exercise or play a sport. °SYMPTOMS °Symptoms of this condition start soon after an injury and include: °· Pain, especially in the arch of the foot. °· Bruising. °· Swelling. °· Inability to walk or use the foot to support body weight. °DIAGNOSIS °This condition is diagnosed with a medical history and physical exam. You may also have imaging tests, such as: °· X-rays to make sure there are no broken bones (fractures). °· MRI to see if the ligament has torn. °TREATMENT °Treatment varies depending on the severity of your sprain. Mild sprains can be treated with rest, ice, compression, and elevation (RICE). If your ligament is overstretched or partially torn, treatment usually involves keeping your foot in a fixed position (immobilization) for a period of time. To help you do this, your health care provider will apply a bandage, splint, or walking boot to keep your foot from moving until it heals. You may also be advised to use crutches or a scooter for a few weeks to avoid bearing weight on your foot while it is healing. °If your ligament is fully torn, you may need surgery to reconnect the ligament to the bone. After surgery, a cast or splint will be applied and will need to stay on your foot while it heals. °Your health care provider may also suggest exercises or physical therapy  to strengthen your foot. °HOME CARE INSTRUCTIONS °If You Have a Bandage, Splint, or Walking Boot: °· Wear it as directed by your health care provider. Remove it only as directed by your health care provider. °· Loosen the bandage, splint, or walking boot if your toes become numb and tingle, or if they turn cold and blue. °Bathing °· If your health care provider approves bathing and showering, cover the bandage or splint with a watertight plastic bag to protect it from water. Do not let the bandage or splint get wet. °Managing Pain, Stiffness, and Swelling  °· If directed, apply ice to the injured area: °¨ Put ice in a plastic bag. °¨ Place a towel between your skin and the bag. °¨ Leave the ice on for 20 minutes, 2-3 times per day. °· Move your toes often to avoid stiffness and to lessen swelling. °· Raise (elevate) the injured area above the level of your heart while you are sitting or lying down. °Driving °· Do not drive or operate heavy machinery while taking pain medicine. °· Do not drive while wearing a bandage, splint, or walking boot on a foot that you use for driving. °Activity °· Rest as directed by your health care provider. °· Do not use the injured foot to support your body weight until your health care provider says that you can. Use crutches or other supportive devices as directed by your health care provider. °· Ask your health care   provider what activities are safe for you. Gradually increase how much and how far you walk until your health care provider says it is safe to return to full activity.  Do any exercise or physical therapy as directed by your health care provider. General Instructions  If a splint was applied, do not put pressure on any part of it until it is fully hardened. This may take several hours.  Take medicines only as directed by your health care provider. These include over-the-counter medicines and prescription medicines.  Keep all follow-up visits as directed by your  health care provider. This is important.  When you can walk without pain, wear supportive shoes that have stiff soles. Do not wear flip-flops, and do not walk barefoot. SEEK MEDICAL CARE IF:  Your pain is not controlled with medicine.  Your bruising or swelling gets worse or does not get better with treatment.  Your splint or walking boot is damaged. SEEK IMMEDIATE MEDICAL CARE IF:  Your foot is numb or blue.  Your foot feels colder than normal.   This information is not intended to replace advice given to you by your health care provider. Make sure you discuss any questions you have with your health care provider.   Document Released: 04/24/2002 Document Revised: 03/19/2015 Document Reviewed: 09/05/2014 Elsevier Interactive Patient Education 2016 [employer]   RICE for Routine Care of Injuries Theroutine careofmanyinjuriesincludes rest, ice, compression, and elevation (RICE therapy). RICE therapy is often recommended for injuries to soft tissues, such as a muscle strain, ligament injuries, bruises, and overuse injuries. It can also be used for some bony injuries. Using RICE therapy can help to relieve pain, lessen swelling, and enable your body to heal. Rest Rest is required to allow your body to heal. This usually involves reducing your normal activities and avoiding use of the injured part of your body. Generally, you can return to your normal activities when you are comfortable and have been given permission by your health care provider. Ice Icing your injury helps to keep the swelling down, and it lessens pain. Do not apply ice directly to your skin.  Put ice in a plastic bag.  Place a towel between your skin and the bag.  Leave the ice on for 20 minutes, 2-3 times a day. Do this for as long as you are directed by your health care provider. Compression Compression means putting pressure on the injured area. Compression helps to keep swelling down, gives support, and  helps with discomfort. Compression may be done with an elastic bandage. If an elastic bandage has been applied, follow these general tips:  Remove and reapply the bandage every 3-4 hours or as directed by your health care provider.  Make sure the bandage is not wrapped too tightly, because this can cut off circulation. If part of your body beyond the bandage becomes blue, numb, cold, swollen, or more painful, your bandage is most likely too tight. If this occurs, remove your bandage and reapply it more loosely.  See your health care provider if the bandage seems to be making your problems worse rather than better. Elevation Elevation means keeping the injured area raised. This helps to lessen swelling and decrease pain. If possible, your injured area should be elevated at or above the level of your heart or the center of your chest. WHEN SHOULD I SEEK MEDICAL CARE? You should seek medical care if:  Your pain and swelling continue.  Your symptoms are getting worse rather than improving. These  symptoms may indicate that further evaluation or further X-rays are needed. Sometimes, X-rays may not show a small broken bone (fracture) until a number of days later. Make a follow-up appointment with your health care provider. WHEN SHOULD I SEEK IMMEDIATE MEDICAL CARE? You should seek immediate medical care if:  You have sudden severe pain at or below the area of your injury.  You have redness or increased swelling around your injury.  You have tingling or numbness at or below the area of your injury that does not improve after you remove the elastic bandage.   This information is not intended to replace advice given to you by your health care provider. Make sure you discuss any questions you have with your health care provider.   Document Released: 02/14/2001 Document Revised: 07/24/2015 Document Reviewed: 10/10/2014 Elsevier Interactive Patient Education [employer]

## 2016-04-27 NOTE — ED Notes (Signed)
Pt refusing to have foot wrapped. Pt requesting to have his LPN fiance at home do it.

## 2016-04-30 ENCOUNTER — Encounter (HOSPITAL_COMMUNITY): Payer: Self-pay

## 2016-04-30 ENCOUNTER — Emergency Department (HOSPITAL_COMMUNITY)
Admission: EM | Admit: 2016-04-30 | Discharge: 2016-04-30 | Disposition: A | Discharge status: Home / Self Care | Payer: No Typology Code available for payment source | Attending: Emergency Medicine | Admitting: Emergency Medicine

## 2016-04-30 DIAGNOSIS — X501XXD Overexertion from prolonged static or awkward postures, subsequent encounter: Secondary | ICD-10-CM | POA: Insufficient documentation

## 2016-04-30 DIAGNOSIS — S93602D Unspecified sprain of left foot, subsequent encounter: Secondary | ICD-10-CM | POA: Insufficient documentation

## 2016-04-30 DIAGNOSIS — F17210 Nicotine dependence, cigarettes, uncomplicated: Secondary | ICD-10-CM | POA: Insufficient documentation

## 2016-04-30 DIAGNOSIS — Z791 Long term (current) use of non-steroidal anti-inflammatories (NSAID): Secondary | ICD-10-CM | POA: Insufficient documentation

## 2016-04-30 MED ORDER — OXYCODONE-ACETAMINOPHEN 5-325 MG PO TABS: 1.0000 | ORAL_TABLET | ORAL | Status: DC | PRN

## 2016-04-30 MED ORDER — OXYCODONE-ACETAMINOPHEN 5-325 MG PO TABS
2.0000 | ORAL_TABLET | Freq: Once | ORAL | Status: AC
  Administered 2016-04-30: 2 via ORAL
  Filled 2016-04-30: qty 2
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
## 2016-04-30 NOTE — Discharge Instructions (Signed)
Foot Sprain °A foot sprain is an injury to one of the strong bands of tissue (ligaments) that connect and support the many bones in your feet. The ligament can be stretched too much or it can tear. A tear can be either partial or complete. The severity of the sprain depends on how much of the ligament was damaged or torn. °CAUSES °A foot sprain is usually caused by suddenly twisting or pivoting your foot. °RISK FACTORS °This injury is more likely to occur in people who: °· Play a sport, such as basketball or football. °· Exercise or play a sport without warming up. °· Start a new workout or sport. °· Suddenly increase how long or hard they exercise or play a sport. °SYMPTOMS °Symptoms of this condition start soon after an injury and include: °· Pain, especially in the arch of the foot. °· Bruising. °· Swelling. °· Inability to walk or use the foot to support body weight. °DIAGNOSIS °This condition is diagnosed with a medical history and physical exam. You may also have imaging tests, such as: °· X-rays to make sure there are no broken bones (fractures). °· MRI to see if the ligament has torn. °TREATMENT °Treatment varies depending on the severity of your sprain. Mild sprains can be treated with rest, ice, compression, and elevation (RICE). If your ligament is overstretched or partially torn, treatment usually involves keeping your foot in a fixed position (immobilization) for a period of time. To help you do this, your health care provider will apply a bandage, splint, or walking boot to keep your foot from moving until it heals. You may also be advised to use crutches or a scooter for a few weeks to avoid bearing weight on your foot while it is healing. °If your ligament is fully torn, you may need surgery to reconnect the ligament to the bone. After surgery, a cast or splint will be applied and will need to stay on your foot while it heals. °Your health care provider may also suggest exercises or physical therapy  to strengthen your foot. °HOME CARE INSTRUCTIONS °If You Have a Bandage, Splint, or Walking Boot: °· Wear it as directed by your health care provider. Remove it only as directed by your health care provider. °· Loosen the bandage, splint, or walking boot if your toes become numb and tingle, or if they turn cold and blue. °Bathing °· If your health care provider approves bathing and showering, cover the bandage or splint with a watertight plastic bag to protect it from water. Do not let the bandage or splint get wet. °Managing Pain, Stiffness, and Swelling  °· If directed, apply ice to the injured area: °¨ Put ice in a plastic bag. °¨ Place a towel between your skin and the bag. °¨ Leave the ice on for 20 minutes, 2-3 times per day. °· Move your toes often to avoid stiffness and to lessen swelling. °· Raise (elevate) the injured area above the level of your heart while you are sitting or lying down. °Driving °· Do not drive or operate heavy machinery while taking pain medicine. °· Do not drive while wearing a bandage, splint, or walking boot on a foot that you use for driving. °Activity °· Rest as directed by your health care provider. °· Do not use the injured foot to support your body weight until your health care provider says that you can. Use crutches or other supportive devices as directed by your health care provider. °· Ask your health care   provider what activities are safe for you. Gradually increase how much and how far you walk until your health care provider says it is safe to return to full activity. °· Do any exercise or physical therapy as directed by your health care provider. °General Instructions °· If a splint was applied, do not put pressure on any part of it until it is fully hardened. This may take several hours. °· Take medicines only as directed by your health care provider. These include over-the-counter medicines and prescription medicines. °· Keep all follow-up visits as directed by your  health care provider. This is important. °· When you can walk without pain, wear supportive shoes that have stiff soles. Do not wear flip-flops, and do not walk barefoot. °SEEK MEDICAL CARE IF: °· Your pain is not controlled with medicine. °· Your bruising or swelling gets worse or does not get better with treatment. °· Your splint or walking boot is damaged. °SEEK IMMEDIATE MEDICAL CARE IF: °· Your foot is numb or blue. °· Your foot feels colder than normal. °  °This information is not intended to replace advice given to you by your health care provider. Make sure you discuss any questions you have with your health care provider. °  °Document Released: 04/24/2002 Document Revised: 03/19/2015 Document Reviewed: 09/05/2014 °Elsevier Interactive Patient Education ©2016 [employer] ° ° °RICE for Routine Care of Injuries °The routine care of many injuries includes rest, ice, compression, and elevation (RICE therapy). RICE therapy is often recommended for injuries to soft tissues, such as a muscle strain, ligament injuries, bruises, and overuse injuries. It can also be used for some bony injuries. Using RICE therapy can help to relieve pain, lessen swelling, and enable your body to heal. °Rest °Rest is required to allow your body to heal. This usually involves reducing your normal activities and avoiding use of the injured part of your body. Generally, you can return to your normal activities when you are comfortable and have been given permission by your health care provider. °Ice °Icing your injury helps to keep the swelling down, and it lessens pain. Do not apply ice directly to your skin. °· Put ice in a plastic bag. °· Place a towel between your skin and the bag. °· Leave the ice on for 20 minutes, 2-3 times a day. °Do this for as long as you are directed by your health care provider. °Compression °Compression means putting pressure on the injured area. Compression helps to keep swelling down, gives support, and  helps with discomfort. Compression may be done with an elastic bandage. If an elastic bandage has been applied, follow these general tips: °· Remove and reapply the bandage every 3-4 hours or as directed by your health care provider. °· Make sure the bandage is not wrapped too tightly, because this can cut off circulation. If part of your body beyond the bandage becomes blue, numb, cold, swollen, or more painful, your bandage is most likely too tight. If this occurs, remove your bandage and reapply it more loosely. °· See your health care provider if the bandage seems to be making your problems worse rather than better. °Elevation °Elevation means keeping the injured area raised. This helps to lessen swelling and decrease pain. If possible, your injured area should be elevated at or above the level of your heart or the center of your chest. °WHEN SHOULD I SEEK MEDICAL CARE? °You should seek medical care if: °· Your pain and swelling continue. °· Your symptoms are getting worse rather than improving. °These   symptoms may indicate that further evaluation or further X-rays are needed. Sometimes, X-rays may not show a small broken bone (fracture) until a number of days later. Make a follow-up appointment with your health care provider. WHEN SHOULD I SEEK IMMEDIATE MEDICAL CARE? You should seek immediate medical care if:  You have sudden severe pain at or below the area of your injury.  You have redness or increased swelling around your injury.  You have tingling or numbness at or below the area of your injury that does not improve after you remove the elastic bandage.   This information is not intended to replace advice given to you by your health care provider. Make sure you discuss any questions you have with your health care provider.   Document Released: 02/14/2001 Document Revised: 07/24/2015 Document Reviewed: 10/10/2014 [employer] Interactive Patient Education [employer]

## 2016-05-04 ENCOUNTER — Encounter (HOSPITAL_COMMUNITY): Payer: Self-pay | Admitting: *Deleted

## 2016-05-04 ENCOUNTER — Emergency Department (HOSPITAL_COMMUNITY): Payer: Self-pay

## 2016-05-04 ENCOUNTER — Emergency Department (HOSPITAL_COMMUNITY)
Admission: EM | Admit: 2016-05-04 | Discharge: 2016-05-04 | Disposition: A | Discharge status: Home / Self Care | Payer: Self-pay | Attending: Emergency Medicine | Admitting: Emergency Medicine

## 2016-05-04 DIAGNOSIS — F17210 Nicotine dependence, cigarettes, uncomplicated: Secondary | ICD-10-CM | POA: Insufficient documentation

## 2016-05-04 DIAGNOSIS — Y939 Activity, unspecified: Secondary | ICD-10-CM | POA: Insufficient documentation

## 2016-05-04 DIAGNOSIS — Y999 Unspecified external cause status: Secondary | ICD-10-CM | POA: Insufficient documentation

## 2016-05-04 DIAGNOSIS — W01198A Fall on same level from slipping, tripping and stumbling with subsequent striking against other object, initial encounter: Secondary | ICD-10-CM | POA: Insufficient documentation

## 2016-05-04 DIAGNOSIS — S0101XA Laceration without foreign body of scalp, initial encounter: Secondary | ICD-10-CM | POA: Insufficient documentation

## 2016-05-04 DIAGNOSIS — Y9289 Other specified places as the place of occurrence of the external cause: Secondary | ICD-10-CM | POA: Insufficient documentation

## 2016-05-04 MED ORDER — HYDROCODONE-ACETAMINOPHEN 5-325 MG PO TABS
1.0000 | ORAL_TABLET | Freq: Once | ORAL | Status: AC
  Administered 2016-05-04: 1 via ORAL
  Filled 2016-05-04: qty 1

## 2016-05-04 MED ORDER — LIDOCAINE HCL (PF) 1 % IJ SOLN
5.0000 mL | Freq: Once | INTRAMUSCULAR | Status: DC
  Filled 2016-05-04: qty 5

## 2016-05-04 MED ORDER — IBUPROFEN 800 MG PO TABS
800.0000 mg | ORAL_TABLET | Freq: Once | ORAL | Status: AC
Start: 2016-05-04 — End: 2016-05-04
  Administered 2016-05-04: 800 mg via ORAL
  Filled 2016-05-04: qty 1

## 2016-05-04 NOTE — ED Provider Notes (Signed)
CSN: [id number]     Arrival date & time 05/04/16  0912 History   First MD Initiated Contact with Patient 05/04/16 0920     Chief Complaint  Patient presents with  . Head Laceration     (Consider location/radiation/quality/duration/timing/severity/associated sxs/prior Treatment) HPI   Marvin Schroeder is a 40 y.o. male who presents to the Emergency Department complaining of laceration of the right scalp, headache and neck pain after an accidental fall in the bathroom of a local restaurant.  Incident occurred just prior to ED arrival.  He states that he slipped on wet floor and fell, striking his head on the toilet.  He reports right sided headache and mild dizziness with movement.  He denies LOC, visual change, back pain, vomiting or weakness.  Last Td is up to date.    Past Medical History  Diagnosis Date  . Back pain    Past Surgical History  Procedure Laterality Date  . Collarbone    . Orthopedic surgery    . Hand surgery     No family history on file. Social History  Substance Use Topics  . Smoking status: Current Some Day Smoker -- 0.50 packs/day    Types: Cigarettes  . Smokeless tobacco: None  . Alcohol Use: No    Review of Systems  Constitutional: Negative for fever and chills.  Musculoskeletal: Negative for back pain, joint swelling and arthralgias.  Skin: Positive for wound.       Laceration   Neurological: Negative for dizziness, weakness and numbness.  Hematological: Does not bruise/bleed easily.  All other systems reviewed and are negative.     Allergies  Penicillins; Bee venom; and Xylocaine dental  Home Medications   Prior to Admission medications   Medication Sig Start Date End Date Taking? Authorizing Provider  HYDROcodone-acetaminophen (NORCO/VICODIN) 5-325 MG tablet Take 1-2 tablets by mouth every 6 (six) hours as needed. 04/27/16   Kristen N Ward, DO  ibuprofen (ADVIL,MOTRIN) 800 MG tablet Take 1 tablet (800 mg total) by mouth every 8 (eight)  hours as needed for mild pain. 04/27/16   Kristen N Ward, DO  methocarbamol (ROBAXIN) 500 MG tablet Take 1 tablet (500 mg total) by mouth every 8 (eight) hours as needed for muscle spasms. 04/13/16   Derwood KaplanAnkit Nanavati, MD  oxyCODONE-acetaminophen (PERCOCET/ROXICET) 5-325 MG tablet Take 1-2 tablets by mouth every 4 (four) hours as needed. 04/30/16   Kristen N Ward, DO   BP 132/93 mmHg  Pulse 95  Temp(Src) 98 F (36.7 C) (Oral)  Resp 18  Ht 6' (1.829 m)  Wt 105.235 kg  BMI 31.46 kg/m2  SpO2 97% Physical Exam  Constitutional: He is oriented to person, place, and time. He appears well-developed and well-nourished. No distress.  HENT:  Head: Normocephalic and atraumatic.  Mouth/Throat: Oropharynx is clear and moist.  2 cm superficial lac to the right frontal scalp, small hematoma.  Bleeding controlled.    Eyes: Conjunctivae and EOM are normal. Pupils are equal, round, and reactive to light.  Neck: Normal range of motion.  ttp of the mid c spine w/o bony step offs  Cardiovascular: Normal rate, regular rhythm and intact distal pulses.   No murmur heard. Pulmonary/Chest: Effort normal and breath sounds normal. No respiratory distress.  Musculoskeletal: Normal range of motion. He exhibits no edema or tenderness.  Neurological: He is alert and oriented to person, place, and time. He exhibits normal muscle tone. Coordination normal.  5/5 grip strength bilateral upper and lower extremities.  No gross  sensory deficits.   Skin: Skin is warm.  Nursing note and vitals reviewed.   ED Course  Procedures (including critical care time) Labs Review Labs Reviewed - No data to display  Imaging Review Dg Cervical Spine Complete  05/04/2016  CLINICAL DATA:  Initial encounter for POST RIGHT SIDE NECK PAIN WITH DIZZINESS, PRESSURE BEHIND EYES, AND SMALL LAC TO RIGHT ANT SKULL S/P SLIPPING ON WATER IN FLOOR AT [facility] H[facility] HEAD ON TOILET THIS AM EXAM: CERVICAL SPINE - COMPLETE 4+ VIEW COMPARISON:  CT of  01/27/2010 FINDINGS: The lateral view images through the bottom of C7. Prevertebral soft tissues are within normal limits. No fracture or subluxation identified. Intervertebral disc height is maintained. Facets are well-aligned. Odontoid process and lateral masses partially obscured on open-mouth view. IMPRESSION: Suboptimal evaluation of the C1-2 level. Otherwise, no acute finding in the cervical spine. Electronically Signed   By: Jeronimo Greaves M.D.   On: 05/04/2016 10:19   I have personally reviewed and evaluated these images and lab results as part of my medical decision-making.   EKG Interpretation None       LACERATION REPAIR Performed by: Sabriel Borromeo L. Authorized by: Maxwell Caul Consent: Verbal consent obtained. Risks and benefits: risks, benefits and alternatives were discussed Consent given by: patient Patient identity confirmed: provided demographic data Prepped and Draped in normal sterile fashion Wound explored  Laceration Location: right frontal scalp  Laceration Length: 2 cm  No Foreign Bodies seen or palpated  Anesthesia: local infiltration  Local anesthetic: lidocaine 1 % w/o epinephrine  Anesthetic total: 3 ml  Irrigation method: syringe Amount of cleaning: standard  Skin closure: staples  Number of staples: 2 Technique: stapling  Patient tolerance: Patient tolerated the procedure well with no immediate complications.   MDM   Final diagnoses:  Scalp laceration, initial encounter    Pt well appearing, speech clear, ambulates in the dept with steady gait.  No hx of LOC.  Doubt intracranial injury.  Wound care instructions given.  Staples out in 7 days.   Agrees to return for worsening symptoms    Pauline Aus, PA-C 05/04/16 1644  Zadie Rhine, MD 05/05/16 [phone]

## 2016-05-04 NOTE — ED Notes (Addendum)
Pt brought in by [facility] after fall in the [facility] hitting his head on the toilet. EMS reports pt slipped on some water in the bathroom before falling. No LOC. Pt ambulatory from ambulance to ED room. Bleeding controlled. Pt c/o dizziness at this time.

## 2016-05-04 NOTE — Discharge Instructions (Signed)
Laceration Care, Adult °A laceration is a cut that goes through all layers of the skin. The cut also goes into the tissue that is right under the skin. Some cuts heal on their own. Others need to be closed with stitches (sutures), staples, skin adhesive strips, or wound glue. Taking care of your cut lowers your risk of infection and helps your cut to heal better. °HOW TO TAKE CARE OF YOUR CUT °For stitches or staples: °· Keep the wound clean and dry. °· If you were given a bandage (dressing), you should change it at least one time per day or as told by your doctor. You should also change it if it gets wet or dirty. °· Keep the wound completely dry for the first 24 hours or as told by your doctor. After that time, you may take a shower or a bath. However, make sure that the wound is not soaked in water until after the stitches or staples have been removed. °· Clean the wound one time each day or as told by your doctor: °· Wash the wound with soap and water. °· Rinse the wound with water until all of the soap comes off. °· Pat the wound dry with a clean towel. Do not rub the wound. °· After you clean the wound, put a thin layer of antibiotic ointment on it as told by your doctor. This ointment: °· Helps to prevent infection. °· Keeps the bandage from sticking to the wound. °· Have your stitches or staples removed as told by your doctor. °If your doctor used skin adhesive strips:  °· Keep the wound clean and dry. °· If you were given a bandage, you should change it at least one time per day or as told by your doctor. You should also change it if it gets dirty or wet. °· Do not get the skin adhesive strips wet. You can take a shower or a bath, but be careful to keep the wound dry. °· If the wound gets wet, pat it dry with a clean towel. Do not rub the wound. °· Skin adhesive strips fall off on their own. You can trim the strips as the wound heals. Do not remove any strips that are still stuck to the wound. They will  fall off after a while. °If your doctor used wound glue: °· Try to keep your wound dry, but you may briefly wet it in the shower or bath. Do not soak the wound in water, such as by swimming. °· After you take a shower or a bath, gently pat the wound dry with a clean towel. Do not rub the wound. °· Do not do any activities that will make you really sweaty until the skin glue has fallen off on its own. °· Do not apply liquid, cream, or ointment medicine to your wound while the skin glue is still on. °· If you were given a bandage, you should change it at least one time per day or as told by your doctor. You should also change it if it gets dirty or wet. °· If a bandage is placed over the wound, do not let the tape for the bandage touch the skin glue. °· Do not pick at the glue. The skin glue usually stays on for 5-10 days. Then, it falls off of the skin. °General Instructions  °· To help prevent scarring, make sure to cover your wound with sunscreen whenever you are outside after stitches are removed, after adhesive strips are removed,   or when wound glue stays in place and the wound is healed. Make sure to wear a sunscreen of at least 30 SPF. °· Take over-the-counter and prescription medicines only as told by your doctor. °· If you were given antibiotic medicine or ointment, take or apply it as told by your doctor. Do not stop using the antibiotic even if your wound is getting better. °· Do not scratch or pick at the wound. °· Keep all follow-up visits as told by your doctor. This is important. °· Check your wound every day for signs of infection. Watch for: °¨ Redness, swelling, or pain. °¨ Fluid, blood, or pus. °· Raise (elevate) the injured area above the level of your heart while you are sitting or lying down, if possible. °GET HELP IF: °· You got a tetanus shot and you have any of these problems at the injection site: °¨ Swelling. °¨ Very bad pain. °¨ Redness. °¨ Bleeding. °· You have a fever. °· A wound that was  closed breaks open. °· You notice a bad smell coming from your wound or your bandage. °· You notice something coming out of the wound, such as wood or glass. °· Medicine does not help your pain. °· You have more redness, swelling, or pain at the site of your wound. °· You have fluid, blood, or pus coming from your wound. °· You notice a change in the color of your skin near your wound. °· You need to change the bandage often because fluid, blood, or pus is coming from the wound. °· You start to have a new rash. °· You start to have numbness around the wound. °GET HELP RIGHT AWAY IF: °· You have very bad swelling around the wound. °· Your pain suddenly gets worse and is very bad. °· You notice painful lumps near the wound or on skin that is anywhere on your body. °· You have a red streak going away from your wound. °· The wound is on your hand or foot and you cannot move a finger or toe like you usually can. °· The wound is on your hand or foot and you notice that your fingers or toes look pale or bluish. °  °This information is not intended to replace advice given to you by your health care provider. Make sure you discuss any questions you have with your health care provider. °  °Document Released: 04/20/2008 Document Revised: 03/19/2015 Document Reviewed: 10/29/2014 °Elsevier Interactive Patient Education ©2016 [employer] ° °Stitches, Staples, or Adhesive Wound Closure °Doctors use stitches (sutures), staples, and certain glue (skin adhesives) to hold your skin together while it heals (wound closure). You may need this treatment after you have surgery or if you cut your skin accidentally. These methods help your skin heal more quickly. They also make it less likely that you will have a scar. °WHAT ARE THE DIFFERENT KINDS OF WOUND CLOSURES? °There are many options for wound closure. The one that your doctor uses depends on how deep and large your wound is. °Adhesive Glue °To use this glue to close a wound, your  doctor holds the edges of the wound together and paints the glue on the surface of your skin. You may need more than one layer of glue. Then the wound may be covered with a light bandage (dressing). °This type of skin closure may be used for small wounds that are not deep (superficial). Using glue for wound closure is less painful than other methods. It does not require   a medicine that numbs the area. This method also leaves nothing to be removed. Adhesive glue is often used for children and on facial wounds. °Adhesive glue cannot be used for wounds that are deep, uneven, or bleeding. It is not used inside of a wound.  °Adhesive Strips °These strips are made of sticky (adhesive), porous paper. They are placed across your skin edges like a regular adhesive bandage. You leave them on until they fall off. °Adhesive strips may be used to close very superficial wounds. They may also be used along with sutures to improve closure of your skin edges.  °Sutures °Sutures are the oldest method of wound closure. Sutures can be made from natural or synthetic materials. They can be made from a material that your body can break down as your wound heals (absorbable), or they can be made from a material that needs to be removed from your skin (nonabsorbable). They come in many different strengths and sizes. °Your doctor attaches the sutures to a steel needle on one end. Sutures can be passed through your skin, or through the tissues beneath your skin. Then they are tied and cut. Your skin edges may be closed in one continuous stitch or in separate stitches. °Sutures are strong and can be used for all kinds of wounds. Absorbable sutures may be used to close tissues under the skin. The disadvantage of sutures is that they may cause skin reactions that lead to infection. Nonabsorbable sutures need to be removed. °Staples °When surgical staples are used to close a wound, the edges of your skin on both sides of the wound are brought  close together. A staple is placed across the wound, and an instrument secures the edges together. Staples are often used to close surgical cuts (incisions). °Staples are faster to use than sutures, and they cause less reaction from your skin. Staples need to be removed using a tool that bends the staples away from your skin. °HOW DO I CARE FOR MY WOUND CLOSURE? °· Take medicines only as told by your doctor. °· If you were prescribed an antibiotic medicine for your wound, finish it all even if you start to feel better. °· Use ointments or creams only as told by your doctor. °· Wash your hands with soap and water before and after touching your wound. °· Do not soak your wound in water. Do not take baths, swim, or use a hot tub until your doctor says it is okay. °· Ask your doctor when you can start showering. Cover your wound if told by your doctor. °· Do not take out your own sutures or staples. °· Do not pick at your wound. Picking can cause an infection. °· Keep all follow-up visits as told by your doctor. This is important. °HOW LONG WILL I HAVE MY WOUND CLOSURE?  °· Leave adhesive glue on your skin until the glue peels away. °· Leave adhesive strips on your skin until they fall off. °· Absorbable sutures will dissolve within several days. °· Nonabsorbable sutures and staples must be removed. The location of the wound will determine how long they stay in. This can range from several days to a couple of weeks. °WHEN SHOULD I SEEK HELP FOR MY WOUND CLOSURE? °Contact your doctor if: °· You have a fever. °· You have chills. °· You have redness, puffiness (swelling), or pain at the site of your wound. °· You have fluid, blood, or pus coming from your wound. °· There is a bad smell coming   from your wound. °· The skin edges of your wound start to separate after your sutures have been removed. °· Your wound becomes thick, raised, and darker in color after your sutures come out (scarring). °  °This information is not  intended to replace advice given to you by your health care provider. Make sure you discuss any questions you have with your health care provider. °  °Document Released: 08/30/2009 Document Revised: 11/23/2014 Document Reviewed: 04/11/2014 °Elsevier Interactive Patient Education ©2016 [employer] ° °

## 2016-05-05 ENCOUNTER — Encounter (HOSPITAL_COMMUNITY): Payer: Self-pay | Admitting: Emergency Medicine

## 2016-05-05 ENCOUNTER — Emergency Department (HOSPITAL_COMMUNITY)
Admission: EM | Admit: 2016-05-05 | Discharge: 2016-05-05 | Disposition: A | Discharge status: Home / Self Care | Payer: No Typology Code available for payment source | Attending: Emergency Medicine | Admitting: Emergency Medicine

## 2016-05-05 DIAGNOSIS — F17210 Nicotine dependence, cigarettes, uncomplicated: Secondary | ICD-10-CM | POA: Insufficient documentation

## 2016-05-05 DIAGNOSIS — M542 Cervicalgia: Secondary | ICD-10-CM | POA: Insufficient documentation

## 2016-05-05 MED ORDER — IBUPROFEN 600 MG PO TABS: 600.0000 mg | ORAL_TABLET | Freq: Three times a day (TID) | ORAL | Status: DC | PRN

## 2016-05-05 NOTE — ED Provider Notes (Signed)
CSN: [id number]     Arrival date & time 05/05/16  1459 History   First MD Initiated Contact with Patient 05/05/16 1533     Chief Complaint  Patient presents with  . Neck Pain      HPI Patient presents emergency department with complaints of ongoing neck pain and discomfort.  He's been trying over-the-counter medications without improvement in his symptoms.  He denies weakness of his arms or legs.  No other complaints at this time.   Past Medical History  Diagnosis Date  . Back pain    Past Surgical History  Procedure Laterality Date  . Collarbone    . Orthopedic surgery    . Hand surgery     History reviewed. No pertinent family history. Social History  Substance Use Topics  . Smoking status: Current Some Day Smoker -- 0.50 packs/day    Types: Cigarettes  . Smokeless tobacco: None  . Alcohol Use: No    Review of Systems  All other systems reviewed and are negative.     Allergies  Penicillins; Bee venom; and Xylocaine dental  Home Medications   Prior to Admission medications   Medication Sig Start Date End Date Taking? Authorizing Provider  Aspirin-Salicylamide-Caffeine (BC HEADACHE POWDER PO) Take 1 Package by mouth daily as needed (pain).    Historical Provider, MD  ibuprofen (ADVIL,MOTRIN) 600 MG tablet Take 1 tablet (600 mg total) by mouth every 8 (eight) hours as needed. 05/05/16   Azalia Bilis, MD  Multiple Vitamin (MULTIVITAMIN WITH MINERALS) TABS tablet Take 1 tablet by mouth daily.    Historical Provider, MD   BP 121/71 mmHg  Pulse 66  Temp(Src) 98.7 F (37.1 C) (Temporal)  Resp 18  Ht 6' (1.829 m)  Wt 232 lb (105.235 kg)  BMI 31.46 kg/m2  SpO2 98% Physical Exam  Constitutional: He is oriented to person, place, and time. He appears well-developed and well-nourished.  HENT:  Head: Normocephalic.  Eyes: EOM are normal.  Neck: Normal range of motion. Neck supple.  Mild paracervical tenderness without cervical spasm  Pulmonary/Chest: Effort normal.   Abdominal: He exhibits no distension.  Musculoskeletal: Normal range of motion.  Neurological: He is alert and oriented to person, place, and time.  Psychiatric: He has a normal mood and affect.  Nursing note and vitals reviewed.   ED Course  Procedures (including critical care time) Labs Review Labs Reviewed - No data to display  Imaging Review Dg Cervical Spine Complete  05/04/2016  CLINICAL DATA:  Initial encounter for POST RIGHT SIDE NECK PAIN WITH DIZZINESS, PRESSURE BEHIND EYES, AND SMALL LAC TO RIGHT ANT SKULL S/P SLIPPING ON WATER IN FLOOR AT [facility] HEAD ON TOILET THIS AM EXAM: CERVICAL SPINE - COMPLETE 4+ VIEW COMPARISON:  CT of 01/27/2010 FINDINGS: The lateral view images through the bottom of C7. Prevertebral soft tissues are within normal limits. No fracture or subluxation identified. Intervertebral disc height is maintained. Facets are well-aligned. Odontoid process and lateral masses partially obscured on open-mouth view. IMPRESSION: Suboptimal evaluation of the C1-2 level. Otherwise, no acute finding in the cervical spine. Electronically Signed   By: Jeronimo Greaves M.D.   On: 05/04/2016 10:19   I have personally reviewed and evaluated these images and lab results as part of my medical decision-making.   EKG Interpretation None      MDM   Final diagnoses:  Neck pain    Home with ibuprofen 3 times a day.  No indication for additional workup at this time.  Ambulatory in the ER.  Normal strength in arms    Azalia BilisKevin Leslieanne Cobarrubias, MD 05/05/16 [phone]

## 2016-05-05 NOTE — ED Notes (Signed)
PT states continued neck pain from fall yesterday morning with no relief from medications.

## 2016-05-08 ENCOUNTER — Emergency Department (HOSPITAL_COMMUNITY)
Admission: EM | Admit: 2016-05-08 | Discharge: 2016-05-08 | Disposition: A | Discharge status: Home / Self Care | Payer: No Typology Code available for payment source | Attending: Emergency Medicine | Admitting: Emergency Medicine

## 2016-05-08 ENCOUNTER — Encounter (HOSPITAL_COMMUNITY): Payer: Self-pay

## 2016-05-08 DIAGNOSIS — Z4802 Encounter for removal of sutures: Secondary | ICD-10-CM | POA: Insufficient documentation

## 2016-05-08 DIAGNOSIS — F17210 Nicotine dependence, cigarettes, uncomplicated: Secondary | ICD-10-CM | POA: Insufficient documentation

## 2016-05-08 MED ORDER — MUPIROCIN CALCIUM 2 % EX CREA: 1.0000 "application " | TOPICAL_CREAM | Freq: Two times a day (BID) | CUTANEOUS | Status: DC

## 2016-05-08 NOTE — ED Notes (Signed)
Pt alert & oriented x4, stable gait. Patient given discharge instructions, paperwork & prescription(s). Patient  instructed to stop at the registration desk to finish any additional paperwork. Patient verbalized understanding. Pt left department w/ no further questions. 

## 2016-05-08 NOTE — Discharge Instructions (Signed)
Wound Closure Removal °The staples, stitches, or skin adhesives that were used to close your skin have been removed. You will need to continue the care described here until the wound is completely healed and your health care provider confirms that wound care can be stopped. °HOW DO I CARE FOR MY WOUND? °How you care for your wound after the wound closure has been removed depends on the kind of wound closure you had. °Stitches or Staples °· Keep the wound site dry and clean. Do not soak it in water. °· If skin adhesive strips were applied after the staples were removed, they will begin to peel off in a few days. Allow them to remain in place until they fall off on their own. °· If you still have a bandage (dressing), change it at least once a day or as directed by your health care provider. If the dressing sticks, pour warm, sterile water over it until it loosens and can be removed without pulling apart the wound edges. Pat the area dry with a soft, clean towel. Do not rub the wound because that may cause bleeding. °· Apply cream or ointment that stops the growth of bacteria (antibacterial cream or antibacterial ointment) only if your health care provider has directed you to do so. °· Place a nonstick bandage over the wound to prevent the dressing from sticking. °· Cover the nonstick bandage with a new dressing as directed by your health care provider. °· If the bandage becomes wet or dirty or it develops a bad smell, change it as soon as possible. °· Take medicines only as directed by your health care provider. °Adhesive Strips or Glue °· Adhesive strips and glue peel off on their own. °· Leave adhesive strips and glue in place until they fall off. °ARE THERE ANY BATHING RESTRICTIONS ONCE MY WOUND CLOSURE IS REMOVED? °Do not take baths, swim, or use a hot tub until your health care provider approves. °HOW CAN I DECREASE THE SIZE OF MY SCAR? °How your scar heals and the size of your scar depend on many factors, such  as your age, the type of scar you have, and genetic factors. The following may help decrease the size of your scar: °· Sunscreen. Use sunscreen with a sun protection factor (SPF) of at least 15 when out in the sun. Reapply the sunscreen every two hours. °· Friction massage. Once your wound is completely healed, you can gently massage the scarred area. This can decrease scar thickness. °WHEN SHOULD I SEEK HELP?  °Seek help if: °· You have a fever. °· You have chills. °· You have drainage, redness, swelling, or pain at your wound. °· There is a bad smell coming from your wound. °· Your wound edges open up or do not stay closed after the wound closure has been removed. °  °This information is not intended to replace advice given to you by your health care provider. Make sure you discuss any questions you have with your health care provider. °  °Document Released: 10/15/2008 Document Revised: 11/23/2014 Document Reviewed: 03/20/2014 °Elsevier Interactive Patient Education ©2016 [employer] ° °

## 2016-05-08 NOTE — ED Notes (Signed)
Removed one staple from scalp.

## 2016-05-08 NOTE — ED Provider Notes (Signed)
CSN: [id number]     Arrival date & time 05/08/16  0609 History   None    Chief Complaint  Patient presents with  . Suture / Staple Removal     (Consider location/radiation/quality/duration/timing/severity/associated sxs/prior Treatment) HPI Comments: Patient has staples on the right side of his scalp. Patient reports that one actually came out already and the other one feels loose and is causing him pain. No redness or drainage.  Patient is a 40 y.o. male presenting with suture removal.  Suture / Staple Removal    Past Medical History  Diagnosis Date  . Back pain    Past Surgical History  Procedure Laterality Date  . Collarbone    . Orthopedic surgery    . Hand surgery     No family history on file. Social History  Substance Use Topics  . Smoking status: Current Some Day Smoker -- 0.50 packs/day    Types: Cigarettes  . Smokeless tobacco: None  . Alcohol Use: No    Review of Systems  Skin: Positive for wound.      Allergies  Penicillins; Bee venom; and Xylocaine dental  Home Medications   Prior to Admission medications   Medication Sig Start Date End Date Taking? Authorizing Provider  Aspirin-Salicylamide-Caffeine (BC HEADACHE POWDER PO) Take 1 Package by mouth daily as needed (pain).    Historical Provider, MD  ibuprofen (ADVIL,MOTRIN) 600 MG tablet Take 1 tablet (600 mg total) by mouth every 8 (eight) hours as needed. 05/05/16   Azalia BilisKevin Campos, MD  Multiple Vitamin (MULTIVITAMIN WITH MINERALS) TABS tablet Take 1 tablet by mouth daily.    Historical Provider, MD  mupirocin cream (BACTROBAN) 2 % Apply 1 application topically 2 (two) times daily. 05/08/16   Gilda Creasehristopher J Pollina, MD   There were no vitals taken for this visit. Physical Exam  Constitutional: He is oriented to person, place, and time. He appears well-developed and well-nourished. No distress.  HENT:  Head: Normocephalic and atraumatic.  Right Ear: Hearing normal.  Left Ear: Hearing normal.  Nose:  Nose normal.  Mouth/Throat: Oropharynx is clear and moist and mucous membranes are normal.  Eyes: Conjunctivae and EOM are normal. Pupils are equal, round, and reactive to light.  Neck: Normal range of motion. Neck supple.  Cardiovascular: Regular rhythm, S1 normal and S2 normal.  Exam reveals no gallop and no friction rub.   No murmur heard. Pulmonary/Chest: Effort normal and breath sounds normal. No respiratory distress. He exhibits no tenderness.  Abdominal: Soft. Normal appearance and bowel sounds are normal. There is no hepatosplenomegaly. There is no tenderness. There is no rebound, no guarding, no tenderness at McBurney's point and negative Murphy's sign. No hernia.  Musculoskeletal: Normal range of motion.  Neurological: He is alert and oriented to person, place, and time. He has normal strength. No cranial nerve deficit or sensory deficit. Coordination normal. GCS eye subscore is 4. GCS verbal subscore is 5. GCS motor subscore is 6.  Skin: Skin is warm, dry and intact. No rash noted. No cyanosis.  Single staple right superior scalp, no drainage, induration or fluctuance  Psychiatric: He has a normal mood and affect. His speech is normal and behavior is normal. Thought content normal.  Nursing note and vitals reviewed.   ED Course  Procedures (including critical care time) Labs Review Labs Reviewed - No data to display  Imaging Review No results found. I have personally reviewed and evaluated these images and lab results as part of my medical decision-making.  EKG Interpretation None      MDM   Final diagnoses:  Encounter for staple removal        Gilda Creasehristopher J Pollina, MD 05/08/16 [phone]

## 2016-05-08 NOTE — ED Notes (Signed)
Staple to right side of scalp, states had one come out already and wants the other one out.

## 2016-06-02 ENCOUNTER — Encounter (HOSPITAL_COMMUNITY): Payer: Self-pay | Admitting: Emergency Medicine

## 2016-06-02 ENCOUNTER — Emergency Department (HOSPITAL_COMMUNITY)
Admission: EM | Admit: 2016-06-02 | Discharge: 2016-06-02 | Disposition: A | Discharge status: Home / Self Care | Payer: No Typology Code available for payment source | Attending: Emergency Medicine | Admitting: Emergency Medicine

## 2016-06-02 DIAGNOSIS — K0889 Other specified disorders of teeth and supporting structures: Secondary | ICD-10-CM

## 2016-06-02 DIAGNOSIS — F17210 Nicotine dependence, cigarettes, uncomplicated: Secondary | ICD-10-CM | POA: Insufficient documentation

## 2016-06-02 MED ORDER — BUPIVACAINE HCL (PF) 0.25 % IJ SOLN
20.0000 mL | Freq: Once | INTRAMUSCULAR | Status: DC
  Filled 2016-06-02: qty 30

## 2016-06-02 MED ORDER — CLINDAMYCIN HCL 150 MG PO CAPS
300.0000 mg | ORAL_CAPSULE | Freq: Once | ORAL | Status: AC
  Administered 2016-06-02: 300 mg via ORAL
  Filled 2016-06-02: qty 2

## 2016-06-02 MED ORDER — KETOROLAC TROMETHAMINE 60 MG/2ML IM SOLN
60.0000 mg | Freq: Once | INTRAMUSCULAR | Status: AC
  Administered 2016-06-02: 60 mg via INTRAMUSCULAR
  Filled 2016-06-02: qty 2

## 2016-06-02 MED ORDER — CLINDAMYCIN HCL 300 MG PO CAPS: 300.0000 mg | ORAL_CAPSULE | Freq: Three times a day (TID) | ORAL | Status: DC

## 2016-06-02 MED ORDER — IBUPROFEN 600 MG PO TABS: 600.0000 mg | ORAL_TABLET | Freq: Three times a day (TID) | ORAL | Status: DC | PRN

## 2016-06-02 NOTE — ED Provider Notes (Signed)
CSN: [id number]     Arrival date & time 06/02/16  0401 History   First MD Initiated Contact with Patient 06/02/16 [phone]     Chief Complaint  Patient presents with  . Dental Pain      HPI Patient presents complaining of left upper dental pain over the past 24 hours.  Denies fevers and chills.  Denies facial swelling.  He's tried ibuprofen and BC powders at home without improvement in his symptoms.  No other complaints.  Pain is moderate to severe in severity at this time.   Past Medical History  Diagnosis Date  . Back pain    Past Surgical History  Procedure Laterality Date  . Collarbone    . Orthopedic surgery    . Hand surgery     History reviewed. No pertinent family history. Social History  Substance Use Topics  . Smoking status: Current Some Day Smoker -- 0.50 packs/day    Types: Cigarettes  . Smokeless tobacco: None  . Alcohol Use: No    Review of Systems  All other systems reviewed and are negative.     Allergies  Penicillins; Bee venom; and Xylocaine dental  Home Medications   Prior to Admission medications   Medication Sig Start Date End Date Taking? Authorizing Provider  Aspirin-Salicylamide-Caffeine (BC HEADACHE POWDER PO) Take 1 Package by mouth daily as needed (pain).    Historical Provider, MD  clindamycin (CLEOCIN) 300 MG capsule Take 1 capsule (300 mg total) by mouth 3 (three) times daily. 06/02/16   Azalia BilisKevin Kaleiyah Polsky, MD  ibuprofen (ADVIL,MOTRIN) 600 MG tablet Take 1 tablet (600 mg total) by mouth every 8 (eight) hours as needed. 06/02/16   Azalia BilisKevin Kalyb Pemble, MD  Multiple Vitamin (MULTIVITAMIN WITH MINERALS) TABS tablet Take 1 tablet by mouth daily.    Historical Provider, MD  mupirocin cream (BACTROBAN) 2 % Apply 1 application topically 2 (two) times daily. 05/08/16   Gilda Creasehristopher J Pollina, MD   BP 109/82 mmHg  Pulse 107  Temp(Src) 98.5 F (36.9 C)  Resp 20  Ht 6' (1.829 m)  Wt 235 lb (106.595 kg)  BMI 31.86 kg/m2  SpO2 97% Physical Exam   Constitutional: He is oriented to person, place, and time. He appears well-developed and well-nourished.  HENT:  Head: Normocephalic.  Poor dentition throughout.  No gingival swelling or fluctuance.  Tolerating secretions.  Oral airway patent.  No facial swelling.  Eyes: EOM are normal.  Neck: Normal range of motion.  Pulmonary/Chest: Effort normal.  Abdominal: He exhibits no distension.  Musculoskeletal: Normal range of motion.  Neurological: He is alert and oriented to person, place, and time.  Psychiatric: He has a normal mood and affect.  Nursing note and vitals reviewed.   ED Course  Procedures (including critical care time) Labs Review Labs Reviewed - No data to display  Imaging Review No results found. I have personally reviewed and evaluated these images and lab results as part of my medical decision-making.   EKG Interpretation None      MDM   Final diagnoses:  Pain, dental      Dental Pain. Home with antibiotics and NSAIDs. Recommend dental follow up. No signs of gingival abscess. Tolerating secretions. Airway patent. No sub lingular swelling   Azalia BilisKevin Alecsander Hattabaugh, MD 06/02/16 [phone]

## 2016-06-02 NOTE — Discharge Instructions (Signed)
Community Resource Guide Dental °The [employer]’s “211” is a great source of information about community services available.  Access by dialing 2-1-1 from anywhere in [place], or by website -  www.nc211.org.  ° °Other Local Resources (Updated 11/2015) ° °Dental  Care °  °Services ° °  °Phone Number and Address  °Cost  °[facility] For children 0 - 38 years of age:  °• Cleaning °• Tooth brushing/flossing instruction °• Sealants, fillings, crowns °• Extractions °• Emergency treatment  [phone] °[address] °[place], [place] [address] Charges based on family income.  Medicaid and some insurance plans accepted.   °  °[facility] [place] [facility] • Cleaning °• Sealants, fillings, crowns °• Extractions °• Emergency treatment [phone] °[address] °[place], [place] ° Pregnant women 18 years of age or older with a Medicaid card  °[facility] [place] • Cleaning °• Sealants, fillings, crowns °• Extractions °• Emergency treatment [phone] °[address] °[place], [place] Pregnant women 18 years of age or older with a Medicaid card  °[facility] For children 0 - 38 years of age:  °• Cleaning °• Tooth brushing/flossing instruction °• Sealants, fillings, crowns °• Extractions °• Emergency treatment °Limited orthodontic services for patients with Medicaid [phone] °[address] °[place], [place] [address] Medicaid and [place] Health Choice cover for children up to age 38 and pregnant women.  Parents of children up to age 38 without Medicaid pay a reduced fee at time of service.  °[facility] [place] For children 0 - 38 years of age:  °• Cleaning °• Tooth brushing/flossing instruction °• Sealants, fillings, crowns °• Extractions °• Emergency treatment °Limited orthodontic services for patients with Medicaid  [phone] °[address] °[place], [place].  Medicaid and [place] Health Choice cover for children up to age 38 and pregnant women.  Parents of children up to age 38 without Medicaid pay a reduced fee.  °[facility] • Cleaning °• Sealants, fillings, crowns °• Extractions ° °Hours: Tuesdays and Thursdays, 4:15 - 8 pm [phone] °[address] °[place], [place] [address] Services free of charge to [place] residents ages 18-64 who do not have health insurance, Medicare, Medicaid, or VA benefits and fall within federal poverty guidelines  °[facility] ° ° ° Provides dental care in addition to primary medical care, nutritional counseling, and pharmacy: °• Cleaning °• Sealants, fillings, crowns °• Extractions ° ° ° ° ° ° ° ° ° ° ° ° ° ° ° ° ° [phone] °[facility], [address] °[place], [place] ° °[phone] °[facility], [address] [place], [place] ° °[phone] °[facility] °[place], [place] ° °[phone] °[facility], [address] °[place], [place] ° °[phone] °[facility] °[address] °[place], [place] Accepts Medicaid, Medicare, most insurance.  Also provides services available to all with fees adjusted based on ability to pay.    °[facility] • Cleaning °• Tooth brushing/flossing instruction °• Sealants, fillings, crowns °• Extractions °• Emergency treatment °Hours: Tuesdays, Thursdays, and Fridays from 8 am to 5 pm by appointment only. [phone] °[address] °[place], [place] [address] [place] residents with Medicaid (depending on eligibility) and children with [place] Health Choice - call for more information.  °  [facility] • Extractions only ° °Hours: 2nd and 4th Thursday of each month from 6:30 am - 9 am.   [phone] ext. [phone] °[address] °[place], [place] [address] Ages 18 and older only.  Patients are seen on a first come, first served basis.  °[facility] • Cleanings °• Fillings °• Extractions °• Orthodontics °• Endodontics °• Implants/Crowns/Bridges °• Complete and partial dentures [phone] °[place], [place] Patients must complete an application for services.  There is often a waiting list.   ° °

## 2016-06-02 NOTE — ED Notes (Signed)
Pt c/o dental pain

## 2016-06-05 ENCOUNTER — Encounter (HOSPITAL_COMMUNITY): Payer: Self-pay | Admitting: *Deleted

## 2016-06-05 ENCOUNTER — Emergency Department (HOSPITAL_COMMUNITY)
Admission: EM | Admit: 2016-06-05 | Discharge: 2016-06-05 | Disposition: A | Discharge status: Home / Self Care | Payer: No Typology Code available for payment source | Attending: Dermatology | Admitting: Dermatology

## 2016-06-05 DIAGNOSIS — R109 Unspecified abdominal pain: Secondary | ICD-10-CM | POA: Insufficient documentation

## 2016-06-05 DIAGNOSIS — F17210 Nicotine dependence, cigarettes, uncomplicated: Secondary | ICD-10-CM | POA: Insufficient documentation

## 2016-06-05 DIAGNOSIS — Z5321 Procedure and treatment not carried out due to patient leaving prior to being seen by health care provider: Secondary | ICD-10-CM | POA: Insufficient documentation

## 2016-06-05 LAB — URINALYSIS, ROUTINE W REFLEX MICROSCOPIC
Bilirubin Urine: NEGATIVE
GLUCOSE, UA: NEGATIVE mg/dL
HGB URINE DIPSTICK: NEGATIVE
KETONES UR: NEGATIVE mg/dL
Nitrite: NEGATIVE
PROTEIN: NEGATIVE mg/dL
Specific Gravity, Urine: <1.005 — ABNORMAL LOW (ref 1.005–1.030)
pH: 5.5 (ref 5.0–8.0)

## 2016-06-05 LAB — URINE MICROSCOPIC-ADD ON

## 2016-06-05 NOTE — ED Notes (Signed)
Called for triage with no answer

## 2016-06-05 NOTE — ED Notes (Signed)
Pt comes in with bilateral flank pain. Pt is also having urinary problems. He states he can't control his bladder, he can use the bathroom and minutes later he urinates on himself. Pt denies any hematuria.

## 2016-06-05 NOTE — ED Notes (Signed)
Called to place patient in room. No answer.  

## 2016-06-05 NOTE — ED Notes (Signed)
Called patient x 2. No answer. Patient is not in waiting room.

## 2016-06-27 ENCOUNTER — Encounter (HOSPITAL_COMMUNITY): Payer: Self-pay | Admitting: Emergency Medicine

## 2016-06-27 ENCOUNTER — Emergency Department (HOSPITAL_COMMUNITY)
Admission: EM | Admit: 2016-06-27 | Discharge: 2016-06-27 | Disposition: A | Discharge status: Home / Self Care | Payer: Self-pay | Attending: Emergency Medicine | Admitting: Emergency Medicine

## 2016-06-27 DIAGNOSIS — Z79899 Other long term (current) drug therapy: Secondary | ICD-10-CM | POA: Insufficient documentation

## 2016-06-27 DIAGNOSIS — L02416 Cutaneous abscess of left lower limb: Secondary | ICD-10-CM | POA: Insufficient documentation

## 2016-06-27 DIAGNOSIS — Z7982 Long term (current) use of aspirin: Secondary | ICD-10-CM | POA: Insufficient documentation

## 2016-06-27 DIAGNOSIS — L0390 Cellulitis, unspecified: Secondary | ICD-10-CM

## 2016-06-27 DIAGNOSIS — F17210 Nicotine dependence, cigarettes, uncomplicated: Secondary | ICD-10-CM | POA: Insufficient documentation

## 2016-06-27 DIAGNOSIS — L0291 Cutaneous abscess, unspecified: Secondary | ICD-10-CM

## 2016-06-27 DIAGNOSIS — Z791 Long term (current) use of non-steroidal anti-inflammatories (NSAID): Secondary | ICD-10-CM | POA: Insufficient documentation

## 2016-06-27 DIAGNOSIS — L03116 Cellulitis of left lower limb: Secondary | ICD-10-CM | POA: Insufficient documentation

## 2016-06-27 MED ORDER — IBUPROFEN 800 MG PO TABS
800.0000 mg | ORAL_TABLET | Freq: Once | ORAL | Status: DC
  Filled 2016-06-27: qty 1

## 2016-06-27 MED ORDER — IBUPROFEN 800 MG PO TABS: 800.0000 mg | ORAL_TABLET | Freq: Three times a day (TID) | ORAL | 0 refills | Status: DC

## 2016-06-27 MED ORDER — LIDOCAINE-EPINEPHRINE 1 %-1:100000 IJ SOLN
10.0000 mL | Freq: Once | INTRAMUSCULAR | Status: DC
  Filled 2016-06-27: qty 10

## 2016-06-27 MED ORDER — POVIDONE-IODINE 10 % EX SOLN
CUTANEOUS | Status: AC
  Filled 2016-06-27: qty 118

## 2016-06-27 MED ORDER — PROMETHAZINE HCL 25 MG PO TABS: 25.0000 mg | ORAL_TABLET | Freq: Four times a day (QID) | ORAL | 0 refills | Status: DC | PRN

## 2016-06-27 MED ORDER — SULFAMETHOXAZOLE-TRIMETHOPRIM 800-160 MG PO TABS: 1.0000 | ORAL_TABLET | Freq: Two times a day (BID) | ORAL | 0 refills | Status: AC

## 2016-06-27 MED ORDER — LIDOCAINE-EPINEPHRINE (PF) 1 %-1:200000 IJ SOLN
INTRAMUSCULAR | Status: AC
  Filled 2016-06-27: qty 30

## 2016-06-27 NOTE — Discharge Instructions (Signed)
Bactrim twice daily for 7 days Return for worsening swelling or pain or fevers or redness

## 2016-06-27 NOTE — ED Provider Notes (Signed)
[facility]-EM[facility]Y DEPT Provider Note   CSN: [id number] Arrival date & time: 06/27/16  1952  First Provider Contact:  First MD Initiated Contact with Patient 06/27/16 2048     By signing my name below, I, Doreatha Martin, attest that this documentation has been prepared under the direction and in the presence of Eber Hong, MD. Electronically Signed: Doreatha Martin, ED Scribe. 06/27/16. 9:05 PM.    History   Chief Complaint Chief Complaint  Patient presents with  . Abscess    HPI Marvin Schroeder is a 40 y.o. male who presents to the Emergency Department complaining of a moderate, gradually worsening area of redness, pain and swelling to the upper left thigh onset 4 days ago. He also complains of nausea, emesis and diaphoresis today. Per pt, he believes the area began as an insect bite, but did not see or feel a specific bite. Pt states he was successfully able to drain a moderate amount of pus from the area at home 2 days ago, but the area recurred today. Pt states pain is worsened with palpation and direct pressure. He denies fever, chills, additional areas.    The history is provided by the patient. No language interpreter was used.    Past Medical History:  Diagnosis Date  . Back pain     There are no active problems to display for this patient.   Past Surgical History:  Procedure Laterality Date  . collarbone    . HAND SURGERY    . ORTHOPEDIC SURGERY         Home Medications    Prior to Admission medications   Medication Sig Start Date End Date Taking? Authorizing Provider  Aspirin-Salicylamide-Caffeine (BC HEADACHE POWDER PO) Take 1 packet by mouth as needed (for headache/pain).    Yes Historical Provider, MD  cholecalciferol (VITAMIN D) 1000 units tablet Take 1,000 Units by mouth daily.   Yes Historical Provider, MD  Ibuprofen-Diphenhydramine Cit (ADVIL PM) 200-38 MG TABS Take 1 tablet by mouth at bedtime as needed (for sleep/pain).   Yes Historical Provider, MD    vitamin B-12 (CYANOCOBALAMIN) 1000 MCG tablet Take 1,000 mcg by mouth daily.   Yes Historical Provider, MD  vitamin C (ASCORBIC ACID) 500 MG tablet Take 1,000 mg by mouth daily.   Yes Historical Provider, MD  clindamycin (CLEOCIN) 300 MG capsule Take 1 capsule (300 mg total) by mouth 3 (three) times daily. Patient not taking: Reported on 06/27/2016 06/02/16   Azalia Bilis, MD  ibuprofen (ADVIL,MOTRIN) 600 MG tablet Take 1 tablet (600 mg total) by mouth every 8 (eight) hours as needed. Patient not taking: Reported on 06/27/2016 06/02/16   Azalia Bilis, MD  mupirocin cream (BACTROBAN) 2 % Apply 1 application topically 2 (two) times daily. Patient not taking: Reported on 06/27/2016 05/08/16   Gilda Crease, MD    Family History No family history on file.  Social History Social History  Substance Use Topics  . Smoking status: Current Some Day Smoker    Packs/day: 0.50    Types: Cigarettes  . Smokeless tobacco: Never Used  . Alcohol use No     Allergies   Amoxicillin; Bee venom; Penicillins; and Xylocaine dental [lidocaine-epinephrine]   Review of Systems Review of Systems  Constitutional: Positive for diaphoresis. Negative for chills and fever.  Gastrointestinal: Positive for nausea and vomiting.  Skin: Positive for rash.       +area of redness, pain, swelling and drainage to the left upper thigh  All other systems reviewed  and are negative.    Physical Exam Updated Vital Signs BP 142/99 (BP Location: Left Arm)   Pulse 97   Temp 97.9 F (36.6 C) (Oral)   Resp 18   Ht 6' (1.829 m)   Wt 230 lb (104.3 kg)   SpO2 94%   BMI 31.19 kg/m   Physical Exam  Constitutional: He appears well-developed and well-nourished.  HENT:  Head: Normocephalic and atraumatic.  Eyes: Conjunctivae are normal. Right eye exhibits no discharge. Left eye exhibits no discharge.  Pulmonary/Chest: Effort normal. No respiratory distress.  Neurological: He is alert. Coordination normal.  Skin:  Skin is warm and dry. No rash noted. He is not diaphoretic. There is erythema.   2 cm red, indurated superficial lesion to the left proximal medial thigh. No fluctuance. No surrounding cellulitis. No lymphadenopathy.   Psychiatric: He has a normal mood and affect.  Nursing note and vitals reviewed.    ED Treatments / Results  Labs (all labs ordered are listed, but only abnormal results are displayed) Labs Reviewed - No data to display  EKG  EKG Interpretation None       Radiology No results found.  Procedures .Marland Kitchen.Incision and Drainage Date/Time: 06/27/2016 9:58 PM Performed by: Eber HongMILLER, Trixy Loyola Authorized by: Eber HongMILLER, Demecia Northway   Consent:    Consent obtained:  Verbal   Consent given by:  Patient   Risks discussed:  Bleeding Location:    Type:  Abscess   Location:  Lower extremity   Lower extremity location:  Leg   Leg location:  L upper leg Pre-procedure details:    Skin preparation:  Betadine Anesthesia (see MAR for exact dosages):    Anesthesia method:  Local infiltration   Local anesthetic:  Lidocaine 1% WITH epi Procedure type:    Complexity:  Simple Procedure details:    Needle aspiration: no     Incision types:  Stab incision   Incision depth:  Dermal   Scalpel blade:  11   Wound management:  Probed and deloculated and irrigated with saline   Drainage:  Bloody   Drainage amount:  Scant   Wound treatment:  Wound left open   Packing materials:  None Post-procedure details:    Patient tolerance of procedure:  Tolerated well, no immediate complications   (including critical care time)  DIAGNOSTIC STUDIES: Oxygen Saturation is 94% on RA, adequate by my interpretation.    COORDINATION OF CARE: 8:50 PM Discussed treatment plan with pt at bedside which includes antibiotics, I&D and pt agreed to plan.   Medications Ordered in ED Medications - No data to display   Initial Impression / Assessment and Plan / ED Course  I have reviewed the triage vital signs and  the nursing notes.  Pertinent labs & imaging results that were available during my care of the patient were reviewed by me and considered in my medical decision making (see chart for details).  Clinical Course  No redness surrounding, no fevers  See I and D note - no signs of systemic infection - well appaering, bacdtrim outpatient f/u.  Final Clinical Impressions(s) / ED Diagnoses   Final diagnoses:  Abscess and cellulitis    New Prescriptions New Prescriptions   No medications on file    I personally performed the services described in this documentation, which was scribed in my presence. The recorded information has been reviewed and is accurate.        Eber HongBrian Daneli Butkiewicz, MD 06/27/16 2159

## 2016-06-27 NOTE — ED Triage Notes (Signed)
Pt thinks he had insect bite to L. Inner thigh area approximately 4 days ago. States he "popped" it several times and got some pus out but that it is back today. He also c/o nausea and sweating x last 2 hrs.

## 2016-07-01 ENCOUNTER — Emergency Department (HOSPITAL_COMMUNITY)
Admission: EM | Admit: 2016-07-01 | Discharge: 2016-07-02 | Disposition: A | Discharge status: Home / Self Care | Payer: Self-pay | Attending: Emergency Medicine | Admitting: Emergency Medicine

## 2016-07-01 ENCOUNTER — Encounter (HOSPITAL_COMMUNITY): Payer: Self-pay | Admitting: Emergency Medicine

## 2016-07-01 DIAGNOSIS — T7840XA Allergy, unspecified, initial encounter: Secondary | ICD-10-CM

## 2016-07-01 DIAGNOSIS — F17210 Nicotine dependence, cigarettes, uncomplicated: Secondary | ICD-10-CM | POA: Insufficient documentation

## 2016-07-01 DIAGNOSIS — R251 Tremor, unspecified: Secondary | ICD-10-CM | POA: Insufficient documentation

## 2016-07-01 DIAGNOSIS — T781XXA Other adverse food reactions, not elsewhere classified, initial encounter: Secondary | ICD-10-CM | POA: Insufficient documentation

## 2016-07-01 DIAGNOSIS — Z79899 Other long term (current) drug therapy: Secondary | ICD-10-CM | POA: Insufficient documentation

## 2016-07-01 DIAGNOSIS — Z7982 Long term (current) use of aspirin: Secondary | ICD-10-CM | POA: Insufficient documentation

## 2016-07-01 MED ORDER — METHYLPREDNISOLONE SODIUM SUCC 125 MG IJ SOLR
125.0000 mg | Freq: Once | INTRAMUSCULAR | Status: AC
  Administered 2016-07-01: 125 mg via INTRAVENOUS
  Filled 2016-07-01: qty 2

## 2016-07-01 MED ORDER — FAMOTIDINE IN NACL 20-0.9 MG/50ML-% IV SOLN
20.0000 mg | Freq: Once | INTRAVENOUS | Status: AC
  Administered 2016-07-01: 20 mg via INTRAVENOUS
  Filled 2016-07-01: qty 50

## 2016-07-01 MED ORDER — SODIUM CHLORIDE 0.9 % IV BOLUS (SEPSIS)
1000.0000 mL | Freq: Once | INTRAVENOUS | Status: AC
  Administered 2016-07-01: 1000 mL via INTRAVENOUS

## 2016-07-01 MED ORDER — DIPHENHYDRAMINE HCL 50 MG/ML IJ SOLN
12.5000 mg | Freq: Once | INTRAMUSCULAR | Status: AC
  Administered 2016-07-01: 12.5 mg via INTRAVENOUS
  Filled 2016-07-01: qty 1

## 2016-07-01 MED ORDER — EPINEPHRINE 0.3 MG/0.3ML IJ SOAJ
0.3000 mg | Freq: Once | INTRAMUSCULAR | Status: AC
  Administered 2016-07-01: 0.3 mg via INTRAMUSCULAR
  Filled 2016-07-01: qty 0.3

## 2016-07-01 NOTE — ED Triage Notes (Signed)
Pt states he started breaking out after eating 3 bites of a hamburger. Pt presents with generalized hives, dry mouth, shaking, and difficulty swallowing.

## 2016-07-01 NOTE — ED Notes (Signed)
Pt reports eating three bites of a hamburger and felt itching, short of breath and came to the ED. He denies recent tick bites, or beestings.  He complains of itching to his back and chest and groin areas, with feeling of a swollen tongue and throat.

## 2016-07-01 NOTE — ED Notes (Signed)
Dr Bebe ShaggyWickline in to reassess and speak with the patient

## 2016-07-01 NOTE — ED Provider Notes (Signed)
[facility]PT Provider Note   CSN: [id number] Arrival date & time: 07/01/16  2102 By signing my name below, I, Bridgette [id number] Tan, attest that this documentation has been prepared under the direction and in the presence of Zadie Rhineonald Lehua Flores, MD. Electronically Signed: Bridgette HabermannMaria Tan, ED Scribe. 07/01/16. 9:54 PM.  History   Chief Complaint Chief Complaint  Patient presents with  . Allergic Reaction   HPI Comments: Marvin Schroeder is a 40 y.o. male who presents to the Emergency Department complaining of sudden onset, constant, generalized hives onset tonight after eating 3 bites of a hamburger just PTA. Pt also has associated difficulty swallowing, dry mouth, chest pain, blurry vision, diaphoresis, arthralgias, and tremors. No LOC. No alleviating factors tried  PTA. 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 Denies any new medications. Pt denies fever.   The history is provided by the patient. No language interpreter was used.  Allergic Reaction  Presenting symptoms: difficulty breathing, difficulty swallowing, itching and rash   Severity:  Moderate Prior allergic episodes:  Insect allergies Context: food   Relieved by:  None tried Worsened by:  Nothing Ineffective treatments:  None tried   Past Medical History:  Diagnosis Date  . Back pain     There are no active problems to display for this patient.   Past Surgical History:  Procedure Laterality Date  . collarbone    . HAND SURGERY    . ORTHOPEDIC SURGERY       Home Medications    Prior to Admission medications   Medication Sig Start Date End Date Taking? Authorizing Provider  Aspirin-Salicylamide-Caffeine (BC HEADACHE POWDER PO) Take 1 packet by mouth as needed (for headache/pain).    Yes Historical Provider, MD  cholecalciferol (VITAMIN D) 1000 units tablet Take 1,000 Units by mouth daily.   Yes Historical Provider, MD  Naproxen Sod-Diphenhydramine (ALEVE PM) 220-25 MG TABS Take 1-2 tablets by mouth daily  as needed (for pain/sleep).   Yes Historical Provider, MD  vitamin B-12 (CYANOCOBALAMIN) 1000 MCG tablet Take 1,000 mcg by mouth daily.   Yes Historical Provider, MD  vitamin C (ASCORBIC ACID) 500 MG tablet Take 1,000 mg by mouth daily.   Yes Historical Provider, MD  ibuprofen (ADVIL,MOTRIN) 800 MG tablet Take 1 tablet (800 mg total) by mouth 3 (three) times daily. Patient not taking: Reported on 07/01/2016 06/27/16   Eber HongBrian Miller, MD  promethazine (PHENERGAN) 25 MG tablet Take 1 tablet (25 mg total) by mouth every 6 (six) hours as needed for nausea or vomiting. Patient not taking: Reported on 07/01/2016 06/27/16   Eber HongBrian Miller, MD  sulfamethoxazole-trimethoprim (BACTRIM DS,SEPTRA DS) 800-160 MG tablet Take 1 tablet by mouth 2 (two) times daily. Patient not taking: Reported on 07/01/2016 06/27/16 07/04/16  Eber HongBrian Miller, MD    Family History History reviewed. No pertinent family history.  Social History Social History  Substance Use Topics  . Smoking status: Current Some Day Smoker    Packs/day: 0.50    Types: Cigarettes  . Smokeless tobacco: Never Used  . Alcohol use No     Allergies   Amoxicillin; Bee venom; Penicillins; and Xylocaine dental [lidocaine-epinephrine]   Review of Systems Review of Systems  Constitutional: Positive for diaphoresis. Negative for fever.  HENT: Positive for trouble swallowing.   Eyes: Positive for visual disturbance.  Cardiovascular: Positive for chest pain.  Musculoskeletal: Positive for arthralgias.  Skin: Positive for itching and rash.  Neurological: Positive for tremors.  All other systems reviewed and are  negative.  Physical Exam Updated Vital Signs BP 111/80 (BP Location: Right Arm)   Pulse 74   Resp 22   SpO2 94%   Physical Exam CONSTITUTIONAL: Well developed/well nourished HEAD: Normocephalic/atraumatic EYES: EOMI/PERRL ENMT: Mucous membranes moist, no angio edema, ? Muffled voice noted NECK: supple no meningeal signs SPINE/BACK:entire  spine nontender CV: S1/S2 noted, no murmurs/rubs/gallops noted LUNGS: Lungs are clear to auscultation bilaterally, no apparent distress ABDOMEN: soft, nontender, no rebound or guarding, bowel sounds noted throughout abdomen GU:no cva tenderness NEURO: Pt is awake/alert/appropriate, moves all extremitiesx4.  No facial droop.   EXTREMITIES: pulses normal/equal, full ROM SKIN: warm, color normal, diffuse urticaria noted PSYCH: no abnormalities of mood noted, alert and oriented to situation  ED Treatments / Results  DIAGNOSTIC STUDIES:   COORDINATION OF CARE: 9:40 PM Discussed treatment plan with pt at bedside which includes epi-pen and pt agreed to plan.  Labs (all labs ordered are listed, but only abnormal results are displayed) Labs Reviewed - No data to display  EKG  EKG Interpretation  Date/Time:  Wednesday July 01 2016 22:52:48 EDT Ventricular Rate:  51 PR Interval:    QRS Duration: 98 QT Interval:  410 QTC Calculation: 378 R Axis:   26 Text Interpretation:  Sinus rhythm Atrial premature complex Low voltage, precordial leads Probable anteroseptal infarct, old No previous ECGs available Confirmed by Bebe ShaggyWICKLINE  MD, Jayni Prescher ([id number]) on 07/01/2016 10:56:34 PM       Radiology No results found.  Procedures Procedures (including critical care time)  Medications Ordered in ED Medications  diphenhydrAMINE (BENADRYL) injection 12.5 mg (12.5 mg Intravenous Given 07/01/16 2116)  famotidine (PEPCID) IVPB 20 mg premix (0 mg Intravenous Stopped 07/01/16 2150)  methylPREDNISolone sodium succinate (SOLU-MEDROL) 125 mg/2 mL injection 125 mg (125 mg Intravenous Given 07/01/16 2116)  EPINEPHrine (EPI-PEN) injection 0.3 mg (0.3 mg Intramuscular Given 07/01/16 2211)  sodium chloride 0.9 % bolus 1,000 mL (0 mLs Intravenous Stopped 07/01/16 2357)     Initial Impression / Assessment and Plan / ED Course  I have reviewed the triage vital signs and the nursing notes.    Clinical Course     Pt here with allergic reaction with diffuse urticaria He had muffled voice but no signs of angioedema He responded well to medications He kept reporting cp/BP, EKG performed which was unremarkable and his CP/BP were reproducible His rash improved His voice improved I feel he is appropriate for d/c home We discussed strict return precautions He already has epipen at home for bee stings, I also give him a new Rx  Final Clinical Impressions(s) / ED Diagnoses   Final diagnoses:  Allergic reaction, initial encounter    New Prescriptions New Prescriptions   EPINEPHRINE (EPIPEN 2-PAK) 0.3 MG/0.3 ML IJ SOAJ INJECTION    Inject 0.3 mLs (0.3 mg total) into the muscle once.   PREDNISONE (DELTASONE) 50 MG TABLET    One tablet PO daily for 4 days  I personally performed the services described in this documentation, which was scribed in my presence. The recorded information has been reviewed and is accurate.        Zadie Rhineonald Mattia Osterman, MD 07/02/16 [phone]

## 2016-07-01 NOTE — ED Notes (Signed)
Patient states that his back pain is from an old injury to his buttocks. States that it is spasms from laying in one position too long.

## 2016-07-02 MED ORDER — PREDNISONE 50 MG PO TABS: ORAL_TABLET | ORAL | 0 refills | Status: DC

## 2016-07-02 MED ORDER — EPINEPHRINE 0.3 MG/0.3ML IJ SOAJ: 0.3000 mg | Freq: Once | INTRAMUSCULAR | 0 refills | Status: AC

## 2016-07-11 ENCOUNTER — Emergency Department (HOSPITAL_COMMUNITY): Payer: Self-pay

## 2016-07-11 ENCOUNTER — Encounter (HOSPITAL_COMMUNITY): Payer: Self-pay

## 2016-07-11 ENCOUNTER — Emergency Department (HOSPITAL_COMMUNITY)
Admission: EM | Admit: 2016-07-11 | Discharge: 2016-07-11 | Disposition: A | Discharge status: Home / Self Care | Payer: Self-pay | Attending: Emergency Medicine | Admitting: Emergency Medicine

## 2016-07-11 DIAGNOSIS — Z791 Long term (current) use of non-steroidal anti-inflammatories (NSAID): Secondary | ICD-10-CM | POA: Insufficient documentation

## 2016-07-11 DIAGNOSIS — Y9301 Activity, walking, marching and hiking: Secondary | ICD-10-CM | POA: Insufficient documentation

## 2016-07-11 DIAGNOSIS — S025XXA Fracture of tooth (traumatic), initial encounter for closed fracture: Secondary | ICD-10-CM | POA: Insufficient documentation

## 2016-07-11 DIAGNOSIS — Y92009 Unspecified place in unspecified non-institutional (private) residence as the place of occurrence of the external cause: Secondary | ICD-10-CM | POA: Insufficient documentation

## 2016-07-11 DIAGNOSIS — S0081XA Abrasion of other part of head, initial encounter: Secondary | ICD-10-CM | POA: Insufficient documentation

## 2016-07-11 DIAGNOSIS — Y999 Unspecified external cause status: Secondary | ICD-10-CM | POA: Insufficient documentation

## 2016-07-11 DIAGNOSIS — F17210 Nicotine dependence, cigarettes, uncomplicated: Secondary | ICD-10-CM | POA: Insufficient documentation

## 2016-07-11 MED ORDER — KETOROLAC TROMETHAMINE 30 MG/ML IJ SOLN
15.0000 mg | Freq: Once | INTRAMUSCULAR | Status: AC
  Administered 2016-07-11: 15 mg via INTRAMUSCULAR
  Filled 2016-07-11: qty 1

## 2016-07-11 MED ORDER — CYCLOBENZAPRINE HCL 10 MG PO TABS: 10.0000 mg | ORAL_TABLET | Freq: Two times a day (BID) | ORAL | 0 refills | Status: DC | PRN

## 2016-07-11 MED ORDER — IBUPROFEN 800 MG PO TABS
800.0000 mg | ORAL_TABLET | Freq: Once | ORAL | Status: AC
  Administered 2016-07-11: 800 mg via ORAL
  Filled 2016-07-11: qty 1
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
## 2016-09-16 ENCOUNTER — Emergency Department (HOSPITAL_COMMUNITY)
Admission: EM | Admit: 2016-09-16 | Discharge: 2016-09-16 | Disposition: A | Discharge status: Home / Self Care | Payer: Self-pay | Attending: Emergency Medicine | Admitting: Emergency Medicine

## 2016-09-16 ENCOUNTER — Encounter (HOSPITAL_COMMUNITY): Payer: Self-pay

## 2016-09-16 ENCOUNTER — Emergency Department (HOSPITAL_COMMUNITY): Payer: Self-pay

## 2016-09-16 DIAGNOSIS — Z791 Long term (current) use of non-steroidal anti-inflammatories (NSAID): Secondary | ICD-10-CM | POA: Insufficient documentation

## 2016-09-16 DIAGNOSIS — X500XXA Overexertion from strenuous movement or load, initial encounter: Secondary | ICD-10-CM | POA: Insufficient documentation

## 2016-09-16 DIAGNOSIS — M25511 Pain in right shoulder: Secondary | ICD-10-CM | POA: Insufficient documentation

## 2016-09-16 DIAGNOSIS — Y939 Activity, unspecified: Secondary | ICD-10-CM | POA: Insufficient documentation

## 2016-09-16 DIAGNOSIS — F17210 Nicotine dependence, cigarettes, uncomplicated: Secondary | ICD-10-CM | POA: Insufficient documentation

## 2016-09-16 DIAGNOSIS — Y999 Unspecified external cause status: Secondary | ICD-10-CM | POA: Insufficient documentation

## 2016-09-16 DIAGNOSIS — Z79899 Other long term (current) drug therapy: Secondary | ICD-10-CM | POA: Insufficient documentation

## 2016-09-16 DIAGNOSIS — Y92009 Unspecified place in unspecified non-institutional (private) residence as the place of occurrence of the external cause: Secondary | ICD-10-CM | POA: Insufficient documentation
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
## 2016-09-16 NOTE — Discharge Instructions (Signed)
Please wear the sling for comfort. Your x-rays were negative in the ER. He may take ibuprofen and Tylenol for pain at home. Follow-up with orthopedist as referred above. Also keep your appointment with her primary care physician tomorrow. Return to the ED if your symptoms worsen.

## 2016-09-16 NOTE — ED Triage Notes (Signed)
Reports of feeling something pop in right shoulder last night while at work.

## 2016-10-02 ENCOUNTER — Emergency Department (HOSPITAL_COMMUNITY)
Admission: EM | Admit: 2016-10-02 | Discharge: 2016-10-02 | Disposition: A | Discharge status: Home / Self Care | Payer: Self-pay | Attending: Emergency Medicine | Admitting: Emergency Medicine

## 2016-10-02 ENCOUNTER — Encounter (HOSPITAL_COMMUNITY): Payer: Self-pay

## 2016-10-02 DIAGNOSIS — F17210 Nicotine dependence, cigarettes, uncomplicated: Secondary | ICD-10-CM | POA: Insufficient documentation

## 2016-10-02 DIAGNOSIS — S80862A Insect bite (nonvenomous), left lower leg, initial encounter: Secondary | ICD-10-CM | POA: Insufficient documentation

## 2016-10-02 DIAGNOSIS — S80861A Insect bite (nonvenomous), right lower leg, initial encounter: Secondary | ICD-10-CM | POA: Insufficient documentation

## 2016-10-02 DIAGNOSIS — S50361A Insect bite (nonvenomous) of right elbow, initial encounter: Secondary | ICD-10-CM | POA: Insufficient documentation

## 2016-10-02 DIAGNOSIS — Y929 Unspecified place or not applicable: Secondary | ICD-10-CM | POA: Insufficient documentation

## 2016-10-02 DIAGNOSIS — W57XXXA Bitten or stung by nonvenomous insect and other nonvenomous arthropods, initial encounter: Secondary | ICD-10-CM | POA: Insufficient documentation

## 2016-10-02 DIAGNOSIS — Z5321 Procedure and treatment not carried out due to patient leaving prior to being seen by health care provider: Secondary | ICD-10-CM | POA: Insufficient documentation

## 2016-10-02 DIAGNOSIS — Y999 Unspecified external cause status: Secondary | ICD-10-CM | POA: Insufficient documentation

## 2016-10-02 DIAGNOSIS — Y939 Activity, unspecified: Secondary | ICD-10-CM | POA: Insufficient documentation

## 2016-10-02 DIAGNOSIS — S50362A Insect bite (nonvenomous) of left elbow, initial encounter: Secondary | ICD-10-CM | POA: Insufficient documentation

## 2016-10-02 NOTE — ED Triage Notes (Signed)
For the past couple of nights I have been bit by something.  A friend has bed bugs and it may be the same.  The back of my legs and my elbows are really bad. I have been using benadryl cream.

## 2016-10-02 NOTE — ED Notes (Signed)
Pt states he needs to go to work, signed out ama for now, states he will return after he gets off of work if he is still itching.

## 2016-10-04 ENCOUNTER — Encounter (HOSPITAL_COMMUNITY): Payer: Self-pay | Admitting: Emergency Medicine

## 2016-10-04 ENCOUNTER — Emergency Department (HOSPITAL_COMMUNITY)
Admission: EM | Admit: 2016-10-04 | Discharge: 2016-10-04 | Disposition: A | Discharge status: Home / Self Care | Payer: No Typology Code available for payment source | Attending: Dermatology | Admitting: Dermatology

## 2016-10-04 DIAGNOSIS — S30860A Insect bite (nonvenomous) of lower back and pelvis, initial encounter: Secondary | ICD-10-CM | POA: Diagnosis not present

## 2016-10-04 DIAGNOSIS — W57XXXA Bitten or stung by nonvenomous insect and other nonvenomous arthropods, initial encounter: Secondary | ICD-10-CM | POA: Insufficient documentation

## 2016-10-04 DIAGNOSIS — S40862A Insect bite (nonvenomous) of left upper arm, initial encounter: Secondary | ICD-10-CM | POA: Diagnosis not present

## 2016-10-04 DIAGNOSIS — F17210 Nicotine dependence, cigarettes, uncomplicated: Secondary | ICD-10-CM | POA: Diagnosis not present

## 2016-10-04 DIAGNOSIS — S80861A Insect bite (nonvenomous), right lower leg, initial encounter: Secondary | ICD-10-CM | POA: Insufficient documentation

## 2016-10-04 DIAGNOSIS — Z79899 Other long term (current) drug therapy: Secondary | ICD-10-CM | POA: Diagnosis not present

## 2016-10-04 DIAGNOSIS — Z5321 Procedure and treatment not carried out due to patient leaving prior to being seen by health care provider: Secondary | ICD-10-CM | POA: Diagnosis not present

## 2016-10-04 DIAGNOSIS — Y999 Unspecified external cause status: Secondary | ICD-10-CM | POA: Insufficient documentation

## 2016-10-04 DIAGNOSIS — Y929 Unspecified place or not applicable: Secondary | ICD-10-CM | POA: Diagnosis not present

## 2016-10-04 DIAGNOSIS — S40861A Insect bite (nonvenomous) of right upper arm, initial encounter: Secondary | ICD-10-CM | POA: Diagnosis not present

## 2016-10-04 DIAGNOSIS — Y939 Activity, unspecified: Secondary | ICD-10-CM | POA: Diagnosis not present

## 2016-10-04 DIAGNOSIS — S80862A Insect bite (nonvenomous), left lower leg, initial encounter: Secondary | ICD-10-CM | POA: Insufficient documentation

## 2016-10-04 NOTE — ED Triage Notes (Signed)
Pt called for room several times with no answer. Pt not present in waiting area.

## 2016-10-04 NOTE — ED Triage Notes (Signed)
Pt states he has bug bites to the back of his legs, buttocks and arms. Pt reports he has been staying with a friend.

## 2016-10-04 NOTE — ED Triage Notes (Signed)
Patient called x 2 for room. No answer. 

## 2016-10-08 ENCOUNTER — Encounter (HOSPITAL_COMMUNITY): Payer: Self-pay | Admitting: *Deleted

## 2016-10-08 ENCOUNTER — Emergency Department (HOSPITAL_COMMUNITY)
Admission: EM | Admit: 2016-10-08 | Discharge: 2016-10-08 | Disposition: A | Discharge status: Home / Self Care | Payer: Self-pay | Attending: Emergency Medicine | Admitting: Emergency Medicine

## 2016-10-08 DIAGNOSIS — Y929 Unspecified place or not applicable: Secondary | ICD-10-CM | POA: Insufficient documentation

## 2016-10-08 DIAGNOSIS — S40862A Insect bite (nonvenomous) of left upper arm, initial encounter: Secondary | ICD-10-CM | POA: Insufficient documentation

## 2016-10-08 DIAGNOSIS — Y939 Activity, unspecified: Secondary | ICD-10-CM | POA: Insufficient documentation

## 2016-10-08 DIAGNOSIS — S80861A Insect bite (nonvenomous), right lower leg, initial encounter: Secondary | ICD-10-CM | POA: Insufficient documentation

## 2016-10-08 DIAGNOSIS — W57XXXA Bitten or stung by nonvenomous insect and other nonvenomous arthropods, initial encounter: Secondary | ICD-10-CM | POA: Insufficient documentation

## 2016-10-08 DIAGNOSIS — S40861A Insect bite (nonvenomous) of right upper arm, initial encounter: Secondary | ICD-10-CM | POA: Insufficient documentation

## 2016-10-08 DIAGNOSIS — F17210 Nicotine dependence, cigarettes, uncomplicated: Secondary | ICD-10-CM | POA: Insufficient documentation

## 2016-10-08 DIAGNOSIS — S80862A Insect bite (nonvenomous), left lower leg, initial encounter: Secondary | ICD-10-CM | POA: Insufficient documentation

## 2016-10-08 DIAGNOSIS — Y999 Unspecified external cause status: Secondary | ICD-10-CM | POA: Insufficient documentation

## 2016-10-08 MED ORDER — TRIAMCINOLONE ACETONIDE 0.1 % EX OINT: 1.0000 "application " | TOPICAL_OINTMENT | Freq: Two times a day (BID) | CUTANEOUS | 0 refills | Status: DC

## 2016-10-08 NOTE — ED Triage Notes (Signed)
New mattress 2 weeks ago, bug bites 4 days ago.  Pt states that he has seen bugs in bed.

## 2016-10-08 NOTE — ED Provider Notes (Signed)
[facility]-EMERG[facility]Y DEPT Provider Note   CSN: [id number] Arrival date & time: 10/08/16  1907 By signing my name below, I, Linus GalasMaharshi Patel, attest that this documentation has been prepared under the direction and in the presence of Eber HongBrian Fidelis Loth, MD. Electronically Signed: Linus GalasMaharshi Patel, ED Scribe. 10/08/16. 7:59 PM.   History   Chief Complaint Chief Complaint  Patient presents with  . Rash   The history is provided by the patient. No language interpreter was used.   HPI Comments: Marvin Schroeder is a 40 y.o. male who presents to the Emergency Department with no pertinent PMHx complaining of an ongoing diffuse rash with associated itching and erythema that began 6 days ago. Pt suspects his rash is due to a bed bug problem he has been having. He has been using OTC hydrocortisone and benadryl ointments with no relief. Pt denies any fevers, chills, N/V/D or other symptoms at this time.  Past Medical History:  Diagnosis Date  . Back pain    There are no active problems to display for this patient.  Past Surgical History:  Procedure Laterality Date  . collarbone    . HAND SURGERY    . ORTHOPEDIC SURGERY      Home Medications    Prior to Admission medications   Medication Sig Start Date End Date Taking? Authorizing Provider  cyclobenzaprine (FLEXERIL) 10 MG tablet Take 1 tablet (10 mg total) by mouth 2 (two) times daily as needed for muscle spasms. Patient not taking: Reported on 09/16/2016 07/11/16   Nelva Nayobert Beaton, MD  ibuprofen (ADVIL,MOTRIN) 800 MG tablet Take 1 tablet (800 mg total) by mouth 3 (three) times daily. Patient not taking: Reported on 07/01/2016 06/27/16   Eber HongBrian Mirtie Bastyr, MD   Family History Family History  Problem Relation Age of Onset  . Hypertension Other   . Cancer Other    Social History Social History  Substance Use Topics  . Smoking status: Current Some Day Smoker    Packs/day: 0.50    Types: Cigarettes  . Smokeless tobacco: Never Used  . Alcohol use No    Allergies   Amoxicillin; Bee venom; Penicillins; and Xylocaine dental [lidocaine-epinephrine]  Review of Systems Review of Systems  Constitutional: Negative for chills and fever.  Gastrointestinal: Negative for diarrhea, nausea and vomiting.  Skin: Positive for rash.   Physical Exam Updated Vital Signs BP 115/79 (BP Location: Left Arm)   Temp 99 F (37.2 C) (Temporal)   Resp 18   Ht 6' (1.829 m)   Wt 232 lb (105.2 kg)   SpO2 99%   BMI 31.46 kg/m   Physical Exam  Constitutional: He appears well-developed and well-nourished.  HENT:  Head: Normocephalic and atraumatic.  Eyes: Conjunctivae are normal. Right eye exhibits no discharge. Left eye exhibits no discharge.  Pulmonary/Chest: Effort normal. No respiratory distress.  Neurological: He is alert. Coordination normal.  Skin: Skin is warm and dry. Rash noted. He is not diaphoretic. No erythema.  Scabbed over red rash to arms and legs, no pet  No purp and no vesicles.  Psychiatric: He has a normal mood and affect.  Nursing note and vitals reviewed.  ED Treatments / Results  DIAGNOSTIC STUDIES: Oxygen Saturation is 99% on room air, normal by my interpretation.    COORDINATION OF CARE: 8:05 PM Discussed treatment plan with pt at bedside and pt agreed to plan.  Labs (all labs ordered are listed, but only abnormal results are displayed) Labs Reviewed - No data to display  EKG  EKG  Interpretation None      Radiology No results found.  Procedures Procedures (including critical care time)  Medications Ordered in ED Medications - No data to display  Initial Impression / Assessment and Plan / ED Course  I have reviewed the triage vital signs and the nursing notes.  Pertinent labs & imaging results that were available during my care of the patient were reviewed by me and considered in my medical decision making (see chart for details).  Clinical Course    Bed bugs bites  Final Clinical Impressions(s) / ED  Diagnoses   Final diagnoses:  Insect bite, initial encounter    New Prescriptions New Prescriptions   No medications on file   I personally performed the services described in this documentation, which was scribed in my presence. The recorded information has been reviewed and is accurate.       Eber HongBrian Braeley Buskey, MD 10/09/16 1240

## 2016-10-08 NOTE — Discharge Instructions (Signed)
Topical   Triamcinolone cream two or three times daily for 10 days as needed for itching These will get better by themselves over time Benadryl for itching Don't scratch these - they may get infected

## 2016-10-19 ENCOUNTER — Emergency Department (HOSPITAL_COMMUNITY): Payer: Self-pay

## 2016-10-19 ENCOUNTER — Encounter (HOSPITAL_COMMUNITY): Payer: Self-pay | Admitting: Emergency Medicine

## 2016-10-19 DIAGNOSIS — W208XXA Other cause of strike by thrown, projected or falling object, initial encounter: Secondary | ICD-10-CM | POA: Insufficient documentation

## 2016-10-19 DIAGNOSIS — Y939 Activity, unspecified: Secondary | ICD-10-CM | POA: Insufficient documentation

## 2016-10-19 DIAGNOSIS — Y999 Unspecified external cause status: Secondary | ICD-10-CM | POA: Insufficient documentation

## 2016-10-19 DIAGNOSIS — Z5321 Procedure and treatment not carried out due to patient leaving prior to being seen by health care provider: Secondary | ICD-10-CM | POA: Insufficient documentation

## 2016-10-19 DIAGNOSIS — Y929 Unspecified place or not applicable: Secondary | ICD-10-CM | POA: Insufficient documentation

## 2016-10-19 DIAGNOSIS — S99922A Unspecified injury of left foot, initial encounter: Secondary | ICD-10-CM | POA: Insufficient documentation

## 2016-10-19 DIAGNOSIS — Z791 Long term (current) use of non-steroidal anti-inflammatories (NSAID): Secondary | ICD-10-CM | POA: Insufficient documentation

## 2016-10-19 DIAGNOSIS — F17210 Nicotine dependence, cigarettes, uncomplicated: Secondary | ICD-10-CM | POA: Insufficient documentation
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
## 2016-10-20 ENCOUNTER — Emergency Department (HOSPITAL_COMMUNITY)
Admission: EM | Admit: 2016-10-20 | Discharge: 2016-10-20 | Disposition: A | Discharge status: Home / Self Care | Payer: Self-pay | Attending: Emergency Medicine | Admitting: Emergency Medicine

## 2016-10-20 NOTE — ED Notes (Signed)
Pt walked out, asked pt if he was leaving, pt would not answer.

## 2016-10-31 ENCOUNTER — Encounter (HOSPITAL_COMMUNITY): Payer: Self-pay | Admitting: Emergency Medicine

## 2016-10-31 ENCOUNTER — Emergency Department (HOSPITAL_COMMUNITY)
Admission: EM | Admit: 2016-10-31 | Discharge: 2016-10-31 | Disposition: A | Discharge status: Home / Self Care | Payer: Self-pay | Attending: Emergency Medicine | Admitting: Emergency Medicine

## 2016-10-31 DIAGNOSIS — M545 Low back pain: Secondary | ICD-10-CM | POA: Insufficient documentation

## 2016-10-31 DIAGNOSIS — F17210 Nicotine dependence, cigarettes, uncomplicated: Secondary | ICD-10-CM | POA: Insufficient documentation

## 2016-10-31 MED ORDER — METHOCARBAMOL 500 MG PO TABS: 500.0000 mg | ORAL_TABLET | Freq: Two times a day (BID) | ORAL | 0 refills | Status: DC

## 2016-10-31 MED ORDER — OXYCODONE-ACETAMINOPHEN 5-325 MG PO TABS
2.0000 | ORAL_TABLET | Freq: Once | ORAL | Status: AC
  Administered 2016-10-31: 2 via ORAL
  Filled 2016-10-31: qty 2

## 2016-10-31 MED ORDER — PREDNISONE 10 MG PO TABS: ORAL_TABLET | ORAL | 0 refills | Status: DC

## 2016-10-31 MED ORDER — PREDNISONE 10 MG PO TABS
60.0000 mg | ORAL_TABLET | Freq: Every day | ORAL | Status: DC
  Administered 2016-10-31: 60 mg via ORAL
  Filled 2016-10-31: qty 1

## 2016-10-31 NOTE — ED Triage Notes (Signed)
Pt reports flare up of chronic pain in his back and shoulder.
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
## 2016-11-03 ENCOUNTER — Encounter (HOSPITAL_COMMUNITY): Payer: Self-pay

## 2016-11-03 ENCOUNTER — Emergency Department (HOSPITAL_COMMUNITY)
Admission: EM | Admit: 2016-11-03 | Discharge: 2016-11-03 | Disposition: A | Discharge status: Home / Self Care | Payer: Self-pay | Attending: Emergency Medicine | Admitting: Emergency Medicine

## 2016-11-03 DIAGNOSIS — F17210 Nicotine dependence, cigarettes, uncomplicated: Secondary | ICD-10-CM | POA: Insufficient documentation

## 2016-11-03 DIAGNOSIS — G8929 Other chronic pain: Secondary | ICD-10-CM

## 2016-11-03 DIAGNOSIS — M5441 Lumbago with sciatica, right side: Secondary | ICD-10-CM | POA: Insufficient documentation

## 2016-11-03 MED ORDER — HYDROCODONE-ACETAMINOPHEN 5-325 MG PO TABS: 1.0000 | ORAL_TABLET | Freq: Four times a day (QID) | ORAL | 0 refills | Status: DC | PRN

## 2016-11-03 MED ORDER — HYDROCODONE-ACETAMINOPHEN 5-325 MG PO TABS
1.0000 | ORAL_TABLET | Freq: Once | ORAL | Status: AC
  Administered 2016-11-03: 1 via ORAL
  Filled 2016-11-03: qty 1

## 2016-11-03 NOTE — Discharge Instructions (Signed)
He will need to follow up with a primary care doctor.  Return here as needed

## 2016-11-03 NOTE — ED Triage Notes (Signed)
Reports of chronic back pain. States he thinks the cold weather is making it worse. States he can not afford to see PCP so he came today for pain medication.  Denies any recent injury.

## 2016-11-04 ENCOUNTER — Encounter (HOSPITAL_COMMUNITY): Payer: Self-pay | Admitting: Emergency Medicine

## 2016-11-04 ENCOUNTER — Emergency Department (HOSPITAL_COMMUNITY)
Admission: EM | Admit: 2016-11-04 | Discharge: 2016-11-04 | Disposition: A | Discharge status: Home / Self Care | Payer: Self-pay | Attending: Emergency Medicine | Admitting: Emergency Medicine

## 2016-11-04 DIAGNOSIS — F17210 Nicotine dependence, cigarettes, uncomplicated: Secondary | ICD-10-CM | POA: Insufficient documentation

## 2016-11-04 DIAGNOSIS — G8929 Other chronic pain: Secondary | ICD-10-CM | POA: Insufficient documentation

## 2016-11-04 DIAGNOSIS — M5441 Lumbago with sciatica, right side: Secondary | ICD-10-CM | POA: Insufficient documentation

## 2016-11-04 DIAGNOSIS — Z79899 Other long term (current) drug therapy: Secondary | ICD-10-CM | POA: Insufficient documentation

## 2016-11-04 MED ORDER — OXYCODONE-ACETAMINOPHEN 5-325 MG PO TABS
2.0000 | ORAL_TABLET | Freq: Once | ORAL | Status: AC
  Administered 2016-11-04: 2 via ORAL
  Filled 2016-11-04: qty 2

## 2016-11-04 NOTE — ED Provider Notes (Signed)
[facility] Provider Note   CSN: [id number] Arrival date & time: 11/04/16  1618     History   Chief Complaint Chief Complaint  Patient presents with  . Back Pain    HPI Marvin Schroeder is a 40 y.o. male.  HPI   Marvin Schroeder is a 40 y.o. male who presents to the Emergency Department complaining of Persistent right-sided low back pain. Patient has been seen here recently several times for the same. He states the medications he was prescribed is not helping control his pain. He describes a sharp stabbing pain that radiates from his right lower back into his right leg. Pain is worse with movement. He denies known injury, but does report that he does a lot of reaching and twisting at work. Pain radiates to the level of the knee. He denies abdominal pain, fever, chills, numbness or weakness of the lower extremities, urine or bowel changes.     Past Medical History:  Diagnosis Date  . Back pain     There are no active problems to display for this patient.   Past Surgical History:  Procedure Laterality Date  . collarbone    . HAND SURGERY    . ORTHOPEDIC SURGERY         Home Medications    Prior to Admission medications   Medication Sig Start Date End Date Taking? Authorizing Provider  HYDROcodone-acetaminophen (NORCO/VICODIN) 5-325 MG tablet Take 1 tablet by mouth every 6 (six) hours as needed for moderate pain. 11/03/16   Charlestine Nighthristopher Lawyer, PA-C  ibuprofen (ADVIL,MOTRIN) 800 MG tablet Take 1 tablet (800 mg total) by mouth 3 (three) times daily. Patient not taking: Reported on 11/04/2016 06/27/16   Eber HongBrian Miller, MD    Family History Family History  Problem Relation Age of Onset  . Hypertension Other   . Cancer Other     Social History Social History  Substance Use Topics  . Smoking status: Current Some Day Smoker    Packs/day: 0.50    Types: Cigarettes  . Smokeless tobacco: Never Used  . Alcohol use No     Allergies   Amoxicillin; Bee  venom; Penicillins; and Xylocaine dental [lidocaine-epinephrine]   Review of Systems Review of Systems  Constitutional: Negative for fever.  Respiratory: Negative for shortness of breath.   Gastrointestinal: Negative for abdominal pain, constipation and vomiting.  Genitourinary: Negative for decreased urine volume, difficulty urinating, dysuria, flank pain and hematuria.  Musculoskeletal: Positive for back pain. Negative for joint swelling.  Skin: Negative for rash.  Neurological: Negative for weakness and numbness.  All other systems reviewed and are negative.    Physical Exam Updated Vital Signs BP 129/75 (BP Location: Left Arm)   Pulse 86   Temp 98 F (36.7 C) (Oral)   Resp 17   SpO2 99%   Physical Exam  Constitutional: He is oriented to person, place, and time. He appears well-developed and well-nourished. No distress.  HENT:  Head: Normocephalic and atraumatic.  Mouth/Throat: Oropharynx is clear and moist.  Neck: Normal range of motion. Neck supple.  Cardiovascular: Normal rate, regular rhythm and intact distal pulses.   Pulmonary/Chest: Effort normal and breath sounds normal. No respiratory distress.  Abdominal: Soft. He exhibits no distension. There is no tenderness.  Musculoskeletal: He exhibits tenderness. He exhibits no edema.       Lumbar back: He exhibits tenderness and pain. He exhibits normal range of motion, no swelling, no deformity, no laceration and normal pulse.  ttp of the  lower lumbar spine and right paraspinal muscles.   DP pulses are brisk and symmetrical.  Distal sensation intact.  Pt has 5/5 strength against resistance of bilateral lower extremities.     Neurological: He is alert and oriented to person, place, and time. He has normal strength. No sensory deficit. He exhibits normal muscle tone. Coordination and gait normal.  Skin: Skin is warm and dry. No rash noted.  Nursing note and vitals reviewed.    ED Treatments / Results  Labs (all labs  ordered are listed, but only abnormal results are displayed) Labs Reviewed - No data to display  EKG  EKG Interpretation None       Radiology No results found.  Procedures Procedures (including critical care time)  Medications Ordered in ED Medications  oxyCODONE-acetaminophen (PERCOCET/ROXICET) 5-325 MG per tablet 2 tablet (not administered)     Initial Impression / Assessment and Plan / ED Course  I have reviewed the triage vital signs and the nursing notes.  Pertinent labs & imaging results that were available during my care of the patient were reviewed by me and considered in my medical decision making (see chart for details).  Clinical Course    Patient here with recurrent low back pain. No focal neuro deficits on exam. Patient ambulates with steady gait. No concerning symptoms for emergent neurological process. I do not feel that further prescription medications are indicated at this time. Patient is currently taking prednisone and Robaxin and was prescribed a short course of narcotic medication recently. I discussed this with the patient and he verbalized understanding that he would not be receiving additional narcotic prescriptions. He appears stable for discharge   Final Clinical Impressions(s) / ED Diagnoses   Final diagnoses:  Chronic right-sided low back pain with right-sided sciatica    New Prescriptions New Prescriptions   No medications on file     Rosey Bathammy Kelbie Moro, PA-C 11/05/16 0005    Eber HongBrian Miller, MD 11/05/16 2051

## 2016-11-04 NOTE — ED Triage Notes (Signed)
Pt reports continuing chronic back pain, states "medications are not doing any good."

## 2016-11-04 NOTE — Discharge Instructions (Signed)
Continue taking your previously prescribed medications.  Call Dr. Mort SawyersHarrison's office to arrange a follow-up appt.

## 2016-11-06 NOTE — ED Provider Notes (Signed)
[facility]-EMERGENCY DEPT Provider Note   CSN: [id number] Arrival date & time: 11/03/16  1208     History   Chief Complaint Chief Complaint  Patient presents with  . Back Pain    HPI Marvin Schroeder is a 40 y.o. male.  HPI Patient presents to the emergency department with chronic back pain.  The patient states that his back pain has been worsening over the last few weeks.  He states that he needs some medications to try to help with his symptomsThe patient denies chest pain, shortness of breath, headache,blurred vision, neck pain, fever, cough, weakness, numbness, dizziness, anorexia, edema, abdominal pain, nausea, vomiting, diarrhea, rash, back pain, dysuria, hematemesis, bloody stool, near syncope, or syncope.  He states that he has radiation of the pain into his leg Past Medical History:  Diagnosis Date  . Back pain     There are no active problems to display for this patient.   Past Surgical History:  Procedure Laterality Date  . collarbone    . HAND SURGERY    . ORTHOPEDIC SURGERY         Home Medications    Prior to Admission medications   Medication Sig Start Date End Date Taking? Authorizing Provider  ibuprofen (ADVIL,MOTRIN) 800 MG tablet Take 1 tablet (800 mg total) by mouth 3 (three) times daily. Patient not taking: Reported on 11/04/2016 06/27/16  Yes Eber HongBrian Miller, MD  HYDROcodone-acetaminophen (NORCO/VICODIN) 5-325 MG tablet Take 1 tablet by mouth every 6 (six) hours as needed for moderate pain. 11/03/16   Charlestine Nighthristopher Sherley Leser, PA-C    Family History Family History  Problem Relation Age of Onset  . Hypertension Other   . Cancer Other     Social History Social History  Substance Use Topics  . Smoking status: Current Some Day Smoker    Packs/day: 0.50    Types: Cigarettes  . Smokeless tobacco: Never Used  . Alcohol use No     Allergies   Amoxicillin; Bee venom; Penicillins; and Xylocaine dental [lidocaine-epinephrine]   Review of  Systems Review of Systems All other systems negative except as documented in the HPI. All pertinent positives and negatives as reviewed in the HPI.  Physical Exam Updated Vital Signs BP 134/90   Pulse 67   Temp 98.1 F (36.7 C) (Oral)   Resp 17   Ht 6' (1.829 m)   Wt 103.4 kg   SpO2 100%   BMI 30.92 kg/m   Physical Exam  Constitutional: He is oriented to person, place, and time. He appears well-developed and well-nourished. No distress.  HENT:  Head: Normocephalic and atraumatic.  Eyes: Pupils are equal, round, and reactive to light.  Pulmonary/Chest: Effort normal.  Musculoskeletal:       Lumbar back: He exhibits tenderness and pain. He exhibits normal range of motion, no bony tenderness, no swelling, no deformity and no spasm.       Back:  Neurological: He is alert and oriented to person, place, and time.  Skin: Skin is warm and dry.  Psychiatric: He has a normal mood and affect.  Nursing note and vitals reviewed.    ED Treatments / Results  Labs (all labs ordered are listed, but only abnormal results are displayed) Labs Reviewed - No data to display  EKG  EKG Interpretation None       Radiology No results found.  Procedures Procedures (including critical care time)  Medications Ordered in ED Medications  HYDROcodone-acetaminophen (NORCO/VICODIN) 5-325 MG per tablet 1 tablet (1 tablet  Oral Given 11/03/16 1347)     Initial Impression / Assessment and Plan / ED Course  I have reviewed the triage vital signs and the nursing notes.  Pertinent labs & imaging results that were available during my care of the patient were reviewed by me and considered in my medical decision making (see chart for details).  Clinical Course     Patient has no neurological deficits noted on exam and advised him that he will need to follow-up with the specialist.  Told to return here as needed.  Patient agrees the plan and all questions were answered  Final Clinical  Impressions(s) / ED Diagnoses   Final diagnoses:  Chronic right-sided low back pain with right-sided sciatica    New Prescriptions Discharge Medication List as of 11/03/2016  1:32 PM    START taking these medications   Details  HYDROcodone-acetaminophen (NORCO/VICODIN) 5-325 MG tablet Take 1 tablet by mouth every 6 (six) hours as needed for moderate pain., Starting Tue 11/03/2016, Print         Eli Lilly and CompanyChristopher Negin Hegg, PA-C 11/06/16 1653    Blane OharaJoshua Zavitz, MD 11/07/16 [id number]

## 2016-11-07 ENCOUNTER — Encounter (HOSPITAL_COMMUNITY): Payer: Self-pay | Admitting: Emergency Medicine

## 2016-11-07 ENCOUNTER — Emergency Department (HOSPITAL_COMMUNITY)
Admission: EM | Admit: 2016-11-07 | Discharge: 2016-11-07 | Disposition: A | Discharge status: Home / Self Care | Payer: Self-pay | Attending: Emergency Medicine | Admitting: Emergency Medicine

## 2016-11-07 DIAGNOSIS — Z79899 Other long term (current) drug therapy: Secondary | ICD-10-CM | POA: Insufficient documentation

## 2016-11-07 DIAGNOSIS — M5431 Sciatica, right side: Secondary | ICD-10-CM

## 2016-11-07 DIAGNOSIS — M5441 Lumbago with sciatica, right side: Secondary | ICD-10-CM | POA: Insufficient documentation

## 2016-11-07 DIAGNOSIS — F17210 Nicotine dependence, cigarettes, uncomplicated: Secondary | ICD-10-CM | POA: Insufficient documentation

## 2016-11-07 LAB — I-STAT CHEM 8, ED
BUN: 18 mg/dL (ref 6–20)
CALCIUM ION: 1.18 mmol/L (ref 1.15–1.40)
Chloride: 103 mmol/L (ref 101–111)
Creatinine, Ser: 1.1 mg/dL (ref 0.61–1.24)
GLUCOSE: 100 mg/dL — AB (ref 65–99)
HCT: 45 % (ref 39.0–52.0)
Hemoglobin: 15.3 g/dL (ref 13.0–17.0)
Potassium: 4.6 mmol/L (ref 3.5–5.1)
SODIUM: 139 mmol/L (ref 135–145)
TCO2: 26 mmol/L (ref 0–100)

## 2016-11-07 MED ORDER — OXYCODONE-ACETAMINOPHEN 5-325 MG PO TABS: 1.0000 | ORAL_TABLET | Freq: Four times a day (QID) | ORAL | 0 refills | Status: DC | PRN

## 2016-11-07 MED ORDER — METHYLPREDNISOLONE SODIUM SUCC 125 MG IJ SOLR
125.0000 mg | Freq: Once | INTRAMUSCULAR | Status: AC
  Administered 2016-11-07: 125 mg via INTRAVENOUS
  Filled 2016-11-07: qty 2

## 2016-11-07 MED ORDER — ONDANSETRON HCL 4 MG/2ML IJ SOLN
4.0000 mg | Freq: Once | INTRAMUSCULAR | Status: AC
  Administered 2016-11-07: 4 mg via INTRAVENOUS
  Filled 2016-11-07: qty 2

## 2016-11-07 MED ORDER — HYDROMORPHONE HCL 1 MG/ML IJ SOLN
1.0000 mg | Freq: Once | INTRAMUSCULAR | Status: AC
  Administered 2016-11-07: 1 mg via INTRAVENOUS
  Filled 2016-11-07: qty 1

## 2016-11-07 NOTE — ED Triage Notes (Signed)
Pt returns stating that chronic back pain is not improving despite prescribed medications. States "I've been here 3 times this week".

## 2016-11-07 NOTE — ED Notes (Addendum)
Pt lying in bed on phone with no distress noted.

## 2016-11-07 NOTE — Discharge Instructions (Signed)
No lifting over 10 pounds for a week. When you finish the prednisone U can start back on taking the ibuprofen 3 times a day. Follow-up with Dr. Romeo AppleHarrison orthopedic surgeon

## 2016-11-07 NOTE — ED Provider Notes (Signed)
[facility] DEPT Provider Note   CSN: [id number] Arrival date & time: 11/07/16  1546     History   Chief Complaint Chief Complaint  Patient presents with  . Back Pain    HPI Marvin Schroeder is a 40 y.o. male.  Patient complains of back pain with radiating down his right leg. Patient has a history of ruptured disc year ago. He has been on prednisone for muscle relaxer and occasional Vicodin. Patient states that medicine is not helping   The history is provided by the patient. No language interpreter was used.  Back Pain   This is a recurrent problem. The current episode started more than 2 days ago. The problem occurs constantly. The problem has not changed since onset.The pain is associated with no known injury. The pain is present in the lumbar spine. The quality of the pain is described as stabbing. The pain radiates to the right thigh. The pain is at a severity of 8/10. Pertinent negatives include no chest pain, no headaches and no abdominal pain.    Past Medical History:  Diagnosis Date  . Back pain     There are no active problems to display for this patient.   Past Surgical History:  Procedure Laterality Date  . collarbone    . HAND SURGERY    . ORTHOPEDIC SURGERY         Home Medications    Prior to Admission medications   Medication Sig Start Date End Date Taking? Authorizing Provider  HYDROcodone-acetaminophen (NORCO/VICODIN) 5-325 MG tablet Take 1 tablet by mouth every 6 (six) hours as needed for moderate pain. 11/03/16  Yes Christopher Lawyer, PA-C  ibuprofen (ADVIL,MOTRIN) 800 MG tablet Take 1 tablet (800 mg total) by mouth 3 (three) times daily. Patient not taking: Reported on 11/04/2016 06/27/16   Eber HongBrian Miller, MD  oxyCODONE-acetaminophen (PERCOCET/ROXICET) 5-325 MG tablet Take 1 tablet by mouth every 6 (six) hours as needed. 11/07/16   Bethann BerkshireJoseph Leoma Folds, MD    Family History Family History  Problem Relation Age of Onset  . Hypertension Other     . Cancer Other     Social History Social History  Substance Use Topics  . Smoking status: Current Some Day Smoker    Packs/day: 0.50    Types: Cigarettes  . Smokeless tobacco: Never Used  . Alcohol use No     Allergies   Amoxicillin; Bee venom; Penicillins; and Xylocaine dental [lidocaine-epinephrine]   Review of Systems Review of Systems  Constitutional: Negative for appetite change and fatigue.  HENT: Negative for congestion, ear discharge and sinus pressure.   Eyes: Negative for discharge.  Respiratory: Negative for cough.   Cardiovascular: Negative for chest pain.  Gastrointestinal: Negative for abdominal pain and diarrhea.  Genitourinary: Negative for frequency and hematuria.  Musculoskeletal: Positive for back pain.  Skin: Negative for rash.  Neurological: Negative for seizures and headaches.  Psychiatric/Behavioral: Negative for hallucinations.     Physical Exam Updated Vital Signs BP 115/68   Pulse 78   Temp 98.4 F (36.9 C) (Oral)   Resp 16   Ht 6' (1.829 m)   Wt 228 lb (103.4 kg)   SpO2 100%   BMI 30.92 kg/m   Physical Exam  Constitutional: He is oriented to person, place, and time. He appears well-developed.  HENT:  Head: Normocephalic.  Eyes: Conjunctivae and EOM are normal. No scleral icterus.  Neck: Neck supple. No thyromegaly present.  Cardiovascular: Normal rate and regular rhythm.  Exam reveals no gallop  and no friction rub.   No murmur heard. Pulmonary/Chest: No stridor. He has no wheezes. He has no rales. He exhibits no tenderness.  Abdominal: He exhibits no distension. There is no tenderness. There is no rebound.  Musculoskeletal: Normal range of motion. He exhibits no edema.  Tender lumbar spine  Lymphadenopathy:    He has no cervical adenopathy.  Neurological: He is oriented to person, place, and time. He exhibits normal muscle tone. Coordination normal.  Patient has straight leg raise pain on the right side. Strength normal and  lower extremities  Skin: No rash noted. No erythema.  Psychiatric: He has a normal mood and affect. His behavior is normal.     ED Treatments / Results  Labs (all labs ordered are listed, but only abnormal results are displayed) Labs Reviewed  I-STAT CHEM 8, ED - Abnormal; Notable for the following:       Result Value   Glucose, Bld 100 (*)    All other components within normal limits    EKG  EKG Interpretation None       Radiology No results found.  Procedures Procedures (including critical care time)  Medications Ordered in ED Medications  HYDROmorphone (DILAUDID) injection 1 mg (1 mg Intravenous Given 11/07/16 1656)  ondansetron (ZOFRAN) injection 4 mg (4 mg Intravenous Given 11/07/16 1656)  methylPREDNISolone sodium succinate (SOLU-MEDROL) 125 mg/2 mL injection 125 mg (125 mg Intravenous Given 11/07/16 1656)     Initial Impression / Assessment and Plan / ED Course  I have reviewed the triage vital signs and the nursing notes.  Pertinent labs & imaging results that were available during my care of the patient were reviewed by me and considered in my medical decision making (see chart for details).  Clinical Course     Patient with back pain and sciatica. We will add Percocet to his prednisone and muscle relaxer. He is told not to do any lifting for a week. He is referred to orthopedic surgeon Dr. Romeo AppleHarrison  Final Clinical Impressions(s) / ED Diagnoses   Final diagnoses:  Sciatica of right side    New Prescriptions New Prescriptions   OXYCODONE-ACETAMINOPHEN (PERCOCET/ROXICET) 5-325 MG TABLET    Take 1 tablet by mouth every 6 (six) hours as needed.     Bethann BerkshireJoseph Deshawnda Acrey, MD 11/07/16 [phone]

## 2016-11-16 ENCOUNTER — Emergency Department (HOSPITAL_COMMUNITY)
Admission: EM | Admit: 2016-11-16 | Discharge: 2016-11-16 | Disposition: A | Discharge status: Home / Self Care | Payer: Self-pay | Attending: Emergency Medicine | Admitting: Emergency Medicine

## 2016-11-16 ENCOUNTER — Encounter (HOSPITAL_COMMUNITY): Payer: Self-pay | Admitting: Emergency Medicine

## 2016-11-16 DIAGNOSIS — Y929 Unspecified place or not applicable: Secondary | ICD-10-CM | POA: Insufficient documentation

## 2016-11-16 DIAGNOSIS — F17210 Nicotine dependence, cigarettes, uncomplicated: Secondary | ICD-10-CM | POA: Insufficient documentation

## 2016-11-16 DIAGNOSIS — T148XXA Other injury of unspecified body region, initial encounter: Secondary | ICD-10-CM

## 2016-11-16 DIAGNOSIS — Y999 Unspecified external cause status: Secondary | ICD-10-CM | POA: Insufficient documentation

## 2016-11-16 DIAGNOSIS — X501XXA Overexertion from prolonged static or awkward postures, initial encounter: Secondary | ICD-10-CM | POA: Insufficient documentation

## 2016-11-16 DIAGNOSIS — M5432 Sciatica, left side: Secondary | ICD-10-CM

## 2016-11-16 DIAGNOSIS — R51 Headache: Secondary | ICD-10-CM | POA: Insufficient documentation

## 2016-11-16 DIAGNOSIS — Y939 Activity, unspecified: Secondary | ICD-10-CM | POA: Insufficient documentation

## 2016-11-16 DIAGNOSIS — S39012A Strain of muscle, fascia and tendon of lower back, initial encounter: Secondary | ICD-10-CM | POA: Insufficient documentation

## 2016-11-16 DIAGNOSIS — M5442 Lumbago with sciatica, left side: Secondary | ICD-10-CM | POA: Insufficient documentation

## 2016-11-16 MED ORDER — DEXAMETHASONE 4 MG PO TABS: 4.0000 mg | ORAL_TABLET | Freq: Two times a day (BID) | ORAL | 0 refills | Status: DC

## 2016-11-16 MED ORDER — DIAZEPAM 5 MG PO TABS
10.0000 mg | ORAL_TABLET | Freq: Once | ORAL | Status: AC
  Administered 2016-11-16: 10 mg via ORAL
  Filled 2016-11-16: qty 2

## 2016-11-16 MED ORDER — DEXAMETHASONE SODIUM PHOSPHATE 4 MG/ML IJ SOLN
8.0000 mg | Freq: Once | INTRAMUSCULAR | Status: AC
Start: 2016-11-16 — End: 2016-11-16
  Administered 2016-11-16: 8 mg via INTRAMUSCULAR
  Filled 2016-11-16: qty 2

## 2016-11-16 MED ORDER — DICLOFENAC SODIUM 75 MG PO TBEC: 75.0000 mg | DELAYED_RELEASE_TABLET | Freq: Two times a day (BID) | ORAL | 0 refills | Status: DC

## 2016-11-16 MED ORDER — KETOROLAC TROMETHAMINE 10 MG PO TABS
10.0000 mg | ORAL_TABLET | Freq: Once | ORAL | Status: AC
  Administered 2016-11-16: 10 mg via ORAL
  Filled 2016-11-16: qty 1

## 2016-11-16 MED ORDER — CYCLOBENZAPRINE HCL 10 MG PO TABS: 10.0000 mg | ORAL_TABLET | Freq: Three times a day (TID) | ORAL | 0 refills | Status: DC

## 2016-11-16 NOTE — ED Notes (Signed)
Patient left without receiving discharge instructions or signing 

## 2016-11-16 NOTE — Discharge Instructions (Signed)
You've been given a voucher to see Dr. Romeo AppleHarrison for orthopedic assessment of your back and for management of the same. Please see Dr. Romeo AppleHarrison as soon as possible concerning your back. Please use Decadron and diclofenac 2 times daily with food. Use Flexeril 3 times daily for spasm pain. This medication may cause drowsiness, please use with caution.

## 2016-11-16 NOTE — ED Provider Notes (Addendum)
Patient seen by me along with the physician assistant.  Patient with multiple visits the in the month of December for back pain. Patient did have MRI year ago. Patient without any focal neuro deficits. Patient requesting narcotic pain medicine.   Patient offered nonnarcotic medications.   Patient not satisfied stated [place] [facility].  Patient given referral to orthopedics Dr. Mort SawyersHarrison's office for follow-up. Patient stable for discharge home.   EKG Interpretation None      Medical screening examination/treatment/procedure(s) were conducted as a shared visit with non-physician practitioner(s) and myself.  I personally evaluated the patient during the encounter.   EKG Interpretation None        Vanetta MuldersScott Nikoloz Huy, MD 11/16/16 [id number]    Vanetta MuldersScott Priyal Musquiz, MD 11/16/16 [phone]

## 2016-11-16 NOTE — ED Notes (Signed)
Dr. Zackowski at bedside  

## 2016-11-16 NOTE — ED Triage Notes (Signed)
Pt reports flare of chronic back pain going down legs.

## 2016-11-16 NOTE — ED Provider Notes (Signed)
[facility] Provider Note   CSN: [id number] Arrival date & time: 11/16/16  1645  By signing my name below, I, Alyssa Grove, attest that this documentation has been prepared under the direction and in the presence of Ivery Quale, PA-C. Electronically Signed: Alyssa Grove, ED Scribe. 11/16/16. 5:22 PM.  History   Chief Complaint Chief Complaint  Patient presents with  . Back Pain   The history is provided by the patient. No language interpreter was used.   HPI Comments: Marvin Schroeder is a 41 y.o. male who presents to the Emergency Department complaining of chronic, progressively worsening, constant, moderate right lower back pain worsening this morning. Pt had right lower back numbness and pain that has gradually worsened to include his left lower back as well. Pain radiates down his legs bilaterally and is exacerbated with movement, standing, deep inhalation, positional changes and twisting of the back. He reports associated headache. His job requires him to lift over 45 lbs as well as twist. Pt states last Ed treatment was able to alleviate pain and numbness sensation.  Past Medical History:  Diagnosis Date  . Back pain     There are no active problems to display for this patient.   Past Surgical History:  Procedure Laterality Date  . collarbone    . HAND SURGERY    . ORTHOPEDIC SURGERY      Home Medications    Prior to Admission medications   Medication Sig Start Date End Date Taking? Authorizing Provider  HYDROcodone-acetaminophen (NORCO/VICODIN) 5-325 MG tablet Take 1 tablet by mouth every 6 (six) hours as needed for moderate pain. 11/03/16   Charlestine Night, PA-C  ibuprofen (ADVIL,MOTRIN) 800 MG tablet Take 1 tablet (800 mg total) by mouth 3 (three) times daily. Patient not taking: Reported on 11/04/2016 06/27/16   Eber Hong, MD  oxyCODONE-acetaminophen (PERCOCET/ROXICET) 5-325 MG tablet Take 1 tablet by mouth every 6 (six) hours as needed. 11/07/16    Bethann Berkshire, MD    Family History Family History  Problem Relation Age of Onset  . Hypertension Other   . Cancer Other     Social History Social History  Substance Use Topics  . Smoking status: Current Some Day Smoker    Packs/day: 0.50    Types: Cigarettes  . Smokeless tobacco: Never Used  . Alcohol use No     Allergies   Amoxicillin; Bee venom; Penicillins; and Xylocaine dental [lidocaine-epinephrine]   Review of Systems Review of Systems  Musculoskeletal: Positive for arthralgias and back pain.  Neurological: Positive for numbness and headaches.  All other systems reviewed and are negative.  Physical Exam Updated Vital Signs BP (!) 159/102 (BP Location: Left Arm)   Pulse 80   Temp 97.5 F (36.4 C) (Oral)   Resp 18   Ht 6' (1.829 m)   Wt 228 lb (103.4 kg)   SpO2 100%   BMI 30.92 kg/m   Physical Exam  Constitutional: He is oriented to person, place, and time. He appears well-developed and well-nourished. He is active. No distress.  HENT:  Head: Normocephalic and atraumatic.  Eyes: Conjunctivae are normal.  Cardiovascular: Normal rate.   Pulmonary/Chest: Effort normal. No respiratory distress.  Musculoskeletal: Normal range of motion.  Paraspinal tenderness on the right and left from the thoracic spine down to the lumbar spine No palpable step offs of cervical, thoracic, or lumbar spine areas  Neurological: He is alert and oriented to person, place, and time.  No gross neurologic deficits appreciated  Skin: Skin is warm and dry.  Psychiatric: He has a normal mood and affect. His behavior is normal.  Nursing note and vitals reviewed.  ED Treatments / Results  DIAGNOSTIC STUDIES: Oxygen Saturation is 100% on RA, normal by my interpretation.    COORDINATION OF CARE: 5:11 PM Discussed treatment plan with pt at bedside which includes steroid, antiinflammatory and muscle relaxer medications and pt agreed to plan.  Labs (all labs ordered are listed, but  only abnormal results are displayed) Labs Reviewed - No data to display  EKG  EKG Interpretation None       Radiology No results found.  Procedures Procedures (including critical care time)  Medications Ordered in ED Medications - No data to display   Initial Impression / Assessment and Plan / ED Course  I have reviewed the triage vital signs and the nursing notes.  Pertinent labs & imaging results that were available during my care of the patient were reviewed by me and considered in my medical decision making (see chart for details).  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 No gross neurological deficits appreciated at this time, no cauda equina. Patient will be treated with steroid, antiinflammatory and muscle relaxer medications. Patient referred to orthopedics for additional evaluation and management. Discussed with the patient the importance of seeing orthopedic specialist for proper management of this issue. Pt acknowledges understanding of instructions. During discharge, the patient became upset that he was not receiving medications that he receive the last time he was here in the emergency department. He complained to the nurse that I do not give him medications that he wants. He demanded to see another doctor. I spoke with Dr. Deretha EmoryZackowski . He came down and spoke with the patient. The patient became even more upset said [facility]. He left without taking a copy of his discharge instructions or prescriptions.  Final diagnoses:  Left sided sciatica  Muscle strain    New Prescriptions New Prescriptions   No medications on file   **I personally performed the services described in this  documentation, which was scribed in my presence. The recorded information has been reviewed and is accurate.Ivery Quale*   Ceazia Harb, PA-C 11/16/16 1828    Vanetta MuldersScott Zackowski, MD 11/17/16 [phone]

## 2016-11-16 NOTE — ED Notes (Addendum)
Went in to discharge patient and he states "I want to see another doctor. Every time I see that doctor he never gives me anything to help me. That doctor the last time gave me percocet and it helps. I don't want that flexeril." Advised Ivery QualeHobson Bryant PA.

## 2016-12-05 ENCOUNTER — Emergency Department (HOSPITAL_COMMUNITY)
Admission: EM | Admit: 2016-12-05 | Discharge: 2016-12-05 | Discharge status: Against Medical Advice | Payer: No Typology Code available for payment source | Attending: Dermatology | Admitting: Dermatology

## 2016-12-05 ENCOUNTER — Encounter (HOSPITAL_COMMUNITY): Payer: Self-pay | Admitting: Emergency Medicine

## 2016-12-05 DIAGNOSIS — Z791 Long term (current) use of non-steroidal anti-inflammatories (NSAID): Secondary | ICD-10-CM | POA: Insufficient documentation

## 2016-12-05 DIAGNOSIS — M549 Dorsalgia, unspecified: Secondary | ICD-10-CM | POA: Insufficient documentation

## 2016-12-05 DIAGNOSIS — G8929 Other chronic pain: Secondary | ICD-10-CM | POA: Diagnosis not present

## 2016-12-05 DIAGNOSIS — F17210 Nicotine dependence, cigarettes, uncomplicated: Secondary | ICD-10-CM | POA: Insufficient documentation

## 2016-12-05 DIAGNOSIS — Z5321 Procedure and treatment not carried out due to patient leaving prior to being seen by health care provider: Secondary | ICD-10-CM | POA: Diagnosis not present

## 2016-12-05 NOTE — ED Notes (Signed)
First attempt made for calling pt back to a room.

## 2016-12-05 NOTE — ED Triage Notes (Signed)
Pt reports chronic back pain that has been worse x 2 weeks. Pt reports sharp shooting pains down right leg. Pt denies any new injuries.

## 2017-08-16 ENCOUNTER — Emergency Department (HOSPITAL_COMMUNITY)
Admission: EM | Admit: 2017-08-16 | Discharge: 2017-08-16 | Disposition: A | Discharge status: Home / Self Care | Payer: Self-pay | Attending: Emergency Medicine | Admitting: Emergency Medicine

## 2017-08-16 ENCOUNTER — Emergency Department (HOSPITAL_COMMUNITY): Payer: Self-pay

## 2017-08-16 ENCOUNTER — Encounter (HOSPITAL_COMMUNITY): Payer: Self-pay | Admitting: Emergency Medicine

## 2017-08-16 DIAGNOSIS — W108XXD Fall (on) (from) other stairs and steps, subsequent encounter: Secondary | ICD-10-CM | POA: Insufficient documentation

## 2017-08-16 DIAGNOSIS — F17210 Nicotine dependence, cigarettes, uncomplicated: Secondary | ICD-10-CM | POA: Insufficient documentation

## 2017-08-16 DIAGNOSIS — M79644 Pain in right finger(s): Secondary | ICD-10-CM | POA: Insufficient documentation

## 2017-08-16 DIAGNOSIS — Y9301 Activity, walking, marching and hiking: Secondary | ICD-10-CM | POA: Insufficient documentation

## 2017-08-16 DIAGNOSIS — W19XXXA Unspecified fall, initial encounter: Secondary | ICD-10-CM

## 2017-08-16 DIAGNOSIS — Y998 Other external cause status: Secondary | ICD-10-CM | POA: Insufficient documentation

## 2017-08-16 DIAGNOSIS — M79601 Pain in right arm: Secondary | ICD-10-CM | POA: Insufficient documentation

## 2017-08-16 DIAGNOSIS — Y929 Unspecified place or not applicable: Secondary | ICD-10-CM | POA: Insufficient documentation

## 2017-08-16 DIAGNOSIS — M25531 Pain in right wrist: Secondary | ICD-10-CM | POA: Insufficient documentation

## 2017-08-16 DIAGNOSIS — M25521 Pain in right elbow: Secondary | ICD-10-CM | POA: Insufficient documentation

## 2017-08-16 MED ORDER — ACETAMINOPHEN 325 MG PO TABS
650.0000 mg | ORAL_TABLET | Freq: Once | ORAL | Status: AC
  Administered 2017-08-16: 650 mg via ORAL
  Filled 2017-08-16: qty 2

## 2017-08-16 MED ORDER — OXYCODONE HCL 5 MG PO TABS: 5.0000 mg | ORAL_TABLET | Freq: Four times a day (QID) | ORAL | 0 refills | Status: AC | PRN

## 2017-08-16 MED ORDER — OXYCODONE-ACETAMINOPHEN 5-325 MG PO TABS
1.0000 | ORAL_TABLET | Freq: Once | ORAL | Status: AC
  Administered 2017-08-16: 1 via ORAL
  Filled 2017-08-16: qty 1

## 2017-08-16 NOTE — ED Notes (Signed)
Called pt for triage no answer 

## 2017-08-16 NOTE — Discharge Instructions (Signed)
Your right shoulder, elbow, wrist and hand x-rays did not new bony injuries or dislocations.   I suspect your pain is from soft tissue injuries or inflammation.  Wear your finger splint until you are evaluated by hand surgery. Call hand surgery for further evaluation. They will likely make further recommendations.   For pain: take 600 mg ibuprofen PLUS 1000 mg tylenol every 8 hours for mild/moderate pain. For break through our severe pain, add oxycodone 5 mg.  Oxycodone is a narcotic pain medication that has risk of overdose, death, dependence and abuse. Do not consume alcohol, drive or use heavy machinery while taking this medication. Do not leave unattended around children. The emergency department has a strict policy regarding prescription of narcotic medications. We are unable to refill this medication in the emergency department for chronic pain. Contact your primary care provider or specialist for chronic pain management and refill on narcotic medications.
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
## 2017-08-16 NOTE — ED Notes (Signed)
Pt at registration stating that he had left earlier, but is back and would like to be seen. Pt placed back in waiting room from [facility] (in computer) and directed to wait in area to be triaged
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
## 2017-08-16 NOTE — ED Notes (Signed)
Triage nurse called patient pt didn't answer will call him for him again

## 2017-08-31 ENCOUNTER — Emergency Department (HOSPITAL_COMMUNITY)
Admission: EM | Admit: 2017-08-31 | Discharge: 2017-09-01 | Discharge status: Against Medical Advice | Payer: No Typology Code available for payment source

## 2017-08-31 NOTE — ED Notes (Signed)
Called for triage x4 with no answer

## 2017-09-05 ENCOUNTER — Encounter (HOSPITAL_COMMUNITY): Payer: Self-pay | Admitting: *Deleted

## 2017-09-05 DIAGNOSIS — Y92028 Other place in mobile home as the place of occurrence of the external cause: Secondary | ICD-10-CM | POA: Insufficient documentation

## 2017-09-05 DIAGNOSIS — Y999 Unspecified external cause status: Secondary | ICD-10-CM | POA: Insufficient documentation

## 2017-09-05 DIAGNOSIS — Y939 Activity, unspecified: Secondary | ICD-10-CM | POA: Insufficient documentation

## 2017-09-05 DIAGNOSIS — Z79899 Other long term (current) drug therapy: Secondary | ICD-10-CM | POA: Insufficient documentation

## 2017-09-05 DIAGNOSIS — S40011A Contusion of right shoulder, initial encounter: Secondary | ICD-10-CM | POA: Insufficient documentation

## 2017-09-05 DIAGNOSIS — W01198A Fall on same level from slipping, tripping and stumbling with subsequent striking against other object, initial encounter: Secondary | ICD-10-CM | POA: Insufficient documentation

## 2017-09-05 DIAGNOSIS — M25561 Pain in right knee: Secondary | ICD-10-CM | POA: Insufficient documentation

## 2017-09-05 DIAGNOSIS — F17210 Nicotine dependence, cigarettes, uncomplicated: Secondary | ICD-10-CM | POA: Insufficient documentation

## 2017-09-05 NOTE — ED Triage Notes (Signed)
Pt c/o right knee pain and right shoulder pain after a fall; pt states he has bruising to right shoulder; pt is also c/o rom issues with right ring finger; pt states he is unable to straighten it out completely

## 2017-09-06 ENCOUNTER — Emergency Department (HOSPITAL_COMMUNITY)
Admission: EM | Admit: 2017-09-06 | Discharge: 2017-09-06 | Discharge status: Against Medical Advice | Payer: Self-pay | Attending: Emergency Medicine | Admitting: Emergency Medicine

## 2017-09-06 ENCOUNTER — Emergency Department (HOSPITAL_COMMUNITY): Payer: Self-pay

## 2017-09-06 DIAGNOSIS — M25561 Pain in right knee: Secondary | ICD-10-CM

## 2017-09-06 DIAGNOSIS — S40011A Contusion of right shoulder, initial encounter: Secondary | ICD-10-CM

## 2017-09-06 MED ORDER — CYCLOBENZAPRINE HCL 10 MG PO TABS
5.0000 mg | ORAL_TABLET | Freq: Once | ORAL | Status: AC
  Administered 2017-09-06: 5 mg via ORAL
  Filled 2017-09-06: qty 1

## 2017-09-06 MED ORDER — IBUPROFEN 400 MG PO TABS
600.0000 mg | ORAL_TABLET | Freq: Once | ORAL | Status: AC
Start: 2017-09-06 — End: 2017-09-06
  Administered 2017-09-06: 600 mg via ORAL
  Filled 2017-09-06: qty 2

## 2017-09-06 NOTE — ED Provider Notes (Signed)
[facility] EMERGENCY DEPARTMENT Provider Note   CSN: [id number] Arrival date & time: 09/05/17  2256  Time seen 12:42 AM   History   Chief Complaint Chief Complaint  Patient presents with  . Knee Pain    HPI Marvin Schroeder is a 41 y.o. male.  HPI patient reports about 9 PM he was leaving his trailer to go pick up another ER patient to bring here to be seen and he slipped on the second out of 3 steps going off the porch.  He states his right shoulder hit the deck railing, and he felt a pop in his right knee.  He states when he straightens his right knee hurts in the popliteal area.  He did not hit his head or have loss of consciousness.  He does not mention any current problems with his finger to me that he did not triage.  He did tell me he had been seen on October 1 for the finger and states he still has some swelling but he moves it well.  PCP none  Past Medical History:  Diagnosis Date  . Back pain     There are no active problems to display for this patient.   Past Surgical History:  Procedure Laterality Date  . collarbone    . HAND SURGERY    . ORTHOPEDIC SURGERY         Home Medications    Prior to Admission medications   Medication Sig Start Date End Date Taking? Authorizing Provider  cyclobenzaprine (FLEXERIL) 10 MG tablet Take 1 tablet (10 mg total) by mouth 3 (three) times daily. 11/16/16   Ivery Quale, PA-C  dexamethasone (DECADRON) 4 MG tablet Take 1 tablet (4 mg total) by mouth 2 (two) times daily with a meal. 11/16/16   Ivery Quale, PA-C  diclofenac (VOLTAREN) 75 MG EC tablet Take 1 tablet (75 mg total) by mouth 2 (two) times daily. 11/16/16   Ivery Quale, PA-C  HYDROcodone-acetaminophen (NORCO/VICODIN) 5-325 MG tablet Take 1 tablet by mouth every 6 (six) hours as needed for moderate pain. 11/03/16   Lawyer, Cristal Deer, PA-C  ibuprofen (ADVIL,MOTRIN) 800 MG tablet Take 1 tablet (800 mg total) by mouth 3 (three) times daily. Patient not taking:  Reported on 11/04/2016 06/27/16   Eber Hong, MD  oxyCODONE-acetaminophen (PERCOCET/ROXICET) 5-325 MG tablet Take 1 tablet by mouth every 6 (six) hours as needed. 11/07/16   Bethann Berkshire, MD    Family History Family History  Problem Relation Age of Onset  . Hypertension Other   . Cancer Other     Social History Social History  Substance Use Topics  . Smoking status: Current Some Day Smoker    Packs/day: 0.50    Types: Cigarettes  . Smokeless tobacco: Never Used  . Alcohol use No  employed   Allergies   Amoxicillin; Bee venom; Penicillins; and Xylocaine dental [lidocaine-epinephrine]   Review of Systems Review of Systems  All other systems reviewed and are negative.    Physical Exam Updated Vital Signs BP 118/82 (BP Location: Right Arm)   Pulse 84   Temp 98.6 F (37 C) (Oral)   Ht 6' (1.829 m)   Wt 94.8 kg (209 lb)   SpO2 100%   BMI 28.35 kg/m   Vital signs normal    Physical Exam  Constitutional: He is oriented to person, place, and time. He appears well-developed and well-nourished.  HENT:  Head: Normocephalic and atraumatic.  Right Ear: External ear normal.  Left Ear: External  ear normal.  Nose: Nose normal.  Eyes: Conjunctivae and EOM are normal.  Neck: Normal range of motion.  Cardiovascular: Normal rate.   Pulmonary/Chest: Effort normal. No respiratory distress.  Musculoskeletal:  Patient has a superficial abrasion on his right upper arm without active bleeding.  He appears to have some diffuse darkening of his right shoulder that does not appear to be bruising.  It does not rub off.  He has pain with range of motion especially with abduction.  On exam of his right lower extremity there is no obvious to me.  There does not appear to be a joint effusion.  Although he states it hurts to straighten his knee he is sitting on the stretcher with the leg extended.  Neurological: He is alert and oriented to person, place, and time. No cranial nerve  deficit.  Psychiatric: His behavior is normal. Thought content normal. His mood appears anxious. His speech is rapid and/or pressured.  Nursing note and vitals reviewed.        ED Treatments / Results  Labs (all labs ordered are listed, but only abnormal results are displayed) Labs Reviewed - No data to display  EKG  EKG Interpretation None       Radiology Dg Shoulder Right  Result Date: 09/06/2017 CLINICAL DATA:  Right knee and right shoulder pain after fall. Right shoulder bruising. EXAM: RIGHT SHOULDER - 2+ VIEW COMPARISON:  08/16/2017 FINDINGS: Curvilinear lucency projecting over the right humeral neck and proximal diaphysis is believed to be due to fat planes between muscles of the proximal arm simulating a fracture. The patient is status post plate and screw fixation of the mid clavicle. No recurrent fractures noted. No shoulder dislocation is seen. AC joint is intact. Cortical clavicular ligament calcifications consistent with old remote trauma are noted. The adjacent included ribs and lung are nonacute. IMPRESSION: 1. No acute displaced appearing fracture or joint dislocations. 2. Curvilinear lucency involving the proximal humerus is believed to be due to overlapping soft tissue planes of the arm. If the patient has pain out of proportion to radiographic findings, additional imaging of the entire humerus is recommended for better assessment. Electronically Signed   By: Tollie Eth M.D.   On: 09/06/2017 01:55   Dg Knee Complete 4 Views Right  Result Date: 09/06/2017 CLINICAL DATA:  Right knee pain after fall. EXAM: RIGHT KNEE - COMPLETE 4+ VIEW COMPARISON:  None. FINDINGS: No evidence of acute fracture or joint dislocation. Trace joint effusion. No evidence of arthropathy or other focal bone abnormality. Soft tissues are unremarkable. IMPRESSION: No acute fracture nor malalignment of the right knee. Trace joint effusion. Electronically Signed   By: Tollie Eth M.D.   On:  09/06/2017 02:09    Procedures Procedures (including critical care time)  Medications Ordered in ED Medications  ibuprofen (ADVIL,MOTRIN) tablet 600 mg (600 mg Oral Given 09/06/17 0214)  cyclobenzaprine (FLEXERIL) tablet 5 mg (5 mg Oral Given 09/06/17 0215)     Initial Impression / Assessment and Plan / ED Course  I have reviewed the triage vital signs and the nursing notes.  Pertinent labs & imaging results that were available during my care of the patient were reviewed by me and considered in my medical decision making (see chart for details).     Patient was given ibuprofen and Flexeril for his discomfort.  He was given ice packs.  After reviewing his x-ray results a humerus x-ray was ordered.  Patient became angry, he signed out AMA.  He  said I should have ordered it from the beginning.  States he is going to go to [place].  Please note patient was seen October 1 with very similar history that he slipped and fell going down the steps and hurt his right hand.  Patient has had multiple visits in the past for chronic low back pain.   Final Clinical Impressions(s) / ED Diagnoses   Final diagnoses:  Contusion of right shoulder, initial encounter  Acute pain of right knee    Pt left AMA  Devoria AlbeIva Marrah Vanevery, MD, Concha PyoFACEP     Oswin Griffith, MD 09/06/17 Emeline [id number]

## 2017-09-06 NOTE — ED Notes (Signed)
Patient was informed he needed to have another x-ray of his arm, and after receiving his medications, he came out of room and states " I going to sign out AMA and go to [place], they should have done that scan to begin with", EDP Lynelle DoctorKnapp informed, patient signed AMA and ambulated out of ER.

## 2017-10-21 ENCOUNTER — Emergency Department (HOSPITAL_COMMUNITY): Payer: Self-pay

## 2017-10-21 ENCOUNTER — Emergency Department (HOSPITAL_COMMUNITY)
Admission: EM | Admit: 2017-10-21 | Discharge: 2017-10-22 | Disposition: A | Discharge status: Home / Self Care | Payer: Self-pay | Attending: Emergency Medicine | Admitting: Emergency Medicine

## 2017-10-21 ENCOUNTER — Encounter (HOSPITAL_COMMUNITY): Payer: Self-pay | Admitting: Emergency Medicine

## 2017-10-21 ENCOUNTER — Other Ambulatory Visit: Payer: Self-pay

## 2017-10-21 DIAGNOSIS — T07XXXA Unspecified multiple injuries, initial encounter: Secondary | ICD-10-CM

## 2017-10-21 DIAGNOSIS — S0232XA Fracture of orbital floor, left side, initial encounter for closed fracture: Secondary | ICD-10-CM | POA: Insufficient documentation

## 2017-10-21 DIAGNOSIS — Y939 Activity, unspecified: Secondary | ICD-10-CM | POA: Insufficient documentation

## 2017-10-21 DIAGNOSIS — Y929 Unspecified place or not applicable: Secondary | ICD-10-CM | POA: Insufficient documentation

## 2017-10-21 DIAGNOSIS — W19XXXA Unspecified fall, initial encounter: Secondary | ICD-10-CM

## 2017-10-21 DIAGNOSIS — S4991XA Unspecified injury of right shoulder and upper arm, initial encounter: Secondary | ICD-10-CM | POA: Insufficient documentation

## 2017-10-21 DIAGNOSIS — S3992XA Unspecified injury of lower back, initial encounter: Secondary | ICD-10-CM | POA: Insufficient documentation

## 2017-10-21 DIAGNOSIS — Z79899 Other long term (current) drug therapy: Secondary | ICD-10-CM | POA: Insufficient documentation

## 2017-10-21 DIAGNOSIS — Y999 Unspecified external cause status: Secondary | ICD-10-CM | POA: Insufficient documentation

## 2017-10-21 DIAGNOSIS — S2231XA Fracture of one rib, right side, initial encounter for closed fracture: Secondary | ICD-10-CM | POA: Insufficient documentation

## 2017-10-21 DIAGNOSIS — F17210 Nicotine dependence, cigarettes, uncomplicated: Secondary | ICD-10-CM | POA: Insufficient documentation

## 2017-10-21 DIAGNOSIS — W1789XA Other fall from one level to another, initial encounter: Secondary | ICD-10-CM | POA: Insufficient documentation

## 2017-10-21 DIAGNOSIS — Z880 Allergy status to penicillin: Secondary | ICD-10-CM | POA: Insufficient documentation

## 2017-10-21 MED ORDER — IOPAMIDOL (ISOVUE-300) INJECTION 61%
100.0000 mL | Freq: Once | INTRAVENOUS | Status: AC | PRN
  Administered 2017-10-21: 75 mL via INTRAVENOUS

## 2017-10-21 MED ORDER — HYDROMORPHONE HCL 1 MG/ML IJ SOLN
0.5000 mg | Freq: Once | INTRAMUSCULAR | Status: AC
  Administered 2017-10-21: 0.5 mg via INTRAVENOUS
  Filled 2017-10-21: qty 1

## 2017-10-21 MED ORDER — ONDANSETRON HCL 4 MG/2ML IJ SOLN
4.0000 mg | Freq: Once | INTRAMUSCULAR | Status: AC
  Administered 2017-10-21: 4 mg via INTRAVENOUS
  Filled 2017-10-21: qty 2
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
## 2017-10-21 NOTE — ED Notes (Signed)
Pt given ice pack

## 2017-10-21 NOTE — ED Notes (Signed)
Pt returning from X-ray at this time.
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
## 2017-10-21 NOTE — ED Notes (Signed)
Checked with pt for urine sample,pt can't go right now will try back in 30 minutes. 

## 2017-10-22 LAB — URINALYSIS, ROUTINE W REFLEX MICROSCOPIC
Bilirubin Urine: NEGATIVE
GLUCOSE, UA: NEGATIVE mg/dL
Hgb urine dipstick: NEGATIVE
Ketones, ur: NEGATIVE mg/dL
LEUKOCYTES UA: NEGATIVE
Nitrite: NEGATIVE
PROTEIN: NEGATIVE mg/dL
SPECIFIC GRAVITY, URINE: 1 — AB (ref 1.005–1.030)
pH: 6 (ref 5.0–8.0)

## 2017-10-22 MED ORDER — DIAZEPAM 5 MG PO TABS
5.0000 mg | ORAL_TABLET | Freq: Once | ORAL | Status: AC
  Administered 2017-10-22: 5 mg via ORAL
  Filled 2017-10-22: qty 1

## 2017-10-22 MED ORDER — HYDROCODONE-ACETAMINOPHEN 5-325 MG PO TABS
1.0000 | ORAL_TABLET | Freq: Once | ORAL | Status: AC
  Administered 2017-10-22: 1 via ORAL
  Filled 2017-10-22: qty 1

## 2017-10-22 MED ORDER — IBUPROFEN 600 MG PO TABS: 600.0000 mg | ORAL_TABLET | Freq: Four times a day (QID) | ORAL | 0 refills | Status: DC

## 2017-10-22 MED ORDER — CYCLOBENZAPRINE HCL 10 MG PO TABS: 10.0000 mg | ORAL_TABLET | Freq: Three times a day (TID) | ORAL | 0 refills | Status: DC

## 2017-10-22 NOTE — Discharge Instructions (Signed)
Your x-rays and CT scans are negative for acute fracture.  The CT scans of your back show some bulging disc in several areas.  Also some foraminal stenosis at the L5 area, but no acute findings.  The hardware of your right clavicle is intact.  The old rib fractures on the right appear to be healing nicely.  Please use Flexeril 3 times daily.  Please use ibuprofen with breakfast, lunch, dinner, and at bedtime.  You may use Tylenol in between the ibuprofen doses if needed.  Please see Dr. Romeo AppleHarrison for orthopedic evaluation if you continue to have pain of your back.

## 2017-10-22 NOTE — ED Notes (Signed)
Pt ambulatory to waiting room. Pt verbalized understanding of discharge instructions.   

## 2017-11-25 ENCOUNTER — Encounter (HOSPITAL_COMMUNITY): Payer: Self-pay | Admitting: Adult Health

## 2017-11-25 ENCOUNTER — Emergency Department (HOSPITAL_COMMUNITY)
Admission: EM | Admit: 2017-11-25 | Discharge: 2017-11-25 | Disposition: A | Discharge status: Home / Self Care | Payer: Self-pay | Attending: Emergency Medicine | Admitting: Emergency Medicine

## 2017-11-25 ENCOUNTER — Other Ambulatory Visit: Payer: Self-pay

## 2017-11-25 DIAGNOSIS — M5432 Sciatica, left side: Secondary | ICD-10-CM | POA: Insufficient documentation

## 2017-11-25 DIAGNOSIS — Z79899 Other long term (current) drug therapy: Secondary | ICD-10-CM | POA: Insufficient documentation

## 2017-11-25 DIAGNOSIS — F17210 Nicotine dependence, cigarettes, uncomplicated: Secondary | ICD-10-CM | POA: Insufficient documentation

## 2017-11-25 MED ORDER — PREDNISONE 10 MG PO TABS
60.0000 mg | ORAL_TABLET | Freq: Once | ORAL | Status: AC
  Administered 2017-11-25: 60 mg via ORAL
  Filled 2017-11-25: qty 1

## 2017-11-25 MED ORDER — HYDROCODONE-ACETAMINOPHEN 5-325 MG PO TABS: 1.0000 | ORAL_TABLET | ORAL | 0 refills | Status: DC | PRN

## 2017-11-25 MED ORDER — HYDROMORPHONE HCL 1 MG/ML IJ SOLN
1.0000 mg | Freq: Once | INTRAMUSCULAR | Status: AC
  Administered 2017-11-25: 1 mg via INTRAMUSCULAR
  Filled 2017-11-25: qty 1

## 2017-11-25 MED ORDER — PREDNISONE 10 MG PO TABS: ORAL_TABLET | ORAL | 0 refills | Status: DC

## 2017-11-25 NOTE — ED Triage Notes (Signed)
Presents with a slipped disk and is reportsing that over the last few days the pain is worse and he has increased pain when straining to have a BM. HE reports tingling and numbness in left upper leg up to tailbone but denies loss of bowel and vladder. Denies numbness in groin. HE also reports pain in the back and left leg as well.

## 2017-11-25 NOTE — Discharge Instructions (Signed)
Take your next dose of prednisone tomorrow morning.  Use the the other medicines as directed.  Do not drive within 4 hours of taking hydrocodone as this will make you drowsy.  Avoid lifting,  Bending,  Twisting or any other activity that worsens your pain over the next week.  Apply an  icepack alternating with heat to your lower back for 10-15 minutes every 3-4 hours for the next few days as discussed.  You should get rechecked if your symptoms are not better over the next 5 days,  Or you develop increased pain,  Weakness in your leg(s) or loss of bladder or bowel function - these are symptoms of a worsening condition.

## 2017-11-25 NOTE — ED Provider Notes (Signed)
[facility] EMERGENCY DEPARTMENT Provider Note   CSN: [id number] Arrival date & time: 11/25/17  1912     History   Chief Complaint Chief Complaint  Patient presents with  . Back Pain    HPI Marvin Schroeder is a 42 y.o. male presenting with acute on chronic low back pain which has been chronically sore, but severely worse this afternoon. He describes sitting down and swinging the left leg onto the furniture when he had a sudden severe pain in the left lower back with radiation of pain into the lower anteriolateral thigh which he describes as a "pulling and numb" sensation.   Patient denies any new injury specifically.  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 There has been no weakness  in the lower extremities and no urinary or bowel retention or incontinence.  Patient does not have a history of cancer or IVDU.  The patient has tried no medications prior to arrival here.  .  The history is provided by the patient.    Past Medical History:  Diagnosis Date  . Back pain     There are no active problems to display for this patient.   Past Surgical History:  Procedure Laterality Date  . collarbone    . HAND SURGERY    . ORTHOPEDIC SURGERY         Home Medications    Prior to Admission medications   Medication Sig Start Date End Date Taking? Authorizing Provider  cyclobenzaprine (FLEXERIL) 10 MG tablet Take 1 tablet (10 mg total) by mouth 3 (three) times daily. 10/22/17   Ivery Quale, PA-C  HYDROcodone-acetaminophen (NORCO/VICODIN) 5-325 MG tablet Take 1 tablet by mouth every 4 (four) hours as needed. 11/25/17   Burgess Amor, PA-C  ibuprofen (ADVIL,MOTRIN) 600 MG tablet Take 1 tablet (600 mg total) by mouth 4 (four) times daily. 10/22/17   Ivery Quale, PA-C  predniSONE (DELTASONE) 10 MG tablet Take 6 tablets day one, 5 tablets day two, 4 tablets day three, 3 tablets  day four, 2 tablets day five, then 1 tablet day six 11/25/17   Burgess Amor, PA-C    Family History Family History  Problem Relation Age of Onset  . Hypertension Other   . Cancer Other     Social History Social History   Tobacco Use  . Smoking status: Current Some Day Smoker    Packs/day: 0.50    Types: Cigarettes  . Smokeless tobacco: Never Used  Substance Use Topics  . Alcohol use: No  . Drug use: No     Allergies   Amoxicillin; Bee venom; Penicillins; and Xylocaine dental [lidocaine-epinephrine]   Review of Systems Review of Systems  Constitutional: Negative for fever.  Respiratory: Negative for shortness of breath.   Cardiovascular: Negative for chest pain and leg swelling.  Gastrointestinal: Negative for abdominal distention, abdominal pain and constipation.  Genitourinary: Negative for difficulty urinating, dysuria, flank pain, frequency and urgency.  Musculoskeletal: Positive for back pain. Negative for gait problem and joint swelling.  Skin: Negative for rash.  Neurological: Positive for numbness. Negative for weakness.     Physical Exam Updated Vital Signs BP 129/74 (BP Location: Right Arm)   Pulse 78   Temp 97.9 F (36.6 C) (Oral)   Resp 18   Ht 6' (1.829 m)   Wt 101.2 kg (223 lb)   SpO2 100%   BMI 30.24 kg/m   Physical Exam  Constitutional: He appears well-developed and well-nourished.  HENT:  Head: Normocephalic.  Eyes: Conjunctivae are normal.  Neck: Normal range of motion. Neck supple.  Cardiovascular: Normal rate and intact distal pulses.  Pedal pulses normal.  Pulmonary/Chest: Effort normal.  Abdominal: Soft. Bowel sounds are normal. He exhibits no distension and no mass.  Musculoskeletal: Normal range of motion. He exhibits no edema.       Lumbar back: He exhibits tenderness. He exhibits no bony tenderness, no swelling, no edema and no spasm.       Back:  ttp left paralumbar soft tissue. No edema, no deformity.  Neurological: He is  alert. He has normal strength. He displays no atrophy and no tremor. No sensory deficit. Gait normal.  Reflex Scores:      Patellar reflexes are 2+ on the right side and 2+ on the left side. No strength deficit noted in hip and knee flexor and extensor muscle groups.  Ankle flexion and extension intact.  Skin: Skin is warm and dry.  Psychiatric: He has a normal mood and affect.  Nursing note and vitals reviewed.    ED Treatments / Results  Labs (all labs ordered are listed, but only abnormal results are displayed) Labs Reviewed - No data to display  EKG  EKG Interpretation None       Radiology No results found.  Procedures Procedures (including critical care time)  Medications Ordered in ED Medications  HYDROmorphone (DILAUDID) injection 1 mg (1 mg Intramuscular Given 11/25/17 2002)  predniSONE (DELTASONE) tablet 60 mg (60 mg Oral Given 11/25/17 2002)     Initial Impression / Assessment and Plan / ED Course  I have reviewed the triage vital signs and the nursing notes.  Pertinent labs & imaging results that were available during my care of the patient were reviewed by me and considered in my medical decision making (see chart for details).     [place] controlled substance database reviewed with no pattern suggesting abuse.  No neuro deficit on exam or by history to suggest emergent or surgical presentation. No sx suggesting cauda equina.  Discussed worsened sx that should prompt immediate re-evaluation including distal weakness, bowel/bladder retention/incontinence.         Final Clinical Impressions(s) / ED Diagnoses   Final diagnoses:  Sciatica of left side    ED Discharge Orders        Ordered    predniSONE (DELTASONE) 10 MG tablet     11/25/17 2100    HYDROcodone-acetaminophen (NORCO/VICODIN) 5-325 MG tablet  Every 4 hours PRN     11/25/17 2100       Burgess Amordol, Hadar Elgersma, PA-C 11/25/17 2103    Bethann BerkshireZammit, Joseph, MD 11/25/17 2115
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
## 2018-08-22 ENCOUNTER — Emergency Department (HOSPITAL_COMMUNITY)
Admission: EM | Admit: 2018-08-22 | Discharge: 2018-08-22 | Disposition: A | Discharge status: Home / Self Care | Payer: Self-pay | Attending: Emergency Medicine | Admitting: Emergency Medicine

## 2018-08-22 ENCOUNTER — Encounter (HOSPITAL_COMMUNITY): Payer: Self-pay | Admitting: Emergency Medicine

## 2018-08-22 DIAGNOSIS — Z5321 Procedure and treatment not carried out due to patient leaving prior to being seen by health care provider: Secondary | ICD-10-CM | POA: Insufficient documentation

## 2018-08-22 DIAGNOSIS — T63441A Toxic effect of venom of bees, accidental (unintentional), initial encounter: Secondary | ICD-10-CM | POA: Insufficient documentation

## 2018-08-22 NOTE — ED Triage Notes (Signed)
Pt reports being stung by a few bees yesterday and his voice has been raspy today.  Denies trouble breathing or swallowing. NO meds taken today.

## 2018-08-23 ENCOUNTER — Emergency Department (HOSPITAL_COMMUNITY)
Admission: EM | Admit: 2018-08-23 | Discharge: 2018-08-23 | Disposition: A | Discharge status: Home / Self Care | Payer: Self-pay | Attending: Emergency Medicine | Admitting: Emergency Medicine

## 2018-08-23 ENCOUNTER — Encounter (HOSPITAL_COMMUNITY): Payer: Self-pay | Admitting: Emergency Medicine

## 2018-08-23 ENCOUNTER — Other Ambulatory Visit: Payer: Self-pay

## 2018-08-23 DIAGNOSIS — R21 Rash and other nonspecific skin eruption: Secondary | ICD-10-CM | POA: Insufficient documentation

## 2018-08-23 DIAGNOSIS — Z5321 Procedure and treatment not carried out due to patient leaving prior to being seen by health care provider: Secondary | ICD-10-CM | POA: Insufficient documentation
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
## 2019-10-21 ENCOUNTER — Encounter (HOSPITAL_COMMUNITY): Payer: Self-pay

## 2019-10-21 ENCOUNTER — Emergency Department (HOSPITAL_COMMUNITY): Payer: Self-pay

## 2019-10-21 ENCOUNTER — Emergency Department (HOSPITAL_COMMUNITY)
Admission: EM | Admit: 2019-10-21 | Discharge: 2019-10-21 | Disposition: A | Discharge status: Home / Self Care | Payer: Self-pay | Attending: Emergency Medicine | Admitting: Emergency Medicine

## 2019-10-21 ENCOUNTER — Other Ambulatory Visit: Payer: Self-pay

## 2019-10-21 DIAGNOSIS — M79674 Pain in right toe(s): Secondary | ICD-10-CM | POA: Insufficient documentation

## 2019-10-21 DIAGNOSIS — Y939 Activity, unspecified: Secondary | ICD-10-CM | POA: Insufficient documentation

## 2019-10-21 DIAGNOSIS — G8929 Other chronic pain: Secondary | ICD-10-CM

## 2019-10-21 DIAGNOSIS — Z765 Malingerer [conscious simulation]: Secondary | ICD-10-CM

## 2019-10-21 DIAGNOSIS — Y929 Unspecified place or not applicable: Secondary | ICD-10-CM | POA: Insufficient documentation

## 2019-10-21 DIAGNOSIS — F17210 Nicotine dependence, cigarettes, uncomplicated: Secondary | ICD-10-CM | POA: Insufficient documentation

## 2019-10-21 DIAGNOSIS — Z791 Long term (current) use of non-steroidal anti-inflammatories (NSAID): Secondary | ICD-10-CM | POA: Insufficient documentation

## 2019-10-21 DIAGNOSIS — Y999 Unspecified external cause status: Secondary | ICD-10-CM | POA: Insufficient documentation

## 2019-10-21 MED ORDER — CYCLOBENZAPRINE HCL 10 MG PO TABS: 10.0000 mg | ORAL_TABLET | Freq: Three times a day (TID) | ORAL | 0 refills | Status: DC | PRN

## 2019-10-21 MED ORDER — GABAPENTIN 400 MG PO CAPS
1200.0000 mg | ORAL_CAPSULE | Freq: Three times a day (TID) | ORAL | 0 refills | Status: DC
Start: 2019-10-21 — End: 2020-03-31

## 2019-10-21 NOTE — ED Notes (Signed)
[employer] at bedside speaking with Pt.
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
## 2019-10-21 NOTE — ED Notes (Signed)
Roxanne Mins MD at bedside speaking with pt.
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
## 2019-10-21 NOTE — Discharge Instructions (Signed)
Please follow up with your orthopedic surgeon.  

## 2019-12-02 ENCOUNTER — Emergency Department (HOSPITAL_COMMUNITY)
Admission: EM | Admit: 2019-12-02 | Discharge: 2019-12-02 | Disposition: A | Discharge status: Home / Self Care | Payer: Self-pay | Attending: Emergency Medicine | Admitting: Emergency Medicine

## 2019-12-02 ENCOUNTER — Encounter (HOSPITAL_COMMUNITY): Payer: Self-pay | Admitting: Emergency Medicine

## 2019-12-02 ENCOUNTER — Other Ambulatory Visit: Payer: Self-pay

## 2019-12-02 DIAGNOSIS — Z5321 Procedure and treatment not carried out due to patient leaving prior to being seen by health care provider: Secondary | ICD-10-CM | POA: Insufficient documentation

## 2019-12-02 NOTE — ED Provider Notes (Signed)
I did not evaluate this patient.  He left the ed prior to being roomed.    Burgess Amor, PA-C 12/02/19 1917    Donnetta Hutching, MD 12/03/19 [phone]
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
## 2019-12-04 ENCOUNTER — Emergency Department (HOSPITAL_COMMUNITY)
Admission: EM | Admit: 2019-12-04 | Discharge: 2019-12-04 | Disposition: A | Discharge status: Home / Self Care | Payer: Self-pay | Attending: Emergency Medicine | Admitting: Emergency Medicine

## 2019-12-04 ENCOUNTER — Other Ambulatory Visit: Payer: Self-pay

## 2019-12-04 ENCOUNTER — Encounter (HOSPITAL_COMMUNITY): Payer: Self-pay

## 2019-12-04 ENCOUNTER — Emergency Department (HOSPITAL_COMMUNITY): Payer: Self-pay

## 2019-12-04 DIAGNOSIS — Z79899 Other long term (current) drug therapy: Secondary | ICD-10-CM | POA: Insufficient documentation

## 2019-12-04 DIAGNOSIS — Y929 Unspecified place or not applicable: Secondary | ICD-10-CM | POA: Insufficient documentation

## 2019-12-04 DIAGNOSIS — X500XXA Overexertion from strenuous movement or load, initial encounter: Secondary | ICD-10-CM | POA: Insufficient documentation

## 2019-12-04 DIAGNOSIS — F17210 Nicotine dependence, cigarettes, uncomplicated: Secondary | ICD-10-CM | POA: Insufficient documentation

## 2019-12-04 DIAGNOSIS — Y9383 Activity, rough housing and horseplay: Secondary | ICD-10-CM | POA: Insufficient documentation

## 2019-12-04 DIAGNOSIS — M25552 Pain in left hip: Secondary | ICD-10-CM | POA: Insufficient documentation

## 2019-12-04 DIAGNOSIS — M25532 Pain in left wrist: Secondary | ICD-10-CM | POA: Insufficient documentation

## 2019-12-04 DIAGNOSIS — Y999 Unspecified external cause status: Secondary | ICD-10-CM | POA: Insufficient documentation

## 2019-12-04 MED ORDER — IBUPROFEN 400 MG PO TABS
600.0000 mg | ORAL_TABLET | Freq: Once | ORAL | Status: AC
  Administered 2019-12-04: 600 mg via ORAL
  Filled 2019-12-04: qty 2

## 2019-12-04 NOTE — Discharge Instructions (Signed)
Your xrays did not show any acute changes today. Please follow up with your orthopedic surgeon regarding continued pain.   You can take Ibuprofen and Tylenol as needed.
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
## 2020-03-31 ENCOUNTER — Encounter (HOSPITAL_COMMUNITY): Payer: Self-pay | Admitting: Emergency Medicine

## 2020-03-31 ENCOUNTER — Emergency Department (HOSPITAL_COMMUNITY): Payer: Self-pay

## 2020-03-31 ENCOUNTER — Emergency Department (HOSPITAL_COMMUNITY)
Admission: EM | Admit: 2020-03-31 | Discharge: 2020-03-31 | Disposition: A | Discharge status: Home / Self Care | Payer: Self-pay | Attending: Emergency Medicine | Admitting: Emergency Medicine

## 2020-03-31 ENCOUNTER — Other Ambulatory Visit: Payer: Self-pay

## 2020-03-31 DIAGNOSIS — G894 Chronic pain syndrome: Secondary | ICD-10-CM | POA: Insufficient documentation

## 2020-03-31 DIAGNOSIS — Z993 Dependence on wheelchair: Secondary | ICD-10-CM | POA: Insufficient documentation

## 2020-03-31 DIAGNOSIS — F1110 Opioid abuse, uncomplicated: Secondary | ICD-10-CM | POA: Insufficient documentation

## 2020-03-31 DIAGNOSIS — Y929 Unspecified place or not applicable: Secondary | ICD-10-CM | POA: Insufficient documentation

## 2020-03-31 DIAGNOSIS — F419 Anxiety disorder, unspecified: Secondary | ICD-10-CM | POA: Insufficient documentation

## 2020-03-31 DIAGNOSIS — Y999 Unspecified external cause status: Secondary | ICD-10-CM | POA: Insufficient documentation

## 2020-03-31 DIAGNOSIS — F1190 Opioid use, unspecified, uncomplicated: Secondary | ICD-10-CM

## 2020-03-31 DIAGNOSIS — F17210 Nicotine dependence, cigarettes, uncomplicated: Secondary | ICD-10-CM | POA: Insufficient documentation

## 2020-03-31 DIAGNOSIS — Y9389 Activity, other specified: Secondary | ICD-10-CM | POA: Insufficient documentation

## 2020-03-31 LAB — URINALYSIS, ROUTINE W REFLEX MICROSCOPIC
Bacteria, UA: NONE SEEN
Bilirubin Urine: NEGATIVE
Glucose, UA: NEGATIVE mg/dL
Hgb urine dipstick: NEGATIVE
Ketones, ur: NEGATIVE mg/dL
Nitrite: NEGATIVE
Protein, ur: NEGATIVE mg/dL
Specific Gravity, Urine: 1.014 (ref 1.005–1.030)
pH: 7 (ref 5.0–8.0)

## 2020-03-31 LAB — COMPREHENSIVE METABOLIC PANEL
ALT: 27 U/L (ref 0–44)
AST: 24 U/L (ref 15–41)
Albumin: 4 g/dL (ref 3.5–5.0)
Alkaline Phosphatase: 124 U/L (ref 38–126)
Anion gap: 9 (ref 5–15)
BUN: 9 mg/dL (ref 6–20)
CO2: 27 mmol/L (ref 22–32)
Calcium: 9 mg/dL (ref 8.9–10.3)
Chloride: 99 mmol/L (ref 98–111)
Creatinine, Ser: 0.75 mg/dL (ref 0.61–1.24)
GFR calc Af Amer: >60 mL/min (ref >60)
GFR calc non Af Amer: >60 mL/min (ref >60)
Glucose, Bld: 123 mg/dL — ABNORMAL HIGH (ref 70–99)
Potassium: 3.5 mmol/L (ref 3.5–5.1)
Sodium: 135 mmol/L (ref 135–145)
Total Bilirubin: 0.6 mg/dL (ref 0.3–1.2)
Total Protein: 7.5 g/dL (ref 6.5–8.1)

## 2020-03-31 LAB — CBC
HCT: 42.8 % (ref 39.0–52.0)
Hemoglobin: 13.8 g/dL (ref 13.0–17.0)
MCH: 29.3 pg (ref 26.0–34.0)
MCHC: 32.2 g/dL (ref 30.0–36.0)
MCV: 90.9 fL (ref 80.0–100.0)
Platelets: 338 10*3/uL (ref 150–400)
RBC: 4.71 MIL/uL (ref 4.22–5.81)
RDW: 12.9 % (ref 11.5–15.5)
WBC: 6.5 10*3/uL (ref 4.0–10.5)
nRBC: 0 % (ref 0.0–0.2)

## 2020-03-31 LAB — RAPID URINE DRUG SCREEN, HOSP PERFORMED
Amphetamines: POSITIVE — AB
Barbiturates: NOT DETECTED
Benzodiazepines: NOT DETECTED
Cocaine: POSITIVE — AB
Opiates: POSITIVE — AB
Tetrahydrocannabinol: NOT DETECTED

## 2020-03-31 LAB — ACETAMINOPHEN LEVEL: Acetaminophen (Tylenol), Serum: <10 ug/mL — ABNORMAL LOW (ref 10–30)

## 2020-03-31 LAB — ETHANOL: Alcohol, Ethyl (B): <10 mg/dL (ref <10)

## 2020-03-31 MED ORDER — ONDANSETRON 4 MG PO TBDP: 4.0000 mg | ORAL_TABLET | Freq: Four times a day (QID) | ORAL | Status: DC | PRN

## 2020-03-31 MED ORDER — LOPERAMIDE HCL 2 MG PO CAPS: 2.0000 mg | ORAL_CAPSULE | ORAL | Status: DC | PRN

## 2020-03-31 MED ORDER — CLONIDINE HCL 0.1 MG PO TABS: 0.1000 mg | ORAL_TABLET | Freq: Every day | ORAL | Status: DC

## 2020-03-31 MED ORDER — HYDROXYZINE HCL 25 MG PO TABS
25.0000 mg | ORAL_TABLET | Freq: Four times a day (QID) | ORAL | Status: DC | PRN
  Administered 2020-03-31: 25 mg via ORAL
  Filled 2020-03-31: qty 1

## 2020-03-31 MED ORDER — CLONIDINE HCL 0.1 MG PO TABS
ORAL_TABLET | ORAL | 0 refills | Status: AC
Start: 2020-03-31 — End: 2020-04-03

## 2020-03-31 MED ORDER — CLONIDINE HCL 0.1 MG PO TABS: 0.1000 mg | ORAL_TABLET | Freq: Two times a day (BID) | ORAL | Status: DC

## 2020-03-31 MED ORDER — LORAZEPAM 1 MG PO TABS
1.0000 mg | ORAL_TABLET | Freq: Once | ORAL | Status: AC
  Administered 2020-03-31: 1 mg via ORAL
  Filled 2020-03-31: qty 1

## 2020-03-31 MED ORDER — DICYCLOMINE HCL 20 MG PO TABS
20.0000 mg | ORAL_TABLET | Freq: Four times a day (QID) | ORAL | Status: DC | PRN
  Filled 2020-03-31: qty 1

## 2020-03-31 MED ORDER — CLONIDINE HCL 0.1 MG PO TABS
0.1000 mg | ORAL_TABLET | Freq: Four times a day (QID) | ORAL | Status: DC
  Administered 2020-03-31: 0.1 mg via ORAL
  Filled 2020-03-31: qty 1

## 2020-03-31 MED ORDER — METHOCARBAMOL 500 MG PO TABS
500.0000 mg | ORAL_TABLET | Freq: Three times a day (TID) | ORAL | Status: DC | PRN
  Administered 2020-03-31: 500 mg via ORAL
  Filled 2020-03-31: qty 1

## 2020-03-31 MED ORDER — NAPROXEN 250 MG PO TABS
500.0000 mg | ORAL_TABLET | Freq: Two times a day (BID) | ORAL | Status: DC | PRN
  Administered 2020-03-31: 500 mg via ORAL
  Filled 2020-03-31: qty 2

## 2020-03-31 MED ORDER — ONDANSETRON 4 MG PO TBDP
4.0000 mg | ORAL_TABLET | Freq: Three times a day (TID) | ORAL | 0 refills | Status: AC | PRN
Start: 2020-03-31 — End: ?

## 2020-03-31 MED ORDER — METHOCARBAMOL 500 MG PO TABS
500.0000 mg | ORAL_TABLET | Freq: Three times a day (TID) | ORAL | 0 refills | Status: AC | PRN
Start: 2020-03-31 — End: ?

## 2020-03-31 MED ORDER — HYDROXYZINE HCL 25 MG PO TABS: 25.0000 mg | ORAL_TABLET | Freq: Three times a day (TID) | ORAL | 0 refills | Status: AC | PRN

## 2020-03-31 NOTE — BH Assessment (Signed)
Clinician faxed otpt referral information to [facility] for pt; confirmed the fax went through at [phone].

## 2020-03-31 NOTE — ED Notes (Signed)
Pt in bed, pt denies si or hi at this time, states that it is just his anxiety.  Pt states that two weeks ago he started using heroin for his pain control.  Pt also c/o muscle spasm/pain to his neck, med given as ordered.

## 2020-03-31 NOTE — Discharge Instructions (Addendum)
Follow-up with the resources you have been given.  Take the medicines to help with withdrawal of the heroin.
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
## 2020-03-31 NOTE — ED Triage Notes (Signed)
Pt presents with complaint of "hip pain"     also complains of a "Panic attack"  Pt in parking lot with his mother when triage call
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
## 2020-03-31 NOTE — BH Assessment (Addendum)
Clinician contacted pt's RN, Feliz Beam, and provider, Dr. Rubin Payor, regarding the [facility] Assessment ordered for pt. Clinician inquired as to whether additional information had been provided by pt re: SI, HI, or AVH, as if he hadn't, a Peer Support referral and otpt referral information would better meet pt's needs. Dr. Rubin Payor confirmed that pt had only expressed anxiety and that he had not expressed SI or HI. Clinician shared she would fax information for pt to seek otpt resources.

## 2020-04-21 ENCOUNTER — Encounter (HOSPITAL_COMMUNITY): Payer: Self-pay | Admitting: Emergency Medicine

## 2020-04-21 ENCOUNTER — Emergency Department (HOSPITAL_COMMUNITY)
Admission: EM | Admit: 2020-04-21 | Discharge: 2020-04-22 | Disposition: A | Discharge status: Home / Self Care | Payer: Self-pay | Attending: Emergency Medicine | Admitting: Emergency Medicine

## 2020-04-21 ENCOUNTER — Other Ambulatory Visit: Payer: Self-pay

## 2020-04-21 DIAGNOSIS — Z5321 Procedure and treatment not carried out due to patient leaving prior to being seen by health care provider: Secondary | ICD-10-CM | POA: Insufficient documentation

## 2020-04-21 DIAGNOSIS — M25552 Pain in left hip: Secondary | ICD-10-CM | POA: Insufficient documentation
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
## 2020-04-21 NOTE — ED Notes (Signed)
No answer for room x 1 

## 2020-04-21 NOTE — ED Notes (Signed)
No answer x3 and not visible in lobby 

## 2020-04-22 ENCOUNTER — Emergency Department (HOSPITAL_COMMUNITY): Admission: EM | Admit: 2020-04-22 | Discharge: 2020-04-22 | Discharge status: Against Medical Advice | Payer: Self-pay

## 2020-05-04 ENCOUNTER — Emergency Department (HOSPITAL_COMMUNITY)
Admission: EM | Admit: 2020-05-04 | Discharge: 2020-05-04 | Discharge status: Against Medical Advice | Payer: Self-pay | Attending: Emergency Medicine | Admitting: Emergency Medicine

## 2020-05-04 ENCOUNTER — Other Ambulatory Visit: Payer: Self-pay

## 2020-05-04 ENCOUNTER — Encounter (HOSPITAL_COMMUNITY): Payer: Self-pay | Admitting: Emergency Medicine

## 2020-05-04 DIAGNOSIS — R2242 Localized swelling, mass and lump, left lower limb: Secondary | ICD-10-CM | POA: Insufficient documentation

## 2020-05-04 DIAGNOSIS — Z5321 Procedure and treatment not carried out due to patient leaving prior to being seen by health care provider: Secondary | ICD-10-CM | POA: Insufficient documentation
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
## 2020-08-23 ENCOUNTER — Emergency Department (HOSPITAL_COMMUNITY): Payer: Self-pay

## 2020-08-23 ENCOUNTER — Other Ambulatory Visit: Payer: Self-pay

## 2020-08-23 ENCOUNTER — Emergency Department (HOSPITAL_COMMUNITY)
Admission: EM | Admit: 2020-08-23 | Discharge: 2020-08-23 | Disposition: A | Discharge status: Home / Self Care | Payer: Self-pay | Attending: Emergency Medicine | Admitting: Emergency Medicine

## 2020-08-23 ENCOUNTER — Encounter (HOSPITAL_COMMUNITY): Payer: Self-pay | Admitting: *Deleted

## 2020-08-23 DIAGNOSIS — M79605 Pain in left leg: Secondary | ICD-10-CM | POA: Insufficient documentation

## 2020-08-23 DIAGNOSIS — G8220 Paraplegia, unspecified: Secondary | ICD-10-CM | POA: Insufficient documentation

## 2020-08-23 DIAGNOSIS — F17210 Nicotine dependence, cigarettes, uncomplicated: Secondary | ICD-10-CM | POA: Insufficient documentation

## 2020-08-23 LAB — CBC WITH DIFFERENTIAL/PLATELET
Abs Immature Granulocytes: 0.02 10*3/uL (ref 0.00–0.07)
Basophils Absolute: 0.1 10*3/uL (ref 0.0–0.1)
Basophils Relative: 1 %
Eosinophils Absolute: 0.2 10*3/uL (ref 0.0–0.5)
Eosinophils Relative: 4 %
HCT: 45.3 % (ref 39.0–52.0)
Hemoglobin: 14.8 g/dL (ref 13.0–17.0)
Immature Granulocytes: 0 %
Lymphocytes Relative: 33 %
Lymphs Abs: 2.1 10*3/uL (ref 0.7–4.0)
MCH: 29.6 pg (ref 26.0–34.0)
MCHC: 32.7 g/dL (ref 30.0–36.0)
MCV: 90.6 fL (ref 80.0–100.0)
Monocytes Absolute: 0.6 10*3/uL (ref 0.1–1.0)
Monocytes Relative: 9 %
Neutro Abs: 3.4 10*3/uL (ref 1.7–7.7)
Neutrophils Relative %: 53 %
Platelets: 377 10*3/uL (ref 150–400)
RBC: 5 MIL/uL (ref 4.22–5.81)
RDW: 13.2 % (ref 11.5–15.5)
WBC: 6.5 10*3/uL (ref 4.0–10.5)
nRBC: 0 % (ref 0.0–0.2)

## 2020-08-23 LAB — URINALYSIS, ROUTINE W REFLEX MICROSCOPIC
Glucose, UA: NEGATIVE mg/dL
Hgb urine dipstick: NEGATIVE
Ketones, ur: NEGATIVE mg/dL
Leukocytes,Ua: NEGATIVE
Nitrite: NEGATIVE
Protein, ur: NEGATIVE mg/dL
Specific Gravity, Urine: >1.03 — ABNORMAL HIGH (ref 1.005–1.030)
pH: 6 (ref 5.0–8.0)

## 2020-08-23 LAB — COMPREHENSIVE METABOLIC PANEL
ALT: 24 U/L (ref 0–44)
AST: 21 U/L (ref 15–41)
Albumin: 4.5 g/dL (ref 3.5–5.0)
Alkaline Phosphatase: 104 U/L (ref 38–126)
Anion gap: 9 (ref 5–15)
BUN: 13 mg/dL (ref 6–20)
CO2: 27 mmol/L (ref 22–32)
Calcium: 9.5 mg/dL (ref 8.9–10.3)
Chloride: 102 mmol/L (ref 98–111)
Creatinine, Ser: 0.77 mg/dL (ref 0.61–1.24)
GFR, Estimated: >60 mL/min (ref >60)
Glucose, Bld: 102 mg/dL — ABNORMAL HIGH (ref 70–99)
Potassium: 3.5 mmol/L (ref 3.5–5.1)
Sodium: 138 mmol/L (ref 135–145)
Total Bilirubin: 0.5 mg/dL (ref 0.3–1.2)
Total Protein: 8.4 g/dL — ABNORMAL HIGH (ref 6.5–8.1)

## 2020-08-23 NOTE — Discharge Instructions (Addendum)
We have contacted our care manager to help arrange for some home health services to improve your situation.  They will contact you to make these arrangements.  I have given you the name of a physical medicine doctor, in [place] who works in a practice that might be able to help you as well.  It may also be helpful to find a local primary care doctor, and we are including information for that on the resource guide.  Use Tylenol, or Motrin for your pain.
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
## 2020-08-23 NOTE — ED Notes (Signed)
IV d/c'd to left AC, catheter intact and site WNL, dsg applied to site  
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
## 2021-05-30 ENCOUNTER — Encounter (HOSPITAL_COMMUNITY): Payer: Self-pay | Admitting: *Deleted

## 2021-05-30 ENCOUNTER — Emergency Department (HOSPITAL_COMMUNITY)
Admission: EM | Admit: 2021-05-30 | Discharge: 2021-05-30 | Disposition: A | Discharge status: Home / Self Care | Payer: Medicaid Other | Attending: Emergency Medicine | Admitting: Emergency Medicine

## 2021-05-30 DIAGNOSIS — W57XXXA Bitten or stung by nonvenomous insect and other nonvenomous arthropods, initial encounter: Secondary | ICD-10-CM | POA: Diagnosis not present

## 2021-05-30 DIAGNOSIS — M7989 Other specified soft tissue disorders: Secondary | ICD-10-CM | POA: Diagnosis present

## 2021-05-30 DIAGNOSIS — L03113 Cellulitis of right upper limb: Secondary | ICD-10-CM | POA: Diagnosis not present

## 2021-05-30 DIAGNOSIS — F17210 Nicotine dependence, cigarettes, uncomplicated: Secondary | ICD-10-CM | POA: Diagnosis not present

## 2021-05-30 DIAGNOSIS — L0390 Cellulitis, unspecified: Secondary | ICD-10-CM

## 2021-05-30 MED ORDER — DOXYCYCLINE HYCLATE 100 MG PO TABS
100.0000 mg | ORAL_TABLET | Freq: Once | ORAL | Status: AC
  Administered 2021-05-30: 100 mg via ORAL
  Filled 2021-05-30: qty 1

## 2021-05-30 MED ORDER — DOXYCYCLINE HYCLATE 100 MG PO CAPS: 100.0000 mg | ORAL_CAPSULE | Freq: Two times a day (BID) | ORAL | 0 refills | Status: AC

## 2021-05-30 MED ORDER — ACETAMINOPHEN 325 MG PO TABS
650.0000 mg | ORAL_TABLET | Freq: Once | ORAL | Status: AC
  Administered 2021-05-30: 650 mg via ORAL
  Filled 2021-05-30: qty 2
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
## 2021-05-30 NOTE — ED Triage Notes (Signed)
Red inflamed area right wrist x 2days

## 2021-05-30 NOTE — Discharge Instructions (Addendum)
You were given a prescription for antibiotics. Please take the antibiotic prescription fully.   Use warm compresses at home  Follow up in 48 hours for reassessment   Please return to the emergency department for any new or worsening symptoms.

## 2021-06-28 ENCOUNTER — Emergency Department (HOSPITAL_COMMUNITY)
Admission: EM | Admit: 2021-06-28 | Discharge: 2021-06-28 | Disposition: A | Discharge status: Home / Self Care | Payer: Medicaid Other | Attending: Emergency Medicine | Admitting: Emergency Medicine

## 2021-06-28 ENCOUNTER — Encounter (HOSPITAL_COMMUNITY): Payer: Self-pay | Admitting: Emergency Medicine

## 2021-06-28 ENCOUNTER — Other Ambulatory Visit: Payer: Self-pay

## 2021-06-28 ENCOUNTER — Emergency Department (HOSPITAL_COMMUNITY): Payer: Medicaid Other

## 2021-06-28 DIAGNOSIS — S0083XA Contusion of other part of head, initial encounter: Secondary | ICD-10-CM

## 2021-06-28 DIAGNOSIS — S0990XA Unspecified injury of head, initial encounter: Secondary | ICD-10-CM | POA: Diagnosis not present

## 2021-06-28 DIAGNOSIS — Y92524 Gas station as the place of occurrence of the external cause: Secondary | ICD-10-CM | POA: Insufficient documentation

## 2021-06-28 DIAGNOSIS — S0591XA Unspecified injury of right eye and orbit, initial encounter: Secondary | ICD-10-CM | POA: Diagnosis present

## 2021-06-28 DIAGNOSIS — S0511XA Contusion of eyeball and orbital tissues, right eye, initial encounter: Secondary | ICD-10-CM | POA: Diagnosis not present

## 2021-06-28 DIAGNOSIS — M25552 Pain in left hip: Secondary | ICD-10-CM | POA: Diagnosis not present

## 2021-06-28 DIAGNOSIS — F17210 Nicotine dependence, cigarettes, uncomplicated: Secondary | ICD-10-CM | POA: Insufficient documentation

## 2021-06-28 DIAGNOSIS — S7002XA Contusion of left hip, initial encounter: Secondary | ICD-10-CM

## 2021-06-28 DIAGNOSIS — S161XXA Strain of muscle, fascia and tendon at neck level, initial encounter: Secondary | ICD-10-CM

## 2021-06-28 NOTE — Discharge Instructions (Addendum)
Local wound care with bacitracin twice daily.  Take ibuprofen 600 mg every 6 hours as needed for pain.  Follow-up with your primary doctor if symptoms are not improving in the next week.
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
## 2021-09-11 ENCOUNTER — Encounter (HOSPITAL_COMMUNITY): Payer: Self-pay | Admitting: Emergency Medicine

## 2021-09-11 ENCOUNTER — Other Ambulatory Visit: Payer: Self-pay

## 2021-09-11 ENCOUNTER — Emergency Department (HOSPITAL_COMMUNITY): Payer: Medicaid Other

## 2021-09-11 ENCOUNTER — Emergency Department (HOSPITAL_COMMUNITY)
Admission: EM | Admit: 2021-09-11 | Discharge: 2021-09-11 | Payer: Medicaid Other | Attending: Emergency Medicine | Admitting: Emergency Medicine

## 2021-09-11 DIAGNOSIS — M25552 Pain in left hip: Secondary | ICD-10-CM

## 2021-09-11 DIAGNOSIS — W1839XA Other fall on same level, initial encounter: Secondary | ICD-10-CM | POA: Diagnosis not present

## 2021-09-11 DIAGNOSIS — Z87891 Personal history of nicotine dependence: Secondary | ICD-10-CM | POA: Diagnosis not present

## 2021-09-11 DIAGNOSIS — Y9339 Activity, other involving climbing, rappelling and jumping off: Secondary | ICD-10-CM | POA: Insufficient documentation

## 2021-09-11 DIAGNOSIS — S79912A Unspecified injury of left hip, initial encounter: Secondary | ICD-10-CM | POA: Diagnosis not present

## 2021-09-11 DIAGNOSIS — W19XXXA Unspecified fall, initial encounter: Secondary | ICD-10-CM

## 2021-09-11 MED ORDER — IBUPROFEN 800 MG PO TABS: 800.0000 mg | ORAL_TABLET | Freq: Three times a day (TID) | ORAL | 0 refills | Status: AC

## 2021-09-11 MED ORDER — KETOROLAC TROMETHAMINE 60 MG/2ML IM SOLN
60.0000 mg | Freq: Once | INTRAMUSCULAR | Status: AC
  Administered 2021-09-11: 60 mg via INTRAMUSCULAR
  Filled 2021-09-11: qty 2

## 2021-09-11 NOTE — ED Provider Notes (Signed)
[facility] EMERGENCY DEPARTMENT Provider Note   CSN: [id number] Arrival date & time: 09/11/21  0249     History Chief Complaint  Patient presents with   Marvin Schroeder    Daaiel Starlin Schroeder is a 45 y.o. male.  States he was climbing on a sink and fell. His left hip hurt. Now it doesn't but it will if he moves too much. He is wheelchair bound at baseline.    Fall This is a recurrent problem. The current episode started 1 to 2 hours ago. The problem occurs constantly. The problem has not changed since onset.Pertinent negatives include no chest pain, no abdominal pain, no headaches and no shortness of breath.      Past Medical History:  Diagnosis Date   Back pain     There are no problems to display for this patient.   Past Surgical History:  Procedure Laterality Date   collarbone     FRACTURE SURGERY     HAND SURGERY     ORTHOPEDIC SURGERY     ORTHOPEDIC SURGERY     pt has metal poles in right arm. metal in pelvis , screws in hip and back of right heel.        Family History  Problem Relation Age of Onset   Hypertension Other    Cancer Other     Social History   Tobacco Use   Smoking status: Former    Packs/day: 1.00    Types: Cigarettes   Smokeless tobacco: Never  Vaping Use   Vaping Use: Never used  Substance Use Topics   Alcohol use: Not Currently    Alcohol/week: 12.0 standard drinks    Types: 12 Cans of beer per week   Drug use: Not Currently    Types: IV    Comment: heroin 6/6    Home Medications Prior to Admission medications   Medication Sig Start Date End Date Taking? Authorizing Provider  ibuprofen (ADVIL) 800 MG tablet Take 1 tablet (800 mg total) by mouth 3 (three) times daily. 09/11/21  Yes Lilyannah Zuelke, Barbara Cower, MD  cloNIDine (CATAPRES) 0.1 MG tablet Take 1 tablet (0.1 mg total) by mouth 3 (three) times daily for 1 day, THEN 1 tablet (0.1 mg total) 2 (two) times daily for 1 day, THEN 1 tablet (0.1 mg total) daily for 1 day. 03/31/20 04/03/20   Benjiman Core, MD  hydrOXYzine (ATARAX/VISTARIL) 25 MG tablet Take 1 tablet (25 mg total) by mouth every 8 (eight) hours as needed for anxiety. 03/31/20   Benjiman Core, MD  methocarbamol (ROBAXIN) 500 MG tablet Take 1 tablet (500 mg total) by mouth every 8 (eight) hours as needed for muscle spasms. 03/31/20   Benjiman Core, MD  ondansetron (ZOFRAN-ODT) 4 MG disintegrating tablet Take 1 tablet (4 mg total) by mouth every 8 (eight) hours as needed for nausea or vomiting. 03/31/20   Benjiman Core, MD    Allergies    Amoxicillin, Bee venom, Penicillins, Other, Penicillin g, and Xylocaine dental [lidocaine-epinephrine]  Review of Systems   Review of Systems  Respiratory:  Negative for shortness of breath.   Cardiovascular:  Negative for chest pain.  Gastrointestinal:  Negative for abdominal pain.  Neurological:  Negative for headaches.  All other systems reviewed and are negative.  Physical Exam Updated Vital Signs BP 129/83   Pulse 68   Temp 97.9 F (36.6 C) (Oral)   Resp 16   Ht 6\' 1"  (1.854 m)   Wt 101.2 kg   SpO2 100%  BMI 29.42 kg/m   Physical Exam Vitals and nursing note reviewed.  Constitutional:      Appearance: He is well-developed.  HENT:     Head: Normocephalic and atraumatic.     Mouth/Throat:     Mouth: Mucous membranes are moist.     Pharynx: Oropharynx is clear.  Eyes:     Pupils: Pupils are equal, round, and reactive to light.  Cardiovascular:     Rate and Rhythm: Normal rate.  Pulmonary:     Effort: Pulmonary effort is normal. No respiratory distress.  Abdominal:     General: There is no distension.  Musculoskeletal:        General: Normal range of motion.     Cervical back: Normal range of motion.     Comments: Left leg slightly short and rotated  Skin:    General: Skin is warm and dry.  Neurological:     General: No focal deficit present.     Mental Status: He is alert.    ED Results / Procedures / Treatments   Labs (all labs  ordered are listed, but only abnormal results are displayed) Labs Reviewed - No data to display  EKG None  Radiology DG Femur Min 2 Views Left  Result Date: 09/11/2021 CLINICAL DATA:  Hip pain EXAM: LEFT FEMUR 2 VIEWS COMPARISON:  08/23/2020 FINDINGS: Stable from prior. 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 Extensive spurring and fragmentation at the distorted left acetabulum. No acute fracture or acute erosion. Changes of left acetabular repair and fusion of the bilateral sacroiliac joints and symphysis pubis. No interval hardware complication seen. 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 No acute finding Electronically Signed   By: Tiburcio Pea M.D.   On: 09/11/2021 04:38    Procedures Procedures   Medications Ordered in ED Medications  ketorolac (TORADOL) injection 60 mg (60 mg Intramuscular Given 09/11/21 0457)    ED Course  I have reviewed the triage vital signs and the nursing notes.  Pertinent labs & imaging results that were available during my care of the patient were reviewed by me and considered in my medical decision making (see chart for details).    MDM Rules/Calculators/A&P                         Xr abnormal but chronic, nothing acute. Already has planned ortho follow up, will d/c for same. Ibuprofen PRN at home.   Final Clinical Impression(s) / ED Diagnoses Final diagnoses:  Fall, initial encounter  Left hip pain    Rx / DC Orders ED Discharge Orders          Ordered    ibuprofen (ADVIL) 800 MG tablet  3 times daily        09/11/21 0443             Vitoria Conyer, Barbara Cower, MD 09/11/21 [phone]
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
## 2021-09-11 NOTE — ED Notes (Signed)
Report given to Alvino Chapel, Charity fundraiser at [facility].
